# Patient Record
Sex: Female | Born: 1945 | Race: Black or African American | Hispanic: No | Marital: Married | Smoking: Never smoker
Health system: Southern US, Community
[De-identification: ages and names within clinical notes are randomized; demographics above are authoritative.]

## PROBLEM LIST (undated history)

## (undated) DIAGNOSIS — E785 Hyperlipidemia, unspecified: Secondary | ICD-10-CM

## (undated) DIAGNOSIS — I10 Essential (primary) hypertension: Secondary | ICD-10-CM

## (undated) DIAGNOSIS — M1990 Unspecified osteoarthritis, unspecified site: Secondary | ICD-10-CM

## (undated) DIAGNOSIS — K5790 Diverticulosis of intestine, part unspecified, without perforation or abscess without bleeding: Secondary | ICD-10-CM

## (undated) DIAGNOSIS — M8580 Other specified disorders of bone density and structure, unspecified site: Secondary | ICD-10-CM

## (undated) HISTORY — DX: Essential (primary) hypertension: I10

## (undated) HISTORY — DX: Other specified disorders of bone density and structure, unspecified site: M85.80

## (undated) HISTORY — DX: Hyperlipidemia, unspecified: E78.5

## (undated) HISTORY — DX: Diverticulosis of intestine, part unspecified, without perforation or abscess without bleeding: K57.90

---

## 1983-10-04 HISTORY — PX: ABDOMINAL HYSTERECTOMY: SHX81

## 2005-10-03 HISTORY — PX: CARPAL TUNNEL RELEASE: SHX101

## 2008-03-05 ENCOUNTER — Emergency Department (HOSPITAL_COMMUNITY): Admission: EM | Admit: 2008-03-05 | Discharge: 2008-03-05 | Payer: Self-pay | Admitting: Emergency Medicine

## 2008-10-03 HISTORY — PX: BREAST BIOPSY: SHX20

## 2008-10-31 ENCOUNTER — Ambulatory Visit (HOSPITAL_COMMUNITY): Admission: RE | Admit: 2008-10-31 | Discharge: 2008-10-31 | Payer: Self-pay | Admitting: Internal Medicine

## 2008-11-13 ENCOUNTER — Ambulatory Visit (HOSPITAL_COMMUNITY): Admission: RE | Admit: 2008-11-13 | Discharge: 2008-11-13 | Payer: Self-pay | Admitting: Internal Medicine

## 2008-11-17 ENCOUNTER — Encounter: Admission: RE | Admit: 2008-11-17 | Discharge: 2008-11-17 | Payer: Self-pay | Admitting: Family Medicine

## 2008-11-17 ENCOUNTER — Encounter (INDEPENDENT_AMBULATORY_CARE_PROVIDER_SITE_OTHER): Payer: Self-pay | Admitting: Diagnostic Radiology

## 2009-07-09 ENCOUNTER — Ambulatory Visit (HOSPITAL_COMMUNITY): Admission: RE | Admit: 2009-07-09 | Discharge: 2009-07-09 | Payer: Self-pay | Admitting: Family Medicine

## 2009-12-10 ENCOUNTER — Ambulatory Visit (HOSPITAL_COMMUNITY): Admission: RE | Admit: 2009-12-10 | Discharge: 2009-12-10 | Payer: Self-pay | Admitting: Family Medicine

## 2010-11-12 ENCOUNTER — Other Ambulatory Visit: Payer: Self-pay

## 2010-11-12 DIAGNOSIS — Z139 Encounter for screening, unspecified: Secondary | ICD-10-CM

## 2010-12-13 ENCOUNTER — Ambulatory Visit (HOSPITAL_COMMUNITY)
Admission: RE | Admit: 2010-12-13 | Discharge: 2010-12-13 | Disposition: A | Discharge status: Home / Self Care | Payer: Self-pay | Source: Ambulatory Visit | Attending: Family Medicine | Admitting: Family Medicine

## 2010-12-13 DIAGNOSIS — Z139 Encounter for screening, unspecified: Secondary | ICD-10-CM

## 2011-11-27 DIAGNOSIS — I10 Essential (primary) hypertension: Secondary | ICD-10-CM | POA: Diagnosis not present

## 2011-11-27 DIAGNOSIS — R51 Headache: Secondary | ICD-10-CM | POA: Diagnosis not present

## 2011-11-27 DIAGNOSIS — I9589 Other hypotension: Secondary | ICD-10-CM | POA: Diagnosis not present

## 2011-12-06 DIAGNOSIS — Z79899 Other long term (current) drug therapy: Secondary | ICD-10-CM | POA: Diagnosis not present

## 2011-12-06 DIAGNOSIS — E039 Hypothyroidism, unspecified: Secondary | ICD-10-CM | POA: Diagnosis not present

## 2011-12-06 DIAGNOSIS — I10 Essential (primary) hypertension: Secondary | ICD-10-CM | POA: Diagnosis not present

## 2011-12-06 DIAGNOSIS — M109 Gout, unspecified: Secondary | ICD-10-CM | POA: Diagnosis not present

## 2011-12-06 DIAGNOSIS — E559 Vitamin D deficiency, unspecified: Secondary | ICD-10-CM | POA: Diagnosis not present

## 2011-12-06 DIAGNOSIS — R7989 Other specified abnormal findings of blood chemistry: Secondary | ICD-10-CM | POA: Diagnosis not present

## 2011-12-06 DIAGNOSIS — E785 Hyperlipidemia, unspecified: Secondary | ICD-10-CM | POA: Diagnosis not present

## 2011-12-27 DIAGNOSIS — L0293 Carbuncle, unspecified: Secondary | ICD-10-CM | POA: Diagnosis not present

## 2011-12-27 DIAGNOSIS — L0292 Furuncle, unspecified: Secondary | ICD-10-CM | POA: Diagnosis not present

## 2011-12-27 DIAGNOSIS — M25819 Other specified joint disorders, unspecified shoulder: Secondary | ICD-10-CM | POA: Diagnosis not present

## 2011-12-30 DIAGNOSIS — L02419 Cutaneous abscess of limb, unspecified: Secondary | ICD-10-CM | POA: Diagnosis not present

## 2011-12-30 DIAGNOSIS — I10 Essential (primary) hypertension: Secondary | ICD-10-CM | POA: Diagnosis not present

## 2011-12-30 DIAGNOSIS — Z79899 Other long term (current) drug therapy: Secondary | ICD-10-CM | POA: Diagnosis not present

## 2012-01-02 DIAGNOSIS — L0292 Furuncle, unspecified: Secondary | ICD-10-CM | POA: Diagnosis not present

## 2012-01-02 DIAGNOSIS — L0293 Carbuncle, unspecified: Secondary | ICD-10-CM | POA: Diagnosis not present

## 2012-01-10 ENCOUNTER — Ambulatory Visit: Payer: Medicare Other | Attending: Family Medicine | Admitting: Physical Therapy

## 2012-01-10 DIAGNOSIS — M25519 Pain in unspecified shoulder: Secondary | ICD-10-CM | POA: Insufficient documentation

## 2012-01-10 DIAGNOSIS — IMO0001 Reserved for inherently not codable concepts without codable children: Secondary | ICD-10-CM | POA: Insufficient documentation

## 2012-01-10 DIAGNOSIS — R5381 Other malaise: Secondary | ICD-10-CM | POA: Diagnosis not present

## 2012-01-13 ENCOUNTER — Encounter: Payer: Medicare Other | Admitting: Physical Therapy

## 2012-01-16 ENCOUNTER — Encounter: Payer: Medicare Other | Admitting: Physical Therapy

## 2012-01-20 ENCOUNTER — Ambulatory Visit: Payer: Medicare Other | Admitting: Physical Therapy

## 2012-01-20 DIAGNOSIS — M25519 Pain in unspecified shoulder: Secondary | ICD-10-CM | POA: Diagnosis not present

## 2012-01-20 DIAGNOSIS — R5381 Other malaise: Secondary | ICD-10-CM | POA: Diagnosis not present

## 2012-01-20 DIAGNOSIS — IMO0001 Reserved for inherently not codable concepts without codable children: Secondary | ICD-10-CM | POA: Diagnosis not present

## 2012-01-23 ENCOUNTER — Ambulatory Visit: Payer: Medicare Other | Admitting: Physical Therapy

## 2012-01-23 DIAGNOSIS — IMO0001 Reserved for inherently not codable concepts without codable children: Secondary | ICD-10-CM | POA: Diagnosis not present

## 2012-01-23 DIAGNOSIS — R5381 Other malaise: Secondary | ICD-10-CM | POA: Diagnosis not present

## 2012-01-23 DIAGNOSIS — M25519 Pain in unspecified shoulder: Secondary | ICD-10-CM | POA: Diagnosis not present

## 2012-01-26 DIAGNOSIS — Z1231 Encounter for screening mammogram for malignant neoplasm of breast: Secondary | ICD-10-CM | POA: Diagnosis not present

## 2012-01-27 ENCOUNTER — Encounter: Payer: Medicare Other | Admitting: Physical Therapy

## 2012-02-03 ENCOUNTER — Ambulatory Visit: Payer: Medicare Other | Attending: Family Medicine | Admitting: Physical Therapy

## 2012-02-03 DIAGNOSIS — R5381 Other malaise: Secondary | ICD-10-CM | POA: Insufficient documentation

## 2012-02-03 DIAGNOSIS — IMO0001 Reserved for inherently not codable concepts without codable children: Secondary | ICD-10-CM | POA: Diagnosis not present

## 2012-02-03 DIAGNOSIS — M25519 Pain in unspecified shoulder: Secondary | ICD-10-CM | POA: Insufficient documentation

## 2012-02-06 ENCOUNTER — Ambulatory Visit: Payer: Medicare Other | Admitting: Physical Therapy

## 2012-02-10 ENCOUNTER — Ambulatory Visit: Payer: Medicare Other | Admitting: Physical Therapy

## 2012-02-13 ENCOUNTER — Encounter: Payer: Medicare Other | Admitting: Physical Therapy

## 2012-02-15 DIAGNOSIS — L0390 Cellulitis, unspecified: Secondary | ICD-10-CM | POA: Diagnosis not present

## 2012-02-15 DIAGNOSIS — L0291 Cutaneous abscess, unspecified: Secondary | ICD-10-CM | POA: Diagnosis not present

## 2012-02-17 ENCOUNTER — Encounter: Payer: Medicare Other | Admitting: Physical Therapy

## 2012-02-20 ENCOUNTER — Ambulatory Visit: Payer: Medicare Other | Admitting: Physical Therapy

## 2012-02-23 DIAGNOSIS — L0390 Cellulitis, unspecified: Secondary | ICD-10-CM | POA: Diagnosis not present

## 2012-02-23 DIAGNOSIS — L0291 Cutaneous abscess, unspecified: Secondary | ICD-10-CM | POA: Diagnosis not present

## 2012-02-24 ENCOUNTER — Ambulatory Visit: Payer: Medicare Other | Admitting: *Deleted

## 2012-03-02 ENCOUNTER — Encounter: Payer: Medicare Other | Admitting: *Deleted

## 2012-03-05 ENCOUNTER — Ambulatory Visit: Payer: Medicare Other | Attending: Family Medicine | Admitting: Physical Therapy

## 2012-03-05 DIAGNOSIS — R5381 Other malaise: Secondary | ICD-10-CM | POA: Diagnosis not present

## 2012-03-05 DIAGNOSIS — M25519 Pain in unspecified shoulder: Secondary | ICD-10-CM | POA: Diagnosis not present

## 2012-03-05 DIAGNOSIS — IMO0001 Reserved for inherently not codable concepts without codable children: Secondary | ICD-10-CM | POA: Diagnosis not present

## 2012-03-16 ENCOUNTER — Ambulatory Visit: Payer: Medicare Other | Admitting: Physical Therapy

## 2012-03-19 ENCOUNTER — Ambulatory Visit: Payer: Medicare Other | Admitting: Physical Therapy

## 2012-04-02 LAB — FECAL OCCULT BLOOD, GUAIAC: Fecal Occult Blood: NEGATIVE

## 2012-04-26 DIAGNOSIS — Z0000 Encounter for general adult medical examination without abnormal findings: Secondary | ICD-10-CM | POA: Diagnosis not present

## 2012-04-26 DIAGNOSIS — I10 Essential (primary) hypertension: Secondary | ICD-10-CM | POA: Diagnosis not present

## 2012-04-26 DIAGNOSIS — D649 Anemia, unspecified: Secondary | ICD-10-CM | POA: Diagnosis not present

## 2012-04-26 DIAGNOSIS — Z124 Encounter for screening for malignant neoplasm of cervix: Secondary | ICD-10-CM | POA: Diagnosis not present

## 2012-04-26 DIAGNOSIS — E559 Vitamin D deficiency, unspecified: Secondary | ICD-10-CM | POA: Diagnosis not present

## 2012-04-26 DIAGNOSIS — N760 Acute vaginitis: Secondary | ICD-10-CM | POA: Diagnosis not present

## 2012-04-26 DIAGNOSIS — N390 Urinary tract infection, site not specified: Secondary | ICD-10-CM | POA: Diagnosis not present

## 2012-04-26 DIAGNOSIS — E785 Hyperlipidemia, unspecified: Secondary | ICD-10-CM | POA: Diagnosis not present

## 2012-05-21 DIAGNOSIS — Z800 Family history of malignant neoplasm of digestive organs: Secondary | ICD-10-CM | POA: Diagnosis not present

## 2012-05-24 DIAGNOSIS — G5600 Carpal tunnel syndrome, unspecified upper limb: Secondary | ICD-10-CM | POA: Diagnosis not present

## 2012-05-24 DIAGNOSIS — I10 Essential (primary) hypertension: Secondary | ICD-10-CM | POA: Diagnosis not present

## 2012-05-29 DIAGNOSIS — Z1211 Encounter for screening for malignant neoplasm of colon: Secondary | ICD-10-CM | POA: Diagnosis not present

## 2012-05-29 DIAGNOSIS — E785 Hyperlipidemia, unspecified: Secondary | ICD-10-CM | POA: Diagnosis not present

## 2012-05-29 DIAGNOSIS — Z800 Family history of malignant neoplasm of digestive organs: Secondary | ICD-10-CM | POA: Diagnosis not present

## 2012-05-29 DIAGNOSIS — I10 Essential (primary) hypertension: Secondary | ICD-10-CM | POA: Diagnosis not present

## 2012-05-29 DIAGNOSIS — Z79899 Other long term (current) drug therapy: Secondary | ICD-10-CM | POA: Diagnosis not present

## 2012-05-29 DIAGNOSIS — K5730 Diverticulosis of large intestine without perforation or abscess without bleeding: Secondary | ICD-10-CM | POA: Diagnosis not present

## 2012-06-11 DIAGNOSIS — I10 Essential (primary) hypertension: Secondary | ICD-10-CM | POA: Diagnosis not present

## 2012-06-11 DIAGNOSIS — E785 Hyperlipidemia, unspecified: Secondary | ICD-10-CM | POA: Diagnosis not present

## 2012-06-12 DIAGNOSIS — E559 Vitamin D deficiency, unspecified: Secondary | ICD-10-CM | POA: Diagnosis not present

## 2012-06-12 DIAGNOSIS — E785 Hyperlipidemia, unspecified: Secondary | ICD-10-CM | POA: Diagnosis not present

## 2012-06-12 DIAGNOSIS — I10 Essential (primary) hypertension: Secondary | ICD-10-CM | POA: Diagnosis not present

## 2012-06-15 DIAGNOSIS — G609 Hereditary and idiopathic neuropathy, unspecified: Secondary | ICD-10-CM | POA: Diagnosis not present

## 2012-06-15 DIAGNOSIS — M79609 Pain in unspecified limb: Secondary | ICD-10-CM | POA: Diagnosis not present

## 2012-08-03 LAB — HM DEXA SCAN

## 2012-08-06 DIAGNOSIS — G5600 Carpal tunnel syndrome, unspecified upper limb: Secondary | ICD-10-CM | POA: Diagnosis not present

## 2012-08-06 DIAGNOSIS — I10 Essential (primary) hypertension: Secondary | ICD-10-CM | POA: Diagnosis not present

## 2012-08-07 DIAGNOSIS — Z23 Encounter for immunization: Secondary | ICD-10-CM | POA: Diagnosis not present

## 2012-08-07 DIAGNOSIS — R109 Unspecified abdominal pain: Secondary | ICD-10-CM | POA: Diagnosis not present

## 2012-08-08 DIAGNOSIS — M899 Disorder of bone, unspecified: Secondary | ICD-10-CM | POA: Diagnosis not present

## 2012-08-08 DIAGNOSIS — M949 Disorder of cartilage, unspecified: Secondary | ICD-10-CM | POA: Diagnosis not present

## 2012-10-16 DIAGNOSIS — M542 Cervicalgia: Secondary | ICD-10-CM | POA: Diagnosis not present

## 2012-10-16 DIAGNOSIS — M25519 Pain in unspecified shoulder: Secondary | ICD-10-CM | POA: Diagnosis not present

## 2013-01-03 ENCOUNTER — Telehealth: Payer: Self-pay | Admitting: Physician Assistant

## 2013-01-03 NOTE — Telephone Encounter (Signed)
appt made

## 2013-01-04 ENCOUNTER — Encounter: Payer: Self-pay | Admitting: *Deleted

## 2013-01-04 ENCOUNTER — Ambulatory Visit (INDEPENDENT_AMBULATORY_CARE_PROVIDER_SITE_OTHER): Payer: Medicare Other | Admitting: Physician Assistant

## 2013-01-04 ENCOUNTER — Other Ambulatory Visit: Payer: Self-pay | Admitting: *Deleted

## 2013-01-04 ENCOUNTER — Encounter: Payer: Self-pay | Admitting: Physician Assistant

## 2013-01-04 VITALS — BP 115/75 | HR 95 | Temp 97.8°F | Ht 64.0 in | Wt 200.0 lb

## 2013-01-04 DIAGNOSIS — M542 Cervicalgia: Secondary | ICD-10-CM

## 2013-01-04 DIAGNOSIS — Z780 Asymptomatic menopausal state: Secondary | ICD-10-CM

## 2013-01-04 MED ORDER — TRAMADOL HCL 50 MG PO TABS: 50.0000 mg | ORAL_TABLET | Freq: Three times a day (TID) | ORAL | Status: DC | PRN

## 2013-01-04 NOTE — Progress Notes (Signed)
  Subjective:    Patient ID: Ariel [name], female    DOB: 01/10/1946, 67 y.o.   MRN: [id number]  HPI Left neck and shoulder pain, making it difficult to sleep   Review of Systems  Musculoskeletal: Positive for back pain.  All other systems reviewed and are negative.       Objective:   Physical Exam  Vitals reviewed. Constitutional: She is oriented to person, place, and time. She appears well-developed and well-nourished.  HENT:  Head: Normocephalic and atraumatic.  Musculoskeletal:  Left shoulder abduction 75-80 degrees Trapezial muscle tightness  Neurological: She is alert and oriented to person, place, and time.          Assessment & Plan:  Cervicalgia - Plan: traMADol (ULTRAM) 50 MG tablet

## 2013-01-29 DIAGNOSIS — M25519 Pain in unspecified shoulder: Secondary | ICD-10-CM | POA: Diagnosis not present

## 2013-01-29 DIAGNOSIS — M6740 Ganglion, unspecified site: Secondary | ICD-10-CM | POA: Diagnosis not present

## 2013-01-29 DIAGNOSIS — G5600 Carpal tunnel syndrome, unspecified upper limb: Secondary | ICD-10-CM | POA: Diagnosis not present

## 2013-02-05 ENCOUNTER — Other Ambulatory Visit: Payer: Self-pay | Admitting: Family Medicine

## 2013-02-06 NOTE — Telephone Encounter (Signed)
LAST LABS 9/13 

## 2013-02-20 DIAGNOSIS — Z1231 Encounter for screening mammogram for malignant neoplasm of breast: Secondary | ICD-10-CM | POA: Diagnosis not present

## 2013-02-22 DIAGNOSIS — G609 Hereditary and idiopathic neuropathy, unspecified: Secondary | ICD-10-CM | POA: Diagnosis not present

## 2013-02-22 DIAGNOSIS — M79609 Pain in unspecified limb: Secondary | ICD-10-CM | POA: Diagnosis not present

## 2013-03-20 ENCOUNTER — Ambulatory Visit (INDEPENDENT_AMBULATORY_CARE_PROVIDER_SITE_OTHER): Payer: Medicare Other | Admitting: Family Medicine

## 2013-03-20 ENCOUNTER — Encounter: Payer: Self-pay | Admitting: Family Medicine

## 2013-03-20 VITALS — BP 118/76 | HR 77 | Temp 97.0°F | Wt 198.0 lb

## 2013-03-20 DIAGNOSIS — I10 Essential (primary) hypertension: Secondary | ICD-10-CM | POA: Diagnosis not present

## 2013-03-20 DIAGNOSIS — E669 Obesity, unspecified: Secondary | ICD-10-CM | POA: Insufficient documentation

## 2013-03-20 DIAGNOSIS — E8881 Metabolic syndrome: Secondary | ICD-10-CM

## 2013-03-20 DIAGNOSIS — R635 Abnormal weight gain: Secondary | ICD-10-CM | POA: Diagnosis not present

## 2013-03-20 DIAGNOSIS — E785 Hyperlipidemia, unspecified: Secondary | ICD-10-CM | POA: Insufficient documentation

## 2013-03-20 LAB — COMPLETE METABOLIC PANEL WITH GFR
ALT: 20 U/L (ref 0–35)
AST: 22 U/L (ref 0–37)
Albumin: 4.2 g/dL (ref 3.5–5.2)
Alkaline Phosphatase: 71 U/L (ref 39–117)
BUN: 16 mg/dL (ref 6–23)
CO2: 28 mEq/L (ref 19–32)
Calcium: 9.8 mg/dL (ref 8.4–10.5)
Chloride: 104 mEq/L (ref 96–112)
Creat: 0.63 mg/dL (ref 0.50–1.10)
GFR, Est African American: >89 mL/min
GFR, Est Non African American: >89 mL/min
Glucose, Bld: 94 mg/dL (ref 70–99)
Potassium: 4.7 mEq/L (ref 3.5–5.3)
Sodium: 140 mEq/L (ref 135–145)
Total Bilirubin: 0.3 mg/dL (ref 0.3–1.2)
Total Protein: 7.2 g/dL (ref 6.0–8.3)

## 2013-03-20 NOTE — Progress Notes (Signed)
Patient ID: Ariel [name], female   DOB: [birth year], 67 y.o.   MRN: [id number] SUBJECTIVE: CC: Chief Complaint  Patient presents with  . Follow-up    3 month follow up    HPI: Breakfast: toast, coffee Lunch:salad Supper: fish or chicken, green beans or greens.  1. Patient is here for follow up of hypertension:  denies Headache;deniesChest Pain;denies weakness;denies Shortness of Breath or Orthopnea;denies Visual changes;denies palpitations;denies cough;denies pedal edema;denies symptoms of TIA or stroke; admits to Compliance with medications. denies Problems with medications.  2.Patient is here for follow up of hyperlipidemia:  denies Headache;denies Chest Pain;denies weakness;denies Shortness of Breath and orthopnea;denies Visual changes;denies palpitations;denies cough;denies pedal edema;denies symptoms of TIA or stroke;deniesClaudication symptoms. admits to Compliance with medications; denies Problems with medications.    PMH/PSH: reviewed/updated in Epic  SH/FH: reviewed/updated in Epic  Allergies: reviewed/updated in Epic  Medications: reviewed/updated in Epic  Immunizations: reviewed/updated in Epic  ROS: As above in the HPI. All other systems are stable or negative.  OBJECTIVE: APPEARANCE:  Patient in no acute distress.The patient appeared well nourished and normally developed. Acyanotic. Waist: 41 3/4 inches VITAL SIGNS:BP 118/76  Pulse 77  Temp(Src) 97 F (36.1 C) (Oral)  Wt 198 lb (89.812 kg)  BMI 33.97 kg/m2 AAF  SKIN: warm and  Dry without overt rashes, tattoos and scars  HEAD and Neck: without JVD, Head and scalp: normal Eyes:No scleral icterus. Fundi normal, eye movements normal. Ears: Auricle normal, canal normal, Tympanic membranes normal, insufflation normal. Nose: normal Throat: normal Neck & thyroid: normal  CHEST & LUNGS: Chest wall: normal Lungs: Clear  [facility]: Reveals the PMI to be normally located. Regular rhythm, First and Second  Heart sounds are normal,  absence of murmurs, rubs or gallops. Peripheral vasculature: Radial pulses: normal Dorsal pedis pulses: normal Posterior pulses: normal  ABDOMEN:  Appearance:obese Benign, no organomegaly, no masses, no Abdominal Aortic enlargement. No Guarding , no rebound. No Bruits. Bowel sounds: normal  RECTAL: N/A GU: N/A  EXTREMETIES: nonedematous. Both Femoral and Pedal pulses are normal.  MUSCULOSKELETAL:  Spine: normal Joints: intact  NEUROLOGIC: oriented to time,place and person; nonfocal. Strength is normal Sensory is normal Reflexes are normal Cranial Nerves are normal.  ASSESSMENT: HTN (hypertension) - Plan: COMPLETE METABOLIC PANEL WITH GFR  HLD (hyperlipidemia) - Plan: COMPLETE METABOLIC PANEL WITH GFR, NMR Lipoprofile with Lipids  Metabolic syndrome  Obesity, unspecified   PLAN: Orders Placed This Encounter  Procedures  . COMPLETE METABOLIC PANEL WITH GFR  . NMR Lipoprofile with Lipids   Results for orders placed in visit on 01/04/13  HM DEXA SCAN      Result Value Range   HM Dexa Scan done    FECAL OCCULT BLOOD, GUAIAC      Result Value Range   Fecal Occult Blood Negative          Dr Woodroe Mode Recommendations  Diet and Exercise discussed with patient.  For nutrition information, I recommend books:  1).Eat to Live by Dr Monico Hoar. 2).Prevent and Reverse Heart Disease by Dr Suzzette Righter. 3) Dr Katherina Right Book:  Program to Reverse Diabetes  Exercise recommendations are:  If unable to walk, then the patient can exercise in a chair 3 times a day. By flapping arms like a bird gently and raising legs outwards to the front.  If ambulatory, the patient can go for walks for 30 minutes 3 times a week. Then increase the intensity and duration as tolerated.  Goal is to try to attain exercise  frequency to 5 times a week.  If applicable: Best to perform resistance exercises (machines or weights) 2 days a week and  cardio type exercises 3 days per week.  Return in about 4 months (around 07/20/2013) for Recheck medical problems.  Samar Venneman P. Modesto Charon, M.D.

## 2013-03-20 NOTE — Patient Instructions (Addendum)
      Dr Yazlynn Birkeland's Recommendations  Diet and Exercise discussed with patient.  For nutrition information, I recommend books:  1).Eat to Live by Dr Joel Fuhrman. 2).Prevent and Reverse Heart Disease by Dr Caldwell Esselstyn. 3) Dr Neal Barnard's Book:  Program to Reverse Diabetes  Exercise recommendations are:  If unable to walk, then the patient can exercise in a chair 3 times a day. By flapping arms like a bird gently and raising legs outwards to the front.  If ambulatory, the patient can go for walks for 30 minutes 3 times a week. Then increase the intensity and duration as tolerated.  Goal is to try to attain exercise frequency to 5 times a week.  If applicable: Best to perform resistance exercises (machines or weights) 2 days a week and cardio type exercises 3 days per week.  

## 2013-03-22 LAB — NMR LIPOPROFILE WITH LIPIDS
Cholesterol, Total: 156 mg/dL (ref <200)
HDL Particle Number: 37.7 umol/L (ref >=30.5)
HDL Size: 8.5 nm — ABNORMAL LOW (ref >=9.2)
HDL-C: 50 mg/dL (ref >=40)
LDL (calc): 84 mg/dL (ref <100)
LDL Particle Number: 1183 nmol/L — ABNORMAL HIGH (ref <1000)
LDL Size: 20.4 nm — ABNORMAL LOW (ref >20.5)
LP-IR Score: 69 — ABNORMAL HIGH (ref <=45)
Large HDL-P: 3.2 umol/L — ABNORMAL LOW (ref >=4.8)
Large VLDL-P: 3.2 nmol/L — ABNORMAL HIGH (ref <=2.7)
Small LDL Particle Number: 644 nmol/L — ABNORMAL HIGH (ref <=527)
Triglycerides: 110 mg/dL (ref <150)
VLDL Size: 48.1 nm — ABNORMAL HIGH (ref <=46.6)

## 2013-03-22 NOTE — Progress Notes (Signed)
Quick Note:  Lab result at goal.or close to goal. Would like to see the LDLc a little lower with dietary changes. No change in Medications for now. No Change in plans and follow up. ______

## 2013-04-03 ENCOUNTER — Other Ambulatory Visit: Payer: Self-pay | Admitting: Family Medicine

## 2013-05-06 ENCOUNTER — Encounter: Payer: Self-pay | Admitting: *Deleted

## 2013-06-17 ENCOUNTER — Encounter: Payer: Self-pay | Admitting: *Deleted

## 2013-06-19 ENCOUNTER — Telehealth: Payer: Self-pay | Admitting: Family Medicine

## 2013-06-19 NOTE — Telephone Encounter (Signed)
Pt noticed blood in her stool 2-3 days ago [facility] Appt scheduled

## 2013-06-20 ENCOUNTER — Ambulatory Visit (INDEPENDENT_AMBULATORY_CARE_PROVIDER_SITE_OTHER): Payer: Medicare Other

## 2013-06-20 ENCOUNTER — Encounter: Payer: Self-pay | Admitting: Nurse Practitioner

## 2013-06-20 ENCOUNTER — Ambulatory Visit (INDEPENDENT_AMBULATORY_CARE_PROVIDER_SITE_OTHER): Payer: Medicare Other | Admitting: Nurse Practitioner

## 2013-06-20 VITALS — BP 120/75 | HR 88 | Temp 97.8°F | Ht 64.5 in | Wt 194.0 lb

## 2013-06-20 DIAGNOSIS — D500 Iron deficiency anemia secondary to blood loss (chronic): Secondary | ICD-10-CM

## 2013-06-20 DIAGNOSIS — M25519 Pain in unspecified shoulder: Secondary | ICD-10-CM

## 2013-06-20 DIAGNOSIS — K625 Hemorrhage of anus and rectum: Secondary | ICD-10-CM | POA: Diagnosis not present

## 2013-06-20 DIAGNOSIS — M25512 Pain in left shoulder: Secondary | ICD-10-CM

## 2013-06-20 LAB — POCT HEMOGLOBIN: Hemoglobin: 11.5 g/dL — AB (ref 12.2–16.2)

## 2013-06-20 MED ORDER — ROSUVASTATIN CALCIUM 10 MG PO TABS: 10.0000 mg | ORAL_TABLET | Freq: Every day | ORAL | Status: DC

## 2013-06-20 MED ORDER — HEMOCYTE PLUS 106-1 MG PO CAPS: 1.0000 | ORAL_CAPSULE | Freq: Two times a day (BID) | ORAL | Status: DC

## 2013-06-20 NOTE — Progress Notes (Signed)
  Subjective:    Patient ID: Ariel [name], female    DOB: Mar 31, 1946, 67 y.o.   MRN: [id number]  HPI1. Patient in c/o blood in stool- noticed it 2 days ago- bright red- Had colonoscopy in August of last year which was normal- (+) diverticulosis- Dr. Teena Dunk. 2. 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    Review of Systems  All other systems reviewed and are negative.       Objective:   Physical Exam  Constitutional: She appears well-developed and well-nourished.  Cardiovascular: Normal rate, regular rhythm and normal heart sounds.   Pulmonary/Chest: Effort normal and breath sounds normal.  Abdominal: Soft. Bowel sounds are normal. She exhibits no mass. There is no tenderness. There is no rebound and no guarding.  Musculoskeletal:  FROM of left shoulder limited due to pain on ext and flexion. DTR,s = bil Motor strength and sensation intact distally  Neurological: She has normal reflexes.    BP 120/75  Pulse 88  Temp(Src) 97.8 F (36.6 C) (Oral)  Ht 5' 4.5" (1.638 m)  Wt 194 lb (87.998 kg)  BMI 32.8 kg/m2 Results for orders placed in visit on 06/20/13  POCT HEMOGLOBIN      Result Value Range   Hemoglobin 11.5 (*) 12.2 - 16.2 g/dL    BP 409/81  Pulse 88  Temp(Src) 97.8 F (36.6 C) (Oral)  Ht 5' 4.5" (1.638 m)  Wt 194 lb (87.998 kg)  BMI 32.8 kg/m2      Assessment & Plan:  1. Left shoulder pain If hurts don't do it until sees ortho - DG Shoulder Left; Future - Ambulatory referral to Orthopedic Surgery  2. Rectal bleeding Stool softners OTC - POCT hemoglobin - Ambulatory referral to Gastroenterology  3. Anemia due to chronic blood loss Recheck Hgb in 2 weeks - Fe Fum-FA-B Cmp-C-Zn-Mg-Mn-Cu (HEMOCYTE PLUS) 106-1 MG CAPS; Take 1 tablet by mouth 2 (two) times daily.  Dispense: 30 each; Refill: 2  Refilled crestor- patient to make appointment for routine  follow up  Mary-Margaret Daphine Deutscher, FNP

## 2013-06-20 NOTE — Patient Instructions (Addendum)
Left shoulder pain If hurts don't do it until sees ortho - DG Shoulder Left; Future - Ambulatory referral to Orthopedic Surgery  2. Rectal bleeding Stool softners OTC - POCT hemoglobin - Ambulatory referral to Gastroenterology  3. Anemia due to chronic blood loss Recheck Hgb in 2 weeks - Fe Fum-FA-B Cmp-C-Zn-Mg-Mn-Cu (HEMOCYTE PLUS) 106-1 MG CAPS; Take 1 tablet by mouth 2 (two) times daily.  Dispense: 30 each; Refill: 2  Refilled crestor- patient to make appointment for routine follow up  Mary-Margaret Daphine Deutscher, FNP

## 2013-06-21 ENCOUNTER — Telehealth: Payer: Self-pay | Admitting: *Deleted

## 2013-06-21 ENCOUNTER — Telehealth: Payer: Self-pay | Admitting: Nurse Practitioner

## 2013-06-21 NOTE — Telephone Encounter (Signed)
Nothing else cheaper that doesn't cause constipation- so need to go bak on oTC daily an dtake miralax if gets constipated

## 2013-06-21 NOTE — Telephone Encounter (Signed)
Pt notified that [facility] is checking on Iron supplement that will be cheaper Pt verbalizes understanding

## 2013-06-21 NOTE — Telephone Encounter (Signed)
Pt notified of results Verbalizes understanding 

## 2013-06-21 NOTE — Telephone Encounter (Signed)
Message copied by Bearl Mulberry on Fri Jun 21, 2013  5:27 PM ------      Message from: Bennie Pierini      Created: Thu Jun 20, 2013 10:27 PM       Degenerative shanges left shoulder- if doesn't get better will need ortho referral ------

## 2013-07-02 ENCOUNTER — Telehealth: Payer: Self-pay | Admitting: Nurse Practitioner

## 2013-07-04 ENCOUNTER — Ambulatory Visit: Payer: Medicare Other | Admitting: Nurse Practitioner

## 2013-07-10 ENCOUNTER — Telehealth: Payer: Self-pay | Admitting: Nurse Practitioner

## 2013-07-17 ENCOUNTER — Ambulatory Visit: Payer: Medicare Other | Admitting: Family Medicine

## 2013-07-18 ENCOUNTER — Ambulatory Visit (INDEPENDENT_AMBULATORY_CARE_PROVIDER_SITE_OTHER): Payer: Medicare Other | Admitting: Family Medicine

## 2013-07-18 ENCOUNTER — Encounter: Payer: Self-pay | Admitting: Family Medicine

## 2013-07-18 ENCOUNTER — Ambulatory Visit: Payer: Medicare Other | Admitting: Family Medicine

## 2013-07-18 VITALS — BP 146/89 | HR 90 | Temp 97.6°F | Ht 64.5 in | Wt 195.0 lb

## 2013-07-18 DIAGNOSIS — K625 Hemorrhage of anus and rectum: Secondary | ICD-10-CM | POA: Diagnosis not present

## 2013-07-18 NOTE — Progress Notes (Signed)
Patient ID: Gean Birchwood, female   DOB: 1946/06/16, 67 y.o.   MRN: [id number] Not seen Patient couldn't wait She left Oni Dietzman P. [place] Charon, M.D.

## 2013-07-23 ENCOUNTER — Ambulatory Visit: Payer: Medicare Other | Admitting: Family Medicine

## 2013-07-25 ENCOUNTER — Encounter: Payer: Self-pay | Admitting: General Practice

## 2013-07-25 ENCOUNTER — Ambulatory Visit: Payer: Medicare Other | Admitting: General Practice

## 2013-07-25 ENCOUNTER — Ambulatory Visit (INDEPENDENT_AMBULATORY_CARE_PROVIDER_SITE_OTHER): Payer: Medicare Other | Admitting: General Practice

## 2013-07-25 ENCOUNTER — Encounter (INDEPENDENT_AMBULATORY_CARE_PROVIDER_SITE_OTHER): Payer: Self-pay

## 2013-07-25 VITALS — BP 123/71 | HR 79 | Temp 97.7°F | Wt 198.0 lb

## 2013-07-25 DIAGNOSIS — E785 Hyperlipidemia, unspecified: Secondary | ICD-10-CM | POA: Diagnosis not present

## 2013-07-25 DIAGNOSIS — I10 Essential (primary) hypertension: Secondary | ICD-10-CM

## 2013-07-25 DIAGNOSIS — D649 Anemia, unspecified: Secondary | ICD-10-CM | POA: Diagnosis not present

## 2013-07-25 LAB — POCT CBC
HCT, POC: 39 % (ref 37.7–47.9)
Hemoglobin: 12.8 g/dL (ref 12.2–16.2)
MCH, POC: 27.1 pg (ref 27–31.2)
MCV: 82.5 fL (ref 80–97)
WBC: 6.2 10*3/uL (ref 4.6–10.2)

## 2013-07-25 MED ORDER — VALSARTAN-HYDROCHLOROTHIAZIDE 160-25 MG PO TABS: 1.0000 | ORAL_TABLET | Freq: Every day | ORAL | Status: DC

## 2013-07-25 MED ORDER — ROSUVASTATIN CALCIUM 10 MG PO TABS: 10.0000 mg | ORAL_TABLET | Freq: Every day | ORAL | Status: DC

## 2013-07-25 MED ORDER — VALSARTAN-HYDROCHLOROTHIAZIDE 160-25 MG PO TABS: ORAL_TABLET | ORAL | Status: DC

## 2013-07-25 NOTE — Progress Notes (Signed)
  Subjective:    Patient ID: Ariel [name], female    DOB: 29-Apr-1946, 67 y.o.   MRN: [id number]  HPI Patient presents today for follow up of low hemoglobin and chronic health condition hypertension, hyperlipidemia. She reports taking medications as directed. Reports eating healthy, but denies regular exercise. Denies checking blood pressures at home.    Review of Systems  Constitutional: Negative for fever and chills.  Respiratory: Negative for cough, chest tightness, shortness of breath and wheezing.   Cardiovascular: Negative for chest pain and palpitations.  Gastrointestinal: Negative for abdominal pain, diarrhea, constipation and blood in stool.  Genitourinary: Negative for dysuria, hematuria and difficulty urinating.  Musculoskeletal: Negative for back pain, neck pain and neck stiffness.  Neurological: Negative for dizziness, weakness and headaches.  All other systems reviewed and are negative.       Objective:   Physical Exam  Constitutional: She is oriented to person, place, and time. She appears well-developed and well-nourished.  HENT:  Head: Normocephalic and atraumatic.  Right Ear: External ear normal.  Left Ear: External ear normal.  Nose: Nose normal.  Mouth/Throat: Oropharynx is clear and moist.  Eyes: EOM are normal. Pupils are equal, round, and reactive to light.  Neck: Normal range of motion. Neck supple. No thyromegaly present.  Cardiovascular: Normal rate, regular rhythm and normal heart sounds.   Pulmonary/Chest: Effort normal and breath sounds normal. No respiratory distress. She exhibits no tenderness.  Abdominal: Soft. Bowel sounds are normal. She exhibits no distension. There is no tenderness.  Musculoskeletal: She exhibits no edema and no tenderness.  Lymphadenopathy:    She has no cervical adenopathy.  Neurological: She is alert and oriented to person, place, and time.  Skin: Skin is warm and dry.  Psychiatric: She has a normal mood and affect.           Assessment & Plan:  1. Low hemoglobin  - POCT CBC  2. Hyperlipidemia  - rosuvastatin (CRESTOR) 10 MG tablet; Take 1 tablet (10 mg total) by mouth daily.  Dispense: 30 tablet; Refill: 3 - Lipid panel  3. Hypertension  - CMP14+EGFR - valsartan-hydrochlorothiazide (DIOVAN-HCT) 160-25 MG per tablet; Take 1 tablet by mouth daily. Take 1/2 daily  Dispense: 30 tablet; Refill: 3 -Continue all current medications Labs pending F/u in 3 months Discussed exercise and diet  Patient verbalized understanding Coralie Keens, FNP-C

## 2013-07-25 NOTE — Telephone Encounter (Signed)
Seen in office today for this problem.

## 2013-07-25 NOTE — Patient Instructions (Signed)

## 2013-07-26 LAB — CMP14+EGFR
ALT: 28 IU/L (ref 0–32)
AST: 24 IU/L (ref 0–40)
Albumin: 4.5 g/dL (ref 3.6–4.8)
Alkaline Phosphatase: 81 IU/L (ref 39–117)
CO2: 28 mmol/L (ref 18–29)
Calcium: 10.2 mg/dL (ref 8.6–10.2)
Potassium: 5 mmol/L (ref 3.5–5.2)
Sodium: 139 mmol/L (ref 134–144)
Total Protein: 7.1 g/dL (ref 6.0–8.5)

## 2013-07-26 LAB — LIPID PANEL
Chol/HDL Ratio: 3.4 ratio units (ref 0.0–4.4)
Cholesterol, Total: 186 mg/dL (ref 100–199)
HDL: 55 mg/dL (ref >39)
Triglycerides: 140 mg/dL (ref 0–149)
VLDL Cholesterol Cal: 28 mg/dL (ref 5–40)

## 2013-07-31 DIAGNOSIS — M25519 Pain in unspecified shoulder: Secondary | ICD-10-CM | POA: Diagnosis not present

## 2013-08-21 DIAGNOSIS — R209 Unspecified disturbances of skin sensation: Secondary | ICD-10-CM | POA: Diagnosis not present

## 2013-08-21 DIAGNOSIS — G5600 Carpal tunnel syndrome, unspecified upper limb: Secondary | ICD-10-CM | POA: Diagnosis not present

## 2013-08-23 DIAGNOSIS — G609 Hereditary and idiopathic neuropathy, unspecified: Secondary | ICD-10-CM | POA: Diagnosis not present

## 2013-08-23 DIAGNOSIS — M79609 Pain in unspecified limb: Secondary | ICD-10-CM | POA: Diagnosis not present

## 2013-09-17 ENCOUNTER — Ambulatory Visit (INDEPENDENT_AMBULATORY_CARE_PROVIDER_SITE_OTHER): Payer: Medicare Other | Admitting: General Practice

## 2013-09-17 ENCOUNTER — Encounter: Payer: Self-pay | Admitting: General Practice

## 2013-09-17 ENCOUNTER — Telehealth: Payer: Self-pay | Admitting: General Practice

## 2013-09-17 VITALS — BP 106/64 | HR 87 | Temp 98.0°F | Ht 64.5 in | Wt 192.0 lb

## 2013-09-17 DIAGNOSIS — R05 Cough: Secondary | ICD-10-CM | POA: Diagnosis not present

## 2013-09-17 DIAGNOSIS — J101 Influenza due to other identified influenza virus with other respiratory manifestations: Secondary | ICD-10-CM

## 2013-09-17 DIAGNOSIS — L089 Local infection of the skin and subcutaneous tissue, unspecified: Secondary | ICD-10-CM

## 2013-09-17 DIAGNOSIS — J029 Acute pharyngitis, unspecified: Secondary | ICD-10-CM | POA: Diagnosis not present

## 2013-09-17 DIAGNOSIS — Z20828 Contact with and (suspected) exposure to other viral communicable diseases: Secondary | ICD-10-CM | POA: Diagnosis not present

## 2013-09-17 DIAGNOSIS — J111 Influenza due to unidentified influenza virus with other respiratory manifestations: Secondary | ICD-10-CM

## 2013-09-17 DIAGNOSIS — R059 Cough, unspecified: Secondary | ICD-10-CM

## 2013-09-17 LAB — POCT INFLUENZA A/B: Influenza A, POC: POSITIVE

## 2013-09-17 MED ORDER — CEPHALEXIN 500 MG PO CAPS: 500.0000 mg | ORAL_CAPSULE | Freq: Two times a day (BID) | ORAL | Status: DC

## 2013-09-17 MED ORDER — OSELTAMIVIR PHOSPHATE 75 MG PO CAPS: 75.0000 mg | ORAL_CAPSULE | Freq: Two times a day (BID) | ORAL | Status: DC

## 2013-09-17 NOTE — Progress Notes (Signed)
   Subjective:    Patient ID: Ariel [name], female    DOB: 06/22/1946, 67 y.o.   MRN: [id number]  Cough This is a new problem. The current episode started in the past 7 days. The problem has been unchanged. The cough is productive of sputum. Associated symptoms include nasal congestion and a sore throat. Pertinent negatives include no chest pain, chills, ear congestion, fever, headaches, myalgias, postnasal drip, shortness of breath or wheezing. Nothing aggravates the symptoms. Treatments tried: cold liquid meds. There is no history of asthma, bronchitis or pneumonia.  Reports spending time with her daughter and while visiting, her daughter was diagnosed with the flu. Also reports a boil to labia majora that has drained.     Review of Systems  Constitutional: Negative for fever and chills.  HENT: Positive for congestion and sore throat. Negative for postnasal drip, sinus pressure and sneezing.   Respiratory: Positive for cough. Negative for shortness of breath and wheezing.   Cardiovascular: Negative for chest pain and palpitations.  Gastrointestinal: Negative for abdominal pain.  Genitourinary: Negative for difficulty urinating.  Musculoskeletal: Negative for myalgias.  Neurological: Negative for dizziness, weakness and headaches.       Objective:   Physical Exam  Constitutional: She is oriented to person, place, and time. She appears well-developed and well-nourished.  HENT:  Head: Normocephalic and atraumatic.  Right Ear: External ear normal.  Left Ear: External ear normal.  Eyes: Pupils are equal, round, and reactive to light.  Cardiovascular: Normal rate, regular rhythm and normal heart sounds.   Pulmonary/Chest: Effort normal and breath sounds normal. No respiratory distress. She exhibits no tenderness.  Neurological: She is alert and oriented to person, place, and time.  Skin: Skin is warm and dry.  Psychiatric: She has a normal mood and affect.          Assessment &  Plan:  1. Sore throat  - POCT rapid strep A  2. Cough  - POCT Influenza A/B  3. Exposure to the flu  - POCT Influenza A/B  4. Influenza A  - oseltamivir (TAMIFLU) 75 MG capsule; Take 1 capsule (75 mg total) by mouth 2 (two) times daily.  Dispense: 10 capsule; Refill: 0 -increase fluids  5. Skin infection  - cephALEXin (KEFLEX) 500 MG capsule; Take 1 capsule (500 mg total) by mouth 2 (two) times daily.  Dispense: 20 capsule; Refill: 0 -keep area clean and dry -RTO if symptoms worsen or unresolved Patient verbalized understanding Coralie Keens, FNP-C

## 2013-09-17 NOTE — Patient Instructions (Signed)

## 2013-09-18 NOTE — Telephone Encounter (Signed)
Patient diagnosed with the flu and wants to know if she may still resume with sexual activities or should she wait until symptoms are improved

## 2013-09-19 ENCOUNTER — Other Ambulatory Visit: Payer: Self-pay | Admitting: General Practice

## 2013-09-30 ENCOUNTER — Telehealth: Payer: Self-pay | Admitting: General Practice

## 2013-09-30 NOTE — Telephone Encounter (Signed)
appt tomorrow with [facility]

## 2013-10-01 ENCOUNTER — Ambulatory Visit (INDEPENDENT_AMBULATORY_CARE_PROVIDER_SITE_OTHER): Payer: Medicare Other | Admitting: General Practice

## 2013-10-01 VITALS — BP 120/70 | HR 88 | Temp 97.3°F | Ht 64.0 in | Wt 190.0 lb

## 2013-10-01 DIAGNOSIS — J329 Chronic sinusitis, unspecified: Secondary | ICD-10-CM

## 2013-10-01 DIAGNOSIS — J029 Acute pharyngitis, unspecified: Secondary | ICD-10-CM | POA: Diagnosis not present

## 2013-10-01 MED ORDER — AMOXICILLIN-POT CLAVULANATE 875-125 MG PO TABS: 1.0000 | ORAL_TABLET | Freq: Two times a day (BID) | ORAL | Status: DC

## 2013-10-01 NOTE — Progress Notes (Signed)
   Subjective:    Patient ID: Ariel [name], female    DOB: [birth year], 67 y.o.   MRN: [id number]  Sore Throat  This is a new problem. The current episode started in the past 7 days. The problem has been gradually worsening. Neither side of throat is experiencing more pain than the other. There has been no fever. Associated symptoms include congestion. Pertinent negatives include no coughing, diarrhea, headaches, shortness of breath or vomiting. She has tried cool liquids for the symptoms. The treatment provided no relief.      Review of Systems  HENT: Positive for congestion, sinus pressure and sore throat.   Respiratory: Negative for cough, chest tightness and shortness of breath.   Cardiovascular: Negative for chest pain and palpitations.  Gastrointestinal: Negative for nausea, vomiting and diarrhea.  Genitourinary: Negative for difficulty urinating.  Neurological: Negative for dizziness, weakness and headaches.  All other systems reviewed and are negative.       Objective:   Physical Exam  Constitutional: She is oriented to person, place, and time. She appears well-developed and well-nourished.  HENT:  Head: Normocephalic and atraumatic.  Right Ear: External ear normal.  Left Ear: External ear normal.  Nose: Right sinus exhibits maxillary sinus tenderness and frontal sinus tenderness. Left sinus exhibits maxillary sinus tenderness and frontal sinus tenderness.  Mouth/Throat: Posterior oropharyngeal erythema present.  Cardiovascular: Normal rate, regular rhythm and normal heart sounds.   Pulmonary/Chest: Effort normal and breath sounds normal. No respiratory distress. She exhibits no tenderness.  Neurological: She is alert and oriented to person, place, and time.  Skin: Skin is warm and dry.          Assessment & Plan:  1. Acute pharyngitis  - POCT rapid strep A  2. Sinusitis  - amoxicillin-clavulanate (AUGMENTIN) 875-125 MG per tablet; Take 1 tablet by mouth 2 (two)  times daily.  Dispense: 20 tablet; Refill: 0 -increase fluids -RTO if symptoms worsen or unresolved Patient verbalized understanding Coralie Keens, FNP-C

## 2013-10-01 NOTE — Patient Instructions (Signed)

## 2013-10-02 ENCOUNTER — Ambulatory Visit (INDEPENDENT_AMBULATORY_CARE_PROVIDER_SITE_OTHER): Payer: Medicare Other | Admitting: Family Medicine

## 2013-10-02 ENCOUNTER — Encounter: Payer: Self-pay | Admitting: Family Medicine

## 2013-10-02 VITALS — BP 102/64 | HR 87 | Temp 97.4°F | Ht 64.0 in | Wt 191.4 lb

## 2013-10-02 DIAGNOSIS — H109 Unspecified conjunctivitis: Secondary | ICD-10-CM

## 2013-10-02 MED ORDER — POLYMYXIN B-TRIMETHOPRIM 10000-0.1 UNIT/ML-% OP SOLN: 2.0000 [drp] | OPHTHALMIC | Status: DC

## 2013-10-02 NOTE — Patient Instructions (Signed)

## 2013-10-02 NOTE — Progress Notes (Signed)
   Subjective:    Patient ID: Ariel [name], female    DOB: [birth year], 67 y.o.   MRN: [id number]  HPI This 67 y.o. female presents for evaluation of eye irritation, red eye, and discomfort in her eyes. She was tx for uri recently.   Review of Systems No chest pain, SOB, HA, dizziness, vision change, N/V, diarrhea, constipation, dysuria, urinary urgency or frequency, myalgias, arthralgias or rash.     Objective:   Physical Exam Vital signs noted  Well developed well nourished female.  HEENT - Head atraumatic Normocephalic                Eyes - PERRLA, Conjuctiva - Injected OU Sclera- Clear EOMI                Ears - EAC's Wnl TM's Wnl Gross Hearing WNL                Nose - Nares patent                 Throat - oropharanx wnl Respiratory - Lungs CTA bilateral Cardiac - RRR S1 and S2 without murmur        Assessment & Plan:  Conjunctivitis - Plan: trimethoprim-polymyxin b (POLYTRIM) ophthalmic solution 2 gtt's OU q 2 hours today and then tomorrow every 4 hours.  Follow up prn  Push po fluids, rest, tylenol and motrin otc prn as directed for fever, arthralgias, and myalgias.  Follow up prn if sx's continue or persist.  Deatra Canter FNP

## 2013-10-25 ENCOUNTER — Ambulatory Visit: Payer: Medicare Other | Admitting: General Practice

## 2013-12-16 ENCOUNTER — Encounter: Payer: Self-pay | Admitting: Nurse Practitioner

## 2013-12-16 ENCOUNTER — Telehealth: Payer: Self-pay | Admitting: *Deleted

## 2013-12-16 ENCOUNTER — Ambulatory Visit (INDEPENDENT_AMBULATORY_CARE_PROVIDER_SITE_OTHER): Payer: Medicare Other | Admitting: Nurse Practitioner

## 2013-12-16 VITALS — BP 123/77 | HR 84 | Temp 98.2°F | Ht 64.0 in | Wt 193.2 lb

## 2013-12-16 DIAGNOSIS — J209 Acute bronchitis, unspecified: Secondary | ICD-10-CM | POA: Diagnosis not present

## 2013-12-16 MED ORDER — BENZONATATE 100 MG PO CAPS: 100.0000 mg | ORAL_CAPSULE | Freq: Two times a day (BID) | ORAL | Status: DC | PRN

## 2013-12-16 MED ORDER — AZITHROMYCIN 250 MG PO TABS: ORAL_TABLET | ORAL | Status: DC

## 2013-12-16 NOTE — Progress Notes (Signed)
Subjective:    Patient ID: Ariel [name], female    DOB: 04/19/1946, 68 y.o.   MRN: [id number]  HPI  Patient in c/o cough and congestion- left ear started hurting this morning- All of this started late Saturday evening- Only thing she has been taking is aleve for fever.    Review of Systems  Constitutional: Positive for fever and chills.  HENT: Positive for congestion, ear pain, rhinorrhea and sinus pressure. Negative for sore throat and trouble swallowing.   Respiratory: Positive for cough (productive).   Cardiovascular: Negative.   Genitourinary: Negative.   Neurological: Negative.   Psychiatric/Behavioral: Negative.   All other systems reviewed and are negative.       Objective:   Physical Exam  Constitutional: She is oriented to person, place, and time. She appears well-developed and well-nourished.  HENT:  Right Ear: Hearing, tympanic membrane, external ear and ear canal normal.  Left Ear: Hearing, tympanic membrane, external ear and ear canal normal.  Nose: Mucosal edema and rhinorrhea present. Right sinus exhibits no maxillary sinus tenderness and no frontal sinus tenderness. Left sinus exhibits no maxillary sinus tenderness and no frontal sinus tenderness.  Mouth/Throat: Uvula is midline, oropharynx is clear and moist and mucous membranes are normal.  Eyes: Pupils are equal, round, and reactive to light.  Neck: Normal range of motion. Neck supple.  Cardiovascular: Normal rate, regular rhythm and normal heart sounds.   Pulmonary/Chest: Effort normal and breath sounds normal. No respiratory distress. She has no wheezes. She has no rales.  Deep dry cough with scattered rhonchi  Abdominal: Soft. Bowel sounds are normal.  Lymphadenopathy:    She has no cervical adenopathy.  Neurological: She is alert and oriented to person, place, and time.  Skin: Skin is warm and dry.  Psychiatric: She has a normal mood and affect. Her behavior is normal. Judgment and thought content  normal.    BP 123/77  Pulse 84  Temp(Src) 98.2 F (36.8 C) (Oral)  Ht 5\' 4"  (1.626 m)  Wt 193 lb 3.2 oz (87.635 kg)  BMI 33.15 kg/m2  SpO2 96%       Assessment & Plan:   1. Acute bronchitis    Meds ordered this encounter  Medications  . azithromycin (ZITHROMAX Z-PAK) 250 MG tablet    Sig: As directed    Dispense:  6 each    Refill:  0    Order Specific Question:  Supervising Provider    Answer:  Ernestina PennaMOORE, DONALD W [[id number]]  . benzonatate (TESSALON) 100 MG capsule    Sig: Take 1 capsule (100 mg total) by mouth 2 (two) times daily as needed for cough.    Dispense:  20 capsule    Refill:  0    Order Specific Question:  Supervising Provider    Answer:  Ernestina PennaMOORE, DONALD W [[id number]]   1. Take meds as prescribed 2. Use a cool mist humidifier especially during the winter months and when heat has been humid. 3. Use saline nose sprays frequently 4. Saline irrigations of the nose can be very helpful if done frequently.  * 4X daily for 1 week*  * Use of a nettie pot can be helpful with this. Follow directions with this* 5. Drink plenty of fluids 6. Keep thermostat turn down low 7.For any cough or congestion  Use plain Mucinex- regular strength or max strength is fine   * Children- consult with Pharmacist for dosing 8. For fever or aces or pains- take tylenol or ibuprofen appropriate  for age and weight.  * for fevers greater than 101 orally you may alternate ibuprofen and tylenol every  3 hours.   Mary-Margaret Hassell Done, FNP

## 2013-12-16 NOTE — Patient Instructions (Signed)

## 2013-12-16 NOTE — Telephone Encounter (Signed)
Ariel [name], I talked with Ariel [name] and she said she had never taken anything except crestor for her hyperlipidemia, she will have to have tried and failed at least one or maybe two preferred cholesterol meds, they are atorvastatin, lovastatin, pravastatin or simvastatin before they will cover crestor.  I explained this to her and told her that you would probably be calling her in something else and if she had any problems to please let us know and she seemed to understand and be ok with this.  Thanks Ariel [name].

## 2013-12-18 ENCOUNTER — Telehealth: Payer: Self-pay | Admitting: General Practice

## 2013-12-19 ENCOUNTER — Telehealth: Payer: Self-pay | Admitting: *Deleted

## 2013-12-19 ENCOUNTER — Other Ambulatory Visit: Payer: Self-pay | Admitting: *Deleted

## 2013-12-19 ENCOUNTER — Other Ambulatory Visit: Payer: Self-pay | Admitting: General Practice

## 2013-12-19 ENCOUNTER — Telehealth: Payer: Self-pay | Admitting: Family Medicine

## 2013-12-19 DIAGNOSIS — E785 Hyperlipidemia, unspecified: Secondary | ICD-10-CM

## 2013-12-19 MED ORDER — SIMVASTATIN 20 MG PO TABS: 20.0000 mg | ORAL_TABLET | Freq: Every day | ORAL | Status: DC

## 2013-12-19 MED ORDER — ROSUVASTATIN CALCIUM 10 MG PO TABS: 10.0000 mg | ORAL_TABLET | Freq: Every day | ORAL | Status: DC

## 2013-12-21 ENCOUNTER — Other Ambulatory Visit: Payer: Self-pay | Admitting: General Practice

## 2013-12-21 NOTE — Telephone Encounter (Signed)
Already addressed

## 2013-12-26 ENCOUNTER — Encounter: Payer: Self-pay | Admitting: Family Medicine

## 2013-12-26 ENCOUNTER — Ambulatory Visit (INDEPENDENT_AMBULATORY_CARE_PROVIDER_SITE_OTHER): Payer: Medicare Other | Admitting: Family Medicine

## 2013-12-26 ENCOUNTER — Ambulatory Visit (INDEPENDENT_AMBULATORY_CARE_PROVIDER_SITE_OTHER): Payer: Medicare Other

## 2013-12-26 VITALS — BP 126/80 | HR 88 | Temp 98.4°F | Ht 64.0 in | Wt 194.8 lb

## 2013-12-26 DIAGNOSIS — J209 Acute bronchitis, unspecified: Secondary | ICD-10-CM

## 2013-12-26 DIAGNOSIS — J329 Chronic sinusitis, unspecified: Secondary | ICD-10-CM | POA: Diagnosis not present

## 2013-12-26 DIAGNOSIS — H60399 Other infective otitis externa, unspecified ear: Secondary | ICD-10-CM | POA: Diagnosis not present

## 2013-12-26 DIAGNOSIS — R059 Cough, unspecified: Secondary | ICD-10-CM

## 2013-12-26 DIAGNOSIS — R05 Cough: Secondary | ICD-10-CM

## 2013-12-26 MED ORDER — LEVALBUTEROL HCL 1.25 MG/0.5ML IN NEBU: 1.2500 mg | INHALATION_SOLUTION | Freq: Once | RESPIRATORY_TRACT | Status: DC

## 2013-12-26 MED ORDER — LEVALBUTEROL HCL 1.25 MG/3ML IN NEBU
1.2500 mg | INHALATION_SOLUTION | Freq: Once | RESPIRATORY_TRACT | Status: AC
  Administered 2013-12-26: 1.25 mg via RESPIRATORY_TRACT

## 2013-12-26 MED ORDER — METHYLPREDNISOLONE (PAK) 4 MG PO TABS: ORAL_TABLET | ORAL | Status: DC

## 2013-12-26 MED ORDER — LEVOFLOXACIN 500 MG PO TABS: 500.0000 mg | ORAL_TABLET | Freq: Every day | ORAL | Status: DC

## 2013-12-26 MED ORDER — HYDROCODONE-HOMATROPINE 5-1.5 MG/5ML PO SYRP: 5.0000 mL | ORAL_SOLUTION | Freq: Three times a day (TID) | ORAL | Status: DC | PRN

## 2013-12-26 MED ORDER — NEOMYCIN-POLYMYXIN-HC 1 % OT SOLN: 3.0000 [drp] | Freq: Four times a day (QID) | OTIC | Status: DC

## 2013-12-26 NOTE — Progress Notes (Signed)
   Subjective:    Patient ID: Ariel [name], female    DOB: 02/18/1946, 68 y.o.   MRN: [id number]  HPI This 68 y.o. female presents for evaluation of cough and uri sx's. Patient is c/o left otalgia. She has been having some wheezing and coughing a lot at night. She is having productive Cough.   Review of Systems C/o cough No chest pain, SOB, HA, dizziness, vision change, N/V, diarrhea, constipation, dysuria, urinary urgency or frequency, myalgias, arthralgias or rash.     Objective:   Physical Exam  Vital signs noted  Well developed well nourished female.  HEENT - Head atraumatic Normocephalic                Eyes - PERRLA, Conjuctiva - clear Sclera- Clear EOMI                Ears - EAC's Wnl TM's Wnl Gross Hearing WNL                Throat - oropharanx wnl Respiratory - Lungs with expiratory wheezes left chest Cardiac - RRR S1 and S2 without murmur GI - Abdomen soft Nontender and bowel sounds active x 4.  Lungs CTA bilateral after neb tx.  CXR - No infiltrates Prelimnary reading by Angeline SlimWilliam Oxford,FNP    Assessment & Plan:  Sinusitis - Plan: methylPREDNIsolone (MEDROL DOSPACK) 4 MG tablet, HYDROcodone-homatropine (HYCODAN) 5-1.5 MG/5ML syrup, levalbuterol (XOPENEX) nebulizer solution 1.25 mg, DG Chest 2 View, levofloxacin (LEVAQUIN) 500 MG tablet  Cough - Plan: methylPREDNIsolone (MEDROL DOSPACK) 4 MG tablet, HYDROcodone-homatropine (HYCODAN) 5-1.5 MG/5ML syrup, levalbuterol (XOPENEX) nebulizer solution 1.25 mg, DG Chest 2 View, levofloxacin (LEVAQUIN) 500 MG tablet  Acute bronchitis - Plan: methylPREDNIsolone (MEDROL DOSPACK) 4 MG tablet, HYDROcodone-homatropine (HYCODAN) 5-1.5 MG/5ML syrup, levalbuterol (XOPENEX) nebulizer solution 1.25 mg, DG Chest 2 View, levofloxacin (LEVAQUIN) 500 MG tablet  Otitis, externa, infective - Plan: NEOMYCIN-POLYMYXIN-HYDROCORTISONE (CORTISPORIN) 1 % SOLN otic solution  Push po fluids, rest, tylenol and motrin otc prn as directed for fever,  arthralgias, and myalgias.  Follow up prn if sx's continue or persist.  Deatra CanterWilliam J Oxford FNP

## 2014-01-22 NOTE — Telephone Encounter (Signed)
Talked with pt regarding new med

## 2014-03-18 DIAGNOSIS — Z1231 Encounter for screening mammogram for malignant neoplasm of breast: Secondary | ICD-10-CM | POA: Diagnosis not present

## 2014-04-29 ENCOUNTER — Ambulatory Visit (INDEPENDENT_AMBULATORY_CARE_PROVIDER_SITE_OTHER): Payer: Medicare Other | Admitting: Nurse Practitioner

## 2014-04-29 ENCOUNTER — Encounter: Payer: Self-pay | Admitting: Nurse Practitioner

## 2014-04-29 VITALS — BP 128/81 | HR 85 | Temp 97.0°F | Ht 64.0 in | Wt 199.4 lb

## 2014-04-29 DIAGNOSIS — Z713 Dietary counseling and surveillance: Secondary | ICD-10-CM | POA: Diagnosis not present

## 2014-04-29 DIAGNOSIS — Z01419 Encounter for gynecological examination (general) (routine) without abnormal findings: Secondary | ICD-10-CM | POA: Diagnosis not present

## 2014-04-29 DIAGNOSIS — E669 Obesity, unspecified: Secondary | ICD-10-CM

## 2014-04-29 DIAGNOSIS — Z0000 Encounter for general adult medical examination without abnormal findings: Secondary | ICD-10-CM

## 2014-04-29 DIAGNOSIS — Z122 Encounter for screening for malignant neoplasm of respiratory organs: Secondary | ICD-10-CM | POA: Diagnosis not present

## 2014-04-29 DIAGNOSIS — I10 Essential (primary) hypertension: Secondary | ICD-10-CM

## 2014-04-29 DIAGNOSIS — E785 Hyperlipidemia, unspecified: Secondary | ICD-10-CM

## 2014-04-29 DIAGNOSIS — R635 Abnormal weight gain: Secondary | ICD-10-CM

## 2014-04-29 DIAGNOSIS — Z23 Encounter for immunization: Secondary | ICD-10-CM

## 2014-04-29 DIAGNOSIS — R87615 Unsatisfactory cytologic smear of cervix: Secondary | ICD-10-CM | POA: Diagnosis not present

## 2014-04-29 LAB — POCT URINALYSIS DIPSTICK
Bilirubin, UA: NEGATIVE
Glucose, UA: NEGATIVE
KETONES UA: NEGATIVE
Leukocytes, UA: NEGATIVE
Nitrite, UA: NEGATIVE
Protein, UA: NEGATIVE
Spec Grav, UA: 1.015
Urobilinogen, UA: NEGATIVE
pH, UA: 5

## 2014-04-29 LAB — POCT UA - MICROSCOPIC ONLY
Bacteria, U Microscopic: NEGATIVE
Casts, Ur, LPF, POC: NEGATIVE
Crystals, Ur, HPF, POC: NEGATIVE
Mucus, UA: NEGATIVE
WBC, Ur, HPF, POC: NEGATIVE
Yeast, UA: NEGATIVE

## 2014-04-29 LAB — POCT CBC
GRANULOCYTE PERCENT: 50.1 % (ref 37–80)
HCT, POC: 38.5 % (ref 37.7–47.9)
HEMOGLOBIN: 12.7 g/dL (ref 12.2–16.2)
LYMPH, POC: 2.3 (ref 0.6–3.4)
MCH, POC: 27.6 pg (ref 27–31.2)
MCHC: 32.9 g/dL (ref 31.8–35.4)
MCV: 83.8 fL (ref 80–97)
MPV: 7.2 fL (ref 0–99.8)
PLATELET COUNT, POC: 277 10*3/uL (ref 142–424)
POC GRANULOCYTE: 2.7 (ref 2–6.9)
POC LYMPH PERCENT: 42.1 %L (ref 10–50)
RBC: 4.6 M/uL (ref 4.04–5.48)
RDW, POC: 13.6 %
WBC: 5.4 10*3/uL (ref 4.6–10.2)

## 2014-04-29 MED ORDER — SIMVASTATIN 20 MG PO TABS: 20.0000 mg | ORAL_TABLET | Freq: Every day | ORAL | Status: DC

## 2014-04-29 NOTE — Progress Notes (Signed)
Subjective:    Patient ID: Ariel [name], female    DOB: [birth year], 68 y.o.   MRN: [id number]  Patient is here today for annual physical with pap.   Hypertension This is a chronic problem. The current episode started more than 1 year ago. The problem is controlled. Risk factors for coronary artery disease include obesity, post-menopausal state and dyslipidemia. Past treatments include diuretics and calcium channel blockers. The current treatment provides moderate improvement. There are no compliance problems.   Hyperlipidemia This is a chronic problem. The current episode started more than 1 year ago. Current antihyperlipidemic treatment includes statins. There are no compliance problems.  Risk factors for coronary artery disease include hypertension, post-menopausal and dyslipidemia.   *complains of left shoulder pain. She is seeing Dr. Charm Rings  in [place], her next appointment is in September. She is currently taking ibuprofen 862m po PRN.    Review of Systems  Constitutional: Negative.   HENT: Negative.   Eyes: Negative.   Respiratory: Negative.   Cardiovascular: Negative.   Gastrointestinal: Negative.   Endocrine: Negative.   Genitourinary: Negative.   Musculoskeletal:       Left shoulder pain.  Hx of carpal tunnel syndrome bil hands.   Skin: Negative.   Allergic/Immunologic: Negative.   Neurological: Negative.   Hematological: Negative.   Psychiatric/Behavioral: Negative.        Objective:   Physical Exam  Constitutional: She is oriented to person, place, and time. She appears well-developed and well-nourished.  HENT:  Head: Normocephalic.  Right Ear: Hearing, tympanic membrane, external ear and ear canal normal.  Left Ear: Hearing, tympanic membrane, external ear and ear canal normal.  Nose: Nose normal.  Mouth/Throat: Uvula is midline and oropharynx is clear and moist.  Eyes: Conjunctivae and EOM are normal. Pupils are equal, round, and reactive to light.  Neck:  Normal range of motion and full passive range of motion without pain. Neck supple. No JVD present. Carotid bruit is not present. No mass and no thyromegaly present.  Cardiovascular: Normal rate, normal heart sounds and intact distal pulses.   No murmur heard. Pulmonary/Chest: Effort normal and breath sounds normal. Right breast exhibits no inverted nipple, no mass, no nipple discharge, no skin change and no tenderness. Left breast exhibits no inverted nipple, no mass, no nipple discharge, no skin change and no tenderness.  Abdominal: Soft. Bowel sounds are normal. She exhibits no mass. There is no tenderness.  Genitourinary: Vagina normal and uterus normal. No breast swelling, tenderness, discharge or bleeding.  bimanual exam-No adnexal masses or tenderness. Vaginal cuff intact   Musculoskeletal: Normal range of motion.  Lymphadenopathy:    She has no cervical adenopathy.  Neurological: She is alert and oriented to person, place, and time.  Skin: Skin is warm and dry.  Psychiatric: She has a normal mood and affect. Her behavior is normal. Judgment and thought content normal.   BP 128/81  Pulse 85  Temp(Src) 97 F (36.1 C) (Oral)  Ht 5' 4"  (1.626 m)  Wt 199 lb 6.4 oz (90.447 kg)  BMI 34.21 kg/m2  Results for orders placed in visit on 04/29/14  POCT UA - MICROSCOPIC ONLY      Result Value Ref Range   WBC, Ur, HPF, POC neg     RBC, urine, microscopic 5-10     Bacteria, U Microscopic neg     Mucus, UA neg     Epithelial cells, urine per micros occ     Crystals, Ur, HPF, POC neg  Casts, Ur, LPF, POC neg     Yeast, UA neg    POCT URINALYSIS DIPSTICK      Result Value Ref Range   Color, UA yellow     Clarity, UA clear     Glucose, UA neg     Bilirubin, UA neg     Ketones, UA neg     Spec Grav, UA 1.015     Blood, UA mod     pH, UA 5.0     Protein, UA neg     Urobilinogen, UA negative     Nitrite, UA neg     Leukocytes, UA Negative           Assessment & Plan:   1.  Routine general medical examination at a health care facility   2. Annual physical exam   3. HLD (hyperlipidemia)   4. Essential hypertension   5. Obesity, unspecified   6. Hyperlipidemia    Orders Placed This Encounter  Procedures  . CMP14+EGFR  . NMR, lipoprofile  . POCT UA - Microscopic Only  . POCT urinalysis dipstick   Meds ordered this encounter  Medications  . ibuprofen (ADVIL,MOTRIN) 800 MG tablet    Sig: Take 800 mg by mouth daily as needed.  . simvastatin (ZOCOR) 20 MG tablet    Sig: Take 1 tablet (20 mg total) by mouth daily.    Dispense:  30 tablet    Refill:  5    Order Specific Question:  Supervising Provider    Answer:  Chipper Herb [[id number]]   zostavax today- will get pneumonia vaccine at next visit Labs pending Health maintenance reviewed Diet and exercise encouraged Continue all meds Follow up  In 3 month  [place], FNP

## 2014-04-29 NOTE — Patient Instructions (Addendum)
Exercise to Lose Weight Exercise and a healthy diet may help you lose weight. Your doctor may suggest specific exercises. EXERCISE IDEAS AND TIPS  Choose low-cost things you enjoy doing, such as walking, bicycling, or exercising to workout videos.  Take stairs instead of the elevator.  Walk during your lunch break.  Park your car further away from work or school.  Go to a gym or an exercise class.  Start with 5 to 10 minutes of exercise each day. Build up to 30 minutes of exercise 4 to 6 days a week.  Wear shoes with good support and comfortable clothes.  Stretch before and after working out.  Work out until you breathe harder and your heart beats faster.  Drink extra water when you exercise.  Do not do so much that you hurt yourself, feel dizzy, or get very short of breath. Exercises that burn about 150 calories:  Running 1  miles in 15 minutes.  Playing volleyball for 45 to 60 minutes.  Washing and waxing a car for 45 to 60 minutes.  Playing touch football for 45 minutes.  Walking 1  miles in 35 minutes.  Pushing a stroller 1  miles in 30 minutes.  Playing basketball for 30 minutes.  Raking leaves for 30 minutes.  Bicycling 5 miles in 30 minutes.  Walking 2 miles in 30 minutes.  Dancing for 30 minutes.  Shoveling snow for 15 minutes.  Swimming laps for 20 minutes.  Walking up stairs for 15 minutes.  Bicycling 4 miles in 15 minutes.  Gardening for 30 to 45 minutes.  Jumping rope for 15 minutes.  Washing windows or floors for 45 to 60 minutes. Document Released: 10/22/2010 Document Revised: 12/12/2011 Document Reviewed: 10/22/2010 Exit[employer] Patient Information 2015 [employer]. This information is not intended to replace advice given to you by your health care provider. Make sure you discuss any questions you have with your health care provider.  

## 2014-04-30 LAB — CMP14+EGFR
A/G RATIO: 1.4 (ref 1.1–2.5)
ALT: 16 IU/L (ref 0–32)
AST: 17 IU/L (ref 0–40)
Albumin: 4.3 g/dL (ref 3.6–4.8)
Alkaline Phosphatase: 88 IU/L (ref 39–117)
BUN/Creatinine Ratio: 20 (ref 11–26)
BUN: 14 mg/dL (ref 8–27)
CALCIUM: 10.2 mg/dL (ref 8.7–10.3)
CO2: 25 mmol/L (ref 18–29)
CREATININE: 0.69 mg/dL (ref 0.57–1.00)
Chloride: 99 mmol/L (ref 97–108)
GFR calc Af Amer: 104 mL/min/{1.73_m2} (ref >59)
GFR calc non Af Amer: 90 mL/min/{1.73_m2} (ref >59)
GLUCOSE: 102 mg/dL — AB (ref 65–99)
Globulin, Total: 3 g/dL (ref 1.5–4.5)
POTASSIUM: 4.4 mmol/L (ref 3.5–5.2)
Sodium: 139 mmol/L (ref 134–144)
TOTAL PROTEIN: 7.3 g/dL (ref 6.0–8.5)
Total Bilirubin: 0.2 mg/dL (ref 0.0–1.2)

## 2014-04-30 LAB — THYROID PANEL WITH TSH
FREE THYROXINE INDEX: 1.7 (ref 1.2–4.9)
T3 UPTAKE RATIO: 26 % (ref 24–39)
T4 TOTAL: 6.7 ug/dL (ref 4.5–12.0)
TSH: 1.37 u[IU]/mL (ref 0.450–4.500)

## 2014-04-30 LAB — NMR, LIPOPROFILE
Cholesterol: 229 mg/dL — ABNORMAL HIGH (ref 100–199)
HDL CHOLESTEROL BY NMR: 48 mg/dL (ref >39)
HDL Particle Number: 34.8 umol/L (ref >=30.5)
LDL Particle Number: 1776 nmol/L — ABNORMAL HIGH (ref <1000)
LDL Size: 20.4 nm (ref >20.5)
LDLC SERPL CALC-MCNC: 141 mg/dL — AB (ref 0–99)
LP-IR Score: 90 — ABNORMAL HIGH (ref <=45)
Small LDL Particle Number: 945 nmol/L — ABNORMAL HIGH (ref <=527)
Triglycerides by NMR: 199 mg/dL — ABNORMAL HIGH (ref 0–149)

## 2014-05-01 ENCOUNTER — Telehealth: Payer: Self-pay | Admitting: Nurse Practitioner

## 2014-05-01 NOTE — Telephone Encounter (Signed)
Area arround injection site is red and swollen. Explained that this sounds like a local reaction and is the most common reaction to this vaccine. Advised her to apply ice for 20 minutes at a time as often as necessary to reduce pain, swelling, and itching. She can take Benadryl and ibuprofen (if tolerated) as well. She should report any worsening of symptoms or if it does not resolve within a couple of days. Patient stated understanding and agreement to plan.

## 2014-05-02 ENCOUNTER — Other Ambulatory Visit: Payer: Self-pay | Admitting: Nurse Practitioner

## 2014-05-02 LAB — PAP IG W/ RFLX HPV ASCU: PAP SMEAR COMMENT: 0

## 2014-05-02 MED ORDER — SIMVASTATIN 40 MG PO TABS: 40.0000 mg | ORAL_TABLET | Freq: Every day | ORAL | Status: DC

## 2014-05-05 ENCOUNTER — Telehealth: Payer: Self-pay | Admitting: Nurse Practitioner

## 2014-05-05 NOTE — Telephone Encounter (Signed)
Labs were discussed with patient.

## 2014-05-19 ENCOUNTER — Telehealth: Payer: Self-pay | Admitting: Nurse Practitioner

## 2014-05-19 MED ORDER — SIMVASTATIN 40 MG PO TABS: 40.0000 mg | ORAL_TABLET | Freq: Every day | ORAL | Status: DC

## 2014-05-19 NOTE — Telephone Encounter (Signed)
Patient aware rx sent to pharmacy.  

## 2014-06-21 ENCOUNTER — Emergency Department (HOSPITAL_COMMUNITY): Payer: Medicare Other

## 2014-06-21 ENCOUNTER — Encounter (HOSPITAL_COMMUNITY): Payer: Self-pay | Admitting: Emergency Medicine

## 2014-06-21 ENCOUNTER — Emergency Department (HOSPITAL_COMMUNITY)
Admission: EM | Admit: 2014-06-21 | Discharge: 2014-06-22 | Disposition: A | Discharge status: Home / Self Care | Payer: Medicare Other | Attending: Emergency Medicine | Admitting: Emergency Medicine

## 2014-06-21 DIAGNOSIS — R110 Nausea: Secondary | ICD-10-CM | POA: Diagnosis not present

## 2014-06-21 DIAGNOSIS — I10 Essential (primary) hypertension: Secondary | ICD-10-CM | POA: Insufficient documentation

## 2014-06-21 DIAGNOSIS — Z8719 Personal history of other diseases of the digestive system: Secondary | ICD-10-CM | POA: Diagnosis not present

## 2014-06-21 DIAGNOSIS — Z8739 Personal history of other diseases of the musculoskeletal system and connective tissue: Secondary | ICD-10-CM | POA: Insufficient documentation

## 2014-06-21 DIAGNOSIS — R0602 Shortness of breath: Secondary | ICD-10-CM | POA: Diagnosis not present

## 2014-06-21 DIAGNOSIS — E785 Hyperlipidemia, unspecified: Secondary | ICD-10-CM | POA: Diagnosis not present

## 2014-06-21 DIAGNOSIS — Z79899 Other long term (current) drug therapy: Secondary | ICD-10-CM | POA: Insufficient documentation

## 2014-06-21 DIAGNOSIS — J40 Bronchitis, not specified as acute or chronic: Secondary | ICD-10-CM

## 2014-06-21 DIAGNOSIS — R079 Chest pain, unspecified: Secondary | ICD-10-CM | POA: Insufficient documentation

## 2014-06-21 DIAGNOSIS — J209 Acute bronchitis, unspecified: Secondary | ICD-10-CM | POA: Diagnosis not present

## 2014-06-21 DIAGNOSIS — R918 Other nonspecific abnormal finding of lung field: Secondary | ICD-10-CM | POA: Diagnosis not present

## 2014-06-21 LAB — I-STAT CHEM 8, ED
BUN: 15 mg/dL (ref 6–23)
Calcium, Ion: 1.2 mmol/L (ref 1.13–1.30)
Chloride: 105 mEq/L (ref 96–112)
Creatinine, Ser: 0.6 mg/dL (ref 0.50–1.10)
Glucose, Bld: 89 mg/dL (ref 70–99)
HCT: 38 % (ref 36.0–46.0)
Hemoglobin: 12.9 g/dL (ref 12.0–15.0)
POTASSIUM: 3.7 meq/L (ref 3.7–5.3)
SODIUM: 142 meq/L (ref 137–147)
TCO2: 24 mmol/L (ref 0–100)

## 2014-06-21 LAB — CBC WITH DIFFERENTIAL/PLATELET
BASOS ABS: 0 10*3/uL (ref 0.0–0.1)
Basophils Relative: 1 % (ref 0–1)
Eosinophils Absolute: 0.2 10*3/uL (ref 0.0–0.7)
Eosinophils Relative: 4 % (ref 0–5)
HCT: 36.1 % (ref 36.0–46.0)
Hemoglobin: 12.2 g/dL (ref 12.0–15.0)
Lymphocytes Relative: 37 % (ref 12–46)
Lymphs Abs: 2.1 10*3/uL (ref 0.7–4.0)
MCH: 28 pg (ref 26.0–34.0)
MCHC: 33.8 g/dL (ref 30.0–36.0)
MCV: 83 fL (ref 78.0–100.0)
Monocytes Absolute: 0.8 10*3/uL (ref 0.1–1.0)
Monocytes Relative: 14 % — ABNORMAL HIGH (ref 3–12)
Neutro Abs: 2.4 10*3/uL (ref 1.7–7.7)
Neutrophils Relative %: 44 % (ref 43–77)
Platelets: 272 10*3/uL (ref 150–400)
RBC: 4.35 MIL/uL (ref 3.87–5.11)
RDW: 13.8 % (ref 11.5–15.5)
WBC: 5.5 10*3/uL (ref 4.0–10.5)

## 2014-06-21 LAB — I-STAT TROPONIN, ED: Troponin i, poc: 0 ng/mL (ref 0.00–0.08)

## 2014-06-21 LAB — D-DIMER, QUANTITATIVE (NOT AT ARMC): D DIMER QUANT: 0.69 ug{FEU}/mL — AB (ref 0.00–0.48)

## 2014-06-21 MED ORDER — IOHEXOL 350 MG/ML SOLN
100.0000 mL | Freq: Once | INTRAVENOUS | Status: AC | PRN
  Administered 2014-06-21: 100 mL via INTRAVENOUS

## 2014-06-21 NOTE — ED Notes (Signed)
Patient transported to CT 

## 2014-06-21 NOTE — ED Notes (Signed)
Patient transported by [facility] for complaint of chest pain starting at 1130 this morning.  Patient states she has also had a cold with sinus congestion.  Patient was given 324 mg Aspirin by EMS.

## 2014-06-21 NOTE — ED Notes (Signed)
Patient transported to X-ray 

## 2014-06-21 NOTE — ED Provider Notes (Signed)
CSN: [id number]     Arrival date & time 06/21/14  2116 History   First MD Initiated Contact with Patient 06/21/14 2124     Chief Complaint  Patient presents with  . Chest Pain     (Consider location/radiation/quality/duration/timing/severity/associated sxs/prior Treatment) HPI Comments: Patient presents with chest pain. She states that last night she started having some nasal congestion and coughing. Today she started having some sharp pains in the left side of her chest. It's worse with coughing and worse with breathing. She's felt feverish but hasn't checked her temperature. She denies any productive cough. She denies any leg swelling or calf tenderness. She states that the pain is sharp and nonradiating. It's been intermittent throughout the day. It's nonexertional. She denies any history of cardiac disease.   Past Medical History  Diagnosis Date  . Hypertension   . Hyperlipidemia   . Diverticulosis   . Osteopenia    Past Surgical History  Procedure Laterality Date  . Abdominal hysterectomy  1985  . Carpal tunnel release Right 2007   Family History  Problem Relation Age of Onset  . Diabetes Mother   . Cancer Father 62    colon   History  Substance Use Topics  . Smoking status: Never Smoker   . Smokeless tobacco: Not on file  . Alcohol Use: Yes     Comment: social   OB History   Grav Para Term Preterm Abortions TAB SAB Ect Mult Living                 Review of Systems  Constitutional: Negative for fever, chills, diaphoresis and fatigue.  HENT: Positive for congestion and rhinorrhea. Negative for sneezing.   Eyes: Negative.   Respiratory: Positive for cough. Negative for chest tightness and shortness of breath.   Cardiovascular: Positive for chest pain. Negative for leg swelling.  Gastrointestinal: Positive for nausea. Negative for vomiting, abdominal pain, diarrhea and blood in stool.  Genitourinary: Negative for frequency, hematuria, flank pain and difficulty  urinating.  Musculoskeletal: Negative for arthralgias and back pain.  Skin: Negative for rash.  Neurological: Negative for dizziness, speech difficulty, weakness, numbness and headaches.      Allergies  Review of patient's allergies indicates no known allergies.  Home Medications   Prior to Admission medications   Medication Sig Start Date End Date Taking? Authorizing Provider  diphenhydrAMINE (BENADRYL) 25 MG tablet Take 25 mg by mouth every 6 (six) hours as needed for itching or allergies.   Yes Historical Provider, MD  simvastatin (ZOCOR) 40 MG tablet Take 1 tablet (40 mg total) by mouth at bedtime. 05/19/14  Yes Mary-Margaret Daphine Deutscher, FNP  valsartan-hydrochlorothiazide (DIOVAN-HCT) 160-25 MG per tablet Take 1/2 daily 07/25/13  Yes Mae Shelda Altes, FNP  levofloxacin (LEVAQUIN) 750 MG tablet Take 1 tablet (750 mg total) by mouth daily. X 6 more days 06/22/14   Rolan Bucco, MD   BP 128/76  Pulse 82  Temp(Src) 98.3 F (36.8 C) (Oral)  Resp 22  Ht  (1.626 m)  Wt 195 lb (88.451 kg)  BMI 33.46 kg/m2  SpO2 100% Physical Exam  Constitutional: She is oriented to person, place, and time. She appears well-developed and well-nourished.  HENT:  Head: Normocephalic and atraumatic.  Eyes: Pupils are equal, round, and reactive to light.  Neck: Normal range of motion. Neck supple.  Cardiovascular: Normal rate, regular rhythm and normal heart sounds.   Pulmonary/Chest: Effort normal and breath sounds normal. No respiratory distress. She has no wheezes. She  has no rales. She exhibits tenderness (mild tenderness on palpation of left chest wall).  Abdominal: Soft. Bowel sounds are normal. There is no tenderness. There is no rebound and no guarding.  Musculoskeletal: Normal range of motion. She exhibits no edema.  No calf tenderness  Lymphadenopathy:    She has no cervical adenopathy.  Neurological: She is alert and oriented to person, place, and time.  Skin: Skin is warm and dry. No  rash noted.  Psychiatric: She has a normal mood and affect.    ED Course  Procedures (including critical care time) Labs Review Results for orders placed during the hospital encounter of 06/21/14  CBC WITH DIFFERENTIAL      Result Value Ref Range   WBC 5.5  4.0 - 10.5 K/uL   RBC 4.35  3.87 - 5.11 MIL/uL   Hemoglobin 12.2  12.0 - 15.0 g/dL   HCT 16.1  09.6 - 04.5 %   MCV 83.0  78.0 - 100.0 fL   MCH 28.0  26.0 - 34.0 pg   MCHC 33.8  30.0 - 36.0 g/dL   RDW 40.9  81.1 - 91.4 %   Platelets 272  150 - 400 K/uL   Neutrophils Relative % 44  43 - 77 %   Neutro Abs 2.4  1.7 - 7.7 K/uL   Lymphocytes Relative 37  12 - 46 %   Lymphs Abs 2.1  0.7 - 4.0 K/uL   Monocytes Relative 14 (*) 3 - 12 %   Monocytes Absolute 0.8  0.1 - 1.0 K/uL   Eosinophils Relative 4  0 - 5 %   Eosinophils Absolute 0.2  0.0 - 0.7 K/uL   Basophils Relative 1  0 - 1 %   Basophils Absolute 0.0  0.0 - 0.1 K/uL  D-DIMER, QUANTITATIVE      Result Value Ref Range   D-Dimer, Quant 0.69 (*) 0.00 - 0.48 ug/mL-FEU  I-STAT CHEM 8, ED      Result Value Ref Range   Sodium 142  137 - 147 mEq/L   Potassium 3.7  3.7 - 5.3 mEq/L   Chloride 105  96 - 112 mEq/L   BUN 15  6 - 23 mg/dL   Creatinine, Ser 7.82  0.50 - 1.10 mg/dL   Glucose, Bld 89  70 - 99 mg/dL   Calcium, Ion 9.56  2.13 - 1.30 mmol/L   TCO2 24  0 - 100 mmol/L   Hemoglobin 12.9  12.0 - 15.0 g/dL   HCT 08.6  57.8 - 46.9 %  I-STAT TROPOININ, ED      Result Value Ref Range   Troponin i, poc 0.00  0.00 - 0.08 ng/mL   Comment 3            Dg Chest 2 View  06/21/2014   CLINICAL DATA:  Chest pain, shortness of breath.  EXAM: CHEST  2 VIEW  COMPARISON:  Chest radiograph October 16, 2012  FINDINGS: Pain Cardiomediastinal silhouette is unremarkable. The lungs are clear without pleural effusions or focal consolidations. Trachea projects midline and there is no pneumothorax. Soft tissue planes and included osseous structures are non-suspicious.  IMPRESSION: No acute  cardiopulmonary process.   Electronically Signed   By: Awilda Metro   On: 06/21/2014 22:20      Imaging Review Dg Chest 2 View  06/21/2014   CLINICAL DATA:  Chest pain, shortness of breath.  EXAM: CHEST  2 VIEW  COMPARISON:  Chest radiograph October 16, 2012  FINDINGS: Pain  Cardiomediastinal silhouette is unremarkable. The lungs are clear without pleural effusions or focal consolidations. Trachea projects midline and there is no pneumothorax. Soft tissue planes and included osseous structures are non-suspicious.  IMPRESSION: No acute cardiopulmonary process.   Electronically Signed   By: Awilda Metro   On: 06/21/2014 22:20   Ct Angio Chest Pe W/cm &/or Wo Cm  06/22/2014   CLINICAL DATA:  Left-sided chest pain  EXAM: CT ANGIOGRAPHY CHEST WITH CONTRAST  TECHNIQUE: Multidetector CT imaging of the chest was performed using the standard protocol during bolus administration of intravenous contrast. Multiplanar CT image reconstructions and MIPs were obtained to evaluate the vascular anatomy.  CONTRAST:  OMNIPAQUE IOHEXOL 350 MG/ML SOLN  COMPARISON:  None.  FINDINGS: There are no filling defects within the pulmonary arteries to suggest acute pulmonary embolism. No acute findings of the aorta or great vessels. No pericardial fluid. Esophagus.  Review of the lung parenchyma demonstrates mild diffuse ground-glass opacities and interlobular septal thickening. No pleural fluid.  No axillary supraclavicular adenopathy.  No mediastinal.  No free fluid the pelvis.  No aggressive osseous lesion  Review of the MIP images confirms the above findings.  IMPRESSION: 1. No evidence acute pulmonary embolism. 2. Mild ground-glass opacities suggest mild pulmonary edema or atelectasis.   Electronically Signed   By: Genevive Bi M.D.   On: 06/22/2014 00:01     EKG Interpretation   Date/Time:  Saturday June 21 2014 21:24:09 EDT Ventricular Rate:  80 PR Interval:  157 QRS Duration: 141 QT Interval:   410 QTC Calculation: 473 R Axis:   -46 Text Interpretation:  Sinus rhythm Left bundle branch block No old tracing  to compare Confirmed by Ireanna Finlayson  MD, Volney Reierson ([id number]) on 06/21/2014 9:26:28 PM      MDM   Final diagnoses:  Bronchitis    Pt presents with nasal congestion, coughing, left side sharp chest pain.  Pain is reproducible on palpation.  Does not sound consistent with ACS.  No PE seen.  sats 100% on RA.  Will d/c.  Start abx for probable bronchitis.    Rolan Bucco, MD 06/22/14 [phone]

## 2014-06-22 ENCOUNTER — Telehealth (HOSPITAL_COMMUNITY): Payer: Self-pay

## 2014-06-22 DIAGNOSIS — J40 Bronchitis, not specified as acute or chronic: Secondary | ICD-10-CM | POA: Diagnosis not present

## 2014-06-22 MED ORDER — LEVOFLOXACIN 750 MG PO TABS: 750.0000 mg | ORAL_TABLET | Freq: Every day | ORAL | Status: DC

## 2014-06-22 MED ORDER — LEVOFLOXACIN 750 MG PO TABS
750.0000 mg | ORAL_TABLET | Freq: Once | ORAL | Status: AC
  Administered 2014-06-22: 750 mg via ORAL
  Filled 2014-06-22: qty 1

## 2014-06-22 NOTE — Discharge Instructions (Signed)
Upper Respiratory Infection, Adult An upper respiratory infection (URI) is also sometimes known as the common cold. The upper respiratory tract includes the nose, sinuses, throat, trachea, and bronchi. Bronchi are the airways leading to the lungs. Most people improve within 1 week, but symptoms can last up to 2 weeks. A residual cough may last even longer.  CAUSES Many different viruses can infect the tissues lining the upper respiratory tract. The tissues become irritated and inflamed and often become very moist. Mucus production is also common. A cold is contagious. You can easily spread the virus to others by oral contact. This includes kissing, sharing a glass, coughing, or sneezing. Touching your mouth or nose and then touching a surface, which is then touched by another person, can also spread the virus. SYMPTOMS  Symptoms typically develop 1 to 3 days after you come in contact with a cold virus. Symptoms vary from person to person. They may include:  Runny nose.  Sneezing.  Nasal congestion.  Sinus irritation.  Sore throat.  Loss of voice (laryngitis).  Cough.  Fatigue.  Muscle aches.  Loss of appetite.  Headache.  Low-grade fever. DIAGNOSIS  You might diagnose your own cold based on familiar symptoms, since most people get a cold 2 to 3 times a year. Your caregiver can confirm this based on your exam. Most importantly, your caregiver can check that your symptoms are not due to another disease such as strep throat, sinusitis, pneumonia, asthma, or epiglottitis. Blood tests, throat tests, and X-rays are not necessary to diagnose a common cold, but they may sometimes be helpful in excluding other more serious diseases. Your caregiver will decide if any further tests are required. RISKS AND COMPLICATIONS  You may be at risk for a more severe case of the common cold if you smoke cigarettes, have chronic heart disease (such as heart failure) or lung disease (such as asthma), or if  you have a weakened immune system. The very young and very old are also at risk for more serious infections. Bacterial sinusitis, middle ear infections, and bacterial pneumonia can complicate the common cold. The common cold can worsen asthma and chronic obstructive pulmonary disease (COPD). Sometimes, these complications can require emergency medical care and may be life-threatening. PREVENTION  The best way to protect against getting a cold is to practice good hygiene. Avoid oral or hand contact with people with cold symptoms. Wash your hands often if contact occurs. There is no clear evidence that vitamin C, vitamin E, echinacea, or exercise reduces the chance of developing a cold. However, it is always recommended to get plenty of rest and practice good nutrition. TREATMENT  Treatment is directed at relieving symptoms. There is no cure. Antibiotics are not effective, because the infection is caused by a virus, not by bacteria. Treatment may include:  Increased fluid intake. Sports drinks offer valuable electrolytes, sugars, and fluids.  Breathing heated mist or steam (vaporizer or shower).  Eating chicken soup or other clear broths, and maintaining good nutrition.  Getting plenty of rest.  Using gargles or lozenges for comfort.  Controlling fevers with ibuprofen or acetaminophen as directed by your caregiver.  Increasing usage of your inhaler if you have asthma. Zinc gel and zinc lozenges, taken in the first 24 hours of the common cold, can shorten the duration and lessen the severity of symptoms. Pain medicines may help with fever, muscle aches, and throat pain. A variety of non-prescription medicines are available to treat congestion and runny nose. Your caregiver   can make recommendations and may suggest nasal or lung inhalers for other symptoms.  HOME CARE INSTRUCTIONS   Only take over-the-counter or prescription medicines for pain, discomfort, or fever as directed by your  caregiver.  Use a warm mist humidifier or inhale steam from a shower to increase air moisture. This may keep secretions moist and make it easier to breathe.  Drink enough water and fluids to keep your urine clear or pale yellow.  Rest as needed.  Return to work when your temperature has returned to normal or as your caregiver advises. You may need to stay home longer to avoid infecting others. You can also use a face mask and careful hand washing to prevent spread of the virus. SEEK MEDICAL CARE IF:   After the first few days, you feel you are getting worse rather than better.  You need your caregiver's advice about medicines to control symptoms.  You develop chills, worsening shortness of breath, or brown or red sputum. These may be signs of pneumonia.  You develop yellow or brown nasal discharge or pain in the face, especially when you bend forward. These may be signs of sinusitis.  You develop a fever, swollen neck glands, pain with swallowing, or white areas in the back of your throat. These may be signs of strep throat. SEEK IMMEDIATE MEDICAL CARE IF:   You have a fever.  You develop severe or persistent headache, ear pain, sinus pain, or chest pain.  You develop wheezing, a prolonged cough, cough up blood, or have a change in your usual mucus (if you have chronic lung disease).  You develop sore muscles or a stiff neck. Document Released: 03/15/2001 Document Revised: 12/12/2011 Document Reviewed: 12/25/2013 Exit[employer] Patient Information 2015 [employer]. This information is not intended to replace advice given to you by your health care provider. Make sure you discuss any questions you have with your health care provider.  

## 2014-06-25 ENCOUNTER — Ambulatory Visit (INDEPENDENT_AMBULATORY_CARE_PROVIDER_SITE_OTHER): Payer: Medicare Other | Admitting: Nurse Practitioner

## 2014-06-25 ENCOUNTER — Encounter: Payer: Self-pay | Admitting: Nurse Practitioner

## 2014-06-25 VITALS — BP 123/76 | HR 97 | Temp 97.9°F | Ht 64.0 in | Wt 200.0 lb

## 2014-06-25 DIAGNOSIS — Z124 Encounter for screening for malignant neoplasm of cervix: Secondary | ICD-10-CM | POA: Diagnosis not present

## 2014-06-25 DIAGNOSIS — Z01419 Encounter for gynecological examination (general) (routine) without abnormal findings: Secondary | ICD-10-CM

## 2014-06-25 LAB — POCT URINALYSIS DIPSTICK
Bilirubin, UA: NEGATIVE
GLUCOSE UA: NEGATIVE
Ketones, UA: NEGATIVE
Nitrite, UA: NEGATIVE
SPEC GRAV UA: 1.025
Urobilinogen, UA: NEGATIVE
pH, UA: 5

## 2014-06-25 LAB — POCT UA - MICROSCOPIC ONLY
CASTS, UR, LPF, POC: NEGATIVE
CRYSTALS, UR, HPF, POC: NEGATIVE
Mucus, UA: NEGATIVE
Yeast, UA: NEGATIVE

## 2014-06-25 NOTE — Progress Notes (Signed)
   Subjective:    Patient ID: Ariel [name], female    DOB: November 16, 1945, 68 y.o.   MRN: [id number]  HPI Patient is in today for repeat PAP- not enough tissue for test when done several weeks ago .    Review of Systems  All other systems reviewed and are negative.      Objective:   Physical Exam  Constitutional: She is oriented to person, place, and time. She appears well-developed and well-nourished.  Cardiovascular: Normal rate, regular rhythm and normal heart sounds.   Pulmonary/Chest: Effort normal and breath sounds normal.  Genitourinary:  Vaginal cuff intact No adnexal mass or tenderness  Neurological: She is alert and oriented to person, place, and time.  Skin: Skin is warm and dry.  Psychiatric: She has a normal mood and affect. Her behavior is normal. Judgment and thought content normal.    BP 123/76  Pulse 97  Temp(Src) 97.9 F (36.6 C) (Oral)  Ht  (1.626 m)  Wt 200 lb (90.719 kg)  BMI 34.31 kg/m2       Assessment & Plan:   1. Encounter for routine gynecological examination   2. Pap smear for cervical cancer screening    Will call with results  Mary-Margaret Daphine Deutscher, FNP

## 2014-06-25 NOTE — Patient Instructions (Signed)
Pap Test A Pap test is a procedure done in a clinic office to evaluate cells that are on the surface of the cervix. The cervix is the lower portion of the uterus and upper portion of the vagina. For some women, the cervical region has the potential to form cancer. With consistent evaluations by your caregiver, this type of cancer can be prevented.  If a Pap test is abnormal, it is most often a result of a previous exposure to human papillomavirus (HPV). HPV is a virus that can infect the cells of the cervix and cause dysplasia. Dysplasia is where the cells no longer look normal. If a woman has been diagnosed with high-grade or severe dysplasia, they are at higher risk of developing cervical cancer. People diagnosed with low-grade dysplasia should still be seen by their caregiver because there is a small chance that low-grade dysplasia could develop into cancer.  LET YOUR CAREGIVER KNOW ABOUT:  Recent sexually transmitted infection (STI) you have had.  Any new sex partners you have had.  History of previous abnormal Pap tests results.  History of previous cervical procedures you have had (colposcopy, biopsy, loop electrosurgical excision procedure [LEEP]).  Concerns you have had regarding unusual vaginal discharge.  History of pelvic pain.  Your use of birth control. BEFORE THE PROCEDURE  Ask your caregiver when to schedule your Pap test. It is best not to be on your period if your caregiver uses a wooden spatula to collect cells or applies cells to a glass slide. Newer techniques are not so sensitive to the timing of a menstrual cycle.  Do not douche or have sexual intercourse for 24 hours before the test.   Do not use vaginal creams or tampons for 24 hours before the test.   Empty your bladder just before the test to lessen any discomfort.  PROCEDURE You will lie on an exam table with your feet in stirrups. A warm metal or plastic instrument (speculum) is placed in your vagina. This  instrument allows your caregiver to see the inside of your vagina and look at your cervix. A small, plastic brush or wooden spatula is then used to collect cervical cells. These cells are placed in a lab specimen container. The cells are looked at under a microscope. A specialist will determine if the cells are normal.  AFTER THE PROCEDURE Make sure to get your test results.If your results come back abnormal, you may need further testing.  Document Released: 12/10/2002 Document Revised: 12/12/2011 Document Reviewed: 09/15/2011 ExitCare Patient Information 2015 [employer]. This information is not intended to replace advice given to you by your health care provider. Make sure you discuss any questions you have with your health care provider.  

## 2014-06-26 ENCOUNTER — Other Ambulatory Visit: Payer: Medicare Other

## 2014-06-26 ENCOUNTER — Other Ambulatory Visit: Payer: Self-pay | Admitting: Orthopedic Surgery

## 2014-06-26 ENCOUNTER — Ambulatory Visit (INDEPENDENT_AMBULATORY_CARE_PROVIDER_SITE_OTHER): Payer: Medicare Other

## 2014-06-26 DIAGNOSIS — R52 Pain, unspecified: Secondary | ICD-10-CM

## 2014-06-26 DIAGNOSIS — M1990 Unspecified osteoarthritis, unspecified site: Secondary | ICD-10-CM | POA: Diagnosis not present

## 2014-06-26 DIAGNOSIS — M25519 Pain in unspecified shoulder: Secondary | ICD-10-CM | POA: Diagnosis not present

## 2014-06-26 LAB — PAP IG (IMAGE GUIDED): PAP Smear Comment: 0

## 2014-06-27 ENCOUNTER — Telehealth: Payer: Self-pay | Admitting: Nurse Practitioner

## 2014-06-27 MED ORDER — IBUPROFEN 800 MG PO TABS: 800.0000 mg | ORAL_TABLET | Freq: Three times a day (TID) | ORAL | Status: DC | PRN

## 2014-06-27 NOTE — Telephone Encounter (Signed)
I sent the Rx to the pharmacy.

## 2014-06-28 NOTE — Telephone Encounter (Signed)
Patient aware rx sent into pharmacy. 

## 2014-07-22 ENCOUNTER — Ambulatory Visit: Payer: Medicare Other

## 2014-08-06 ENCOUNTER — Ambulatory Visit: Payer: Medicare Other

## 2014-08-19 ENCOUNTER — Ambulatory Visit: Payer: Medicare Other

## 2014-08-25 ENCOUNTER — Telehealth: Payer: Self-pay | Admitting: Nurse Practitioner

## 2014-08-25 NOTE — Telephone Encounter (Signed)
Stp advised pap results wnl, pt voiced understanding.

## 2014-09-01 ENCOUNTER — Telehealth: Payer: Self-pay | Admitting: *Deleted

## 2014-09-01 ENCOUNTER — Telehealth: Payer: Self-pay | Admitting: Nurse Practitioner

## 2014-09-01 DIAGNOSIS — I10 Essential (primary) hypertension: Secondary | ICD-10-CM

## 2014-09-01 MED ORDER — IBUPROFEN 800 MG PO TABS: 800.0000 mg | ORAL_TABLET | Freq: Three times a day (TID) | ORAL | Status: DC | PRN

## 2014-09-01 MED ORDER — SIMVASTATIN 40 MG PO TABS: 40.0000 mg | ORAL_TABLET | Freq: Every day | ORAL | Status: DC

## 2014-09-01 MED ORDER — VALSARTAN-HYDROCHLOROTHIAZIDE 160-25 MG PO TABS: ORAL_TABLET | ORAL | Status: DC

## 2014-09-01 NOTE — Telephone Encounter (Signed)
rx sent to pharmacy Patient NTBS for follow up and lab work   

## 2014-09-01 NOTE — Telephone Encounter (Signed)
Requesting refills.  Last visits were 04-29-14 for annual and 06-25-14 for gyn. Please advise.

## 2014-09-01 NOTE — Telephone Encounter (Signed)
Aware, scripts sent in and NTBS.

## 2014-10-21 ENCOUNTER — Other Ambulatory Visit: Payer: Self-pay | Admitting: Nurse Practitioner

## 2014-10-21 MED ORDER — SIMVASTATIN 40 MG PO TABS: 40.0000 mg | ORAL_TABLET | Freq: Every day | ORAL | Status: DC

## 2014-10-21 NOTE — Telephone Encounter (Signed)
done

## 2014-10-22 ENCOUNTER — Telehealth: Payer: Self-pay | Admitting: Nurse Practitioner

## 2014-10-22 DIAGNOSIS — I10 Essential (primary) hypertension: Secondary | ICD-10-CM

## 2014-10-22 MED ORDER — VALSARTAN-HYDROCHLOROTHIAZIDE 160-25 MG PO TABS: ORAL_TABLET | ORAL | Status: DC

## 2014-10-22 NOTE — Telephone Encounter (Signed)
Done 10/21/14, pt aware

## 2014-10-22 NOTE — Telephone Encounter (Signed)
done

## 2014-11-21 ENCOUNTER — Ambulatory Visit: Payer: Medicare Other | Admitting: Nurse Practitioner

## 2014-12-08 ENCOUNTER — Ambulatory Visit (INDEPENDENT_AMBULATORY_CARE_PROVIDER_SITE_OTHER): Payer: Medicare Other | Admitting: Nurse Practitioner

## 2014-12-08 ENCOUNTER — Encounter: Payer: Self-pay | Admitting: Nurse Practitioner

## 2014-12-08 VITALS — BP 112/72 | HR 77 | Temp 97.0°F | Ht 64.0 in | Wt 203.0 lb

## 2014-12-08 DIAGNOSIS — E785 Hyperlipidemia, unspecified: Secondary | ICD-10-CM

## 2014-12-08 DIAGNOSIS — Z23 Encounter for immunization: Secondary | ICD-10-CM

## 2014-12-08 DIAGNOSIS — E8881 Metabolic syndrome: Secondary | ICD-10-CM

## 2014-12-08 DIAGNOSIS — E669 Obesity, unspecified: Secondary | ICD-10-CM | POA: Diagnosis not present

## 2014-12-08 DIAGNOSIS — I10 Essential (primary) hypertension: Secondary | ICD-10-CM | POA: Diagnosis not present

## 2014-12-08 MED ORDER — SIMVASTATIN 40 MG PO TABS: 40.0000 mg | ORAL_TABLET | Freq: Every day | ORAL | Status: DC

## 2014-12-08 MED ORDER — VALSARTAN-HYDROCHLOROTHIAZIDE 160-25 MG PO TABS: ORAL_TABLET | ORAL | Status: DC

## 2014-12-08 MED ORDER — IBUPROFEN 800 MG PO TABS: 800.0000 mg | ORAL_TABLET | Freq: Three times a day (TID) | ORAL | Status: DC | PRN

## 2014-12-08 NOTE — Patient Instructions (Signed)

## 2014-12-08 NOTE — Addendum Note (Signed)
Addended by: Cleda DaubUCKER, Dequante Tremaine G on: 12/08/2014 11:46 AM   Modules accepted: Orders

## 2014-12-08 NOTE — Progress Notes (Signed)
   Subjective:    Patient ID: Ariel [name], female    DOB: 1946-09-22, 69 y.o.   MRN: [id number]  Hypertension This is a chronic problem. The current episode started more than 1 year ago. The problem is unchanged. The problem is controlled. Pertinent negatives include no chest pain, headaches, orthopnea, palpitations or shortness of breath. Risk factors for coronary artery disease include dyslipidemia, obesity, post-menopausal state and sedentary lifestyle. Past treatments include ACE inhibitors and diuretics. The current treatment provides moderate improvement. Compliance problems include diet and exercise.  There is no history of CAD/MI or CVA.  Hyperlipidemia This is a chronic problem. The current episode started more than 1 year ago. The problem is uncontrolled. Recent lipid tests were reviewed and are normal. Exacerbating diseases include obesity. She has no history of diabetes or hypothyroidism. Pertinent negatives include no chest pain or shortness of breath. Current antihyperlipidemic treatment includes statins. The current treatment provides moderate improvement of lipids. Compliance problems include adherence to diet and adherence to exercise.  Risk factors for coronary artery disease include dyslipidemia, hypertension, post-menopausal and obesity.  metabolic syndrome Trying to watch diet- has nit had hgba1c drawn- does not check blood sugars at home.   Review of Systems  Constitutional: Negative.   HENT: Negative.   Respiratory: Negative for shortness of breath.   Cardiovascular: Negative for chest pain, palpitations and orthopnea.  Neurological: Negative.  Negative for headaches.  Psychiatric/Behavioral: Negative.   All other systems reviewed and are negative.      Objective:   Physical Exam  Constitutional: She is oriented to person, place, and time. She appears well-developed and well-nourished.  HENT:  Nose: Nose normal.  Mouth/Throat: Oropharynx is clear and moist.  Eyes:  EOM are normal.  Neck: Trachea normal, normal range of motion and full passive range of motion without pain. Neck supple. No JVD present. Carotid bruit is not present. No thyromegaly present.  Cardiovascular: Normal rate, regular rhythm, normal heart sounds and intact distal pulses.  Exam reveals no gallop and no friction rub.   No murmur heard. Pulmonary/Chest: Effort normal and breath sounds normal.  Abdominal: Soft. Bowel sounds are normal. She exhibits no distension and no mass. There is no tenderness.  Musculoskeletal: Normal range of motion.  Lymphadenopathy:    She has no cervical adenopathy.  Neurological: She is alert and oriented to person, place, and time. She has normal reflexes.  Skin: Skin is warm and dry.  Psychiatric: She has a normal mood and affect. Her behavior is normal. Judgment and thought content normal.   BP 112/72 mmHg  Pulse 77  Temp(Src) 97 F (36.1 C) (Oral)  Ht _0  (1.626 m)  Wt 203 lb (92.08 kg)  BMI 34.83 kg/m2        Assessment & Plan:  1. Essential hypertension Do not add salt to diet - valsartan-hydrochlorothiazide (DIOVAN-HCT) 160-25 MG per tablet; Take 1/2 daily  Dispense: 30 tablet; Refill: 2 - CMP14+EGFR  2. HLD (hyperlipidemia) Low fat diet - simvastatin (ZOCOR) 40 MG tablet; Take 1 tablet (40 mg total) by mouth at bedtime.  Dispense: 30 tablet; Refill: 5 - NMR, lipoprofile  3. Metabolic syndrome    4. Obesity Discussed diet and exercise for person with BMI >25 Will recheck weight in 3-6 months     Labs pending Health maintenance reviewed Diet and exercise encouraged Continue all meds Follow up  In 3 months  [place], FNP

## 2014-12-09 LAB — CMP14+EGFR
A/G RATIO: 1.5 (ref 1.1–2.5)
ALT: 15 IU/L (ref 0–32)
AST: 17 IU/L (ref 0–40)
Albumin: 4.3 g/dL (ref 3.6–4.8)
Alkaline Phosphatase: 88 IU/L (ref 39–117)
BUN/Creatinine Ratio: 20 (ref 11–26)
BUN: 12 mg/dL (ref 8–27)
Bilirubin Total: <0.2 mg/dL (ref 0.0–1.2)
CALCIUM: 9.8 mg/dL (ref 8.7–10.3)
CHLORIDE: 101 mmol/L (ref 97–108)
CO2: 25 mmol/L (ref 18–29)
Creatinine, Ser: 0.6 mg/dL (ref 0.57–1.00)
GFR calc Af Amer: 108 mL/min/{1.73_m2} (ref >59)
GFR, EST NON AFRICAN AMERICAN: 94 mL/min/{1.73_m2} (ref >59)
GLUCOSE: 100 mg/dL — AB (ref 65–99)
Globulin, Total: 2.8 g/dL (ref 1.5–4.5)
POTASSIUM: 4.4 mmol/L (ref 3.5–5.2)
Sodium: 139 mmol/L (ref 134–144)
Total Protein: 7.1 g/dL (ref 6.0–8.5)

## 2014-12-09 LAB — NMR, LIPOPROFILE
CHOLESTEROL: 202 mg/dL — AB (ref 100–199)
HDL CHOLESTEROL BY NMR: 56 mg/dL (ref >39)
HDL Particle Number: 38.2 umol/L (ref >=30.5)
LDL PARTICLE NUMBER: 1440 nmol/L — AB (ref <1000)
LDL Size: 21.1 nm (ref >20.5)
LDL-C: 124 mg/dL — AB (ref 0–99)
LP-IR Score: 69 — ABNORMAL HIGH (ref <=45)
Small LDL Particle Number: 510 nmol/L (ref <=527)
Triglycerides by NMR: 111 mg/dL (ref 0–149)

## 2014-12-10 ENCOUNTER — Telehealth: Payer: Self-pay | Admitting: Nurse Practitioner

## 2014-12-10 DIAGNOSIS — I10 Essential (primary) hypertension: Secondary | ICD-10-CM

## 2014-12-10 DIAGNOSIS — E785 Hyperlipidemia, unspecified: Secondary | ICD-10-CM

## 2014-12-10 MED ORDER — VALSARTAN-HYDROCHLOROTHIAZIDE 160-25 MG PO TABS: ORAL_TABLET | ORAL | Status: DC

## 2014-12-10 MED ORDER — SIMVASTATIN 40 MG PO TABS: 40.0000 mg | ORAL_TABLET | Freq: Every day | ORAL | Status: DC

## 2014-12-10 NOTE — Telephone Encounter (Signed)
RXs sent into [facility] Pt notified

## 2014-12-22 DIAGNOSIS — M792 Neuralgia and neuritis, unspecified: Secondary | ICD-10-CM | POA: Diagnosis not present

## 2014-12-22 DIAGNOSIS — G5600 Carpal tunnel syndrome, unspecified upper limb: Secondary | ICD-10-CM | POA: Diagnosis not present

## 2014-12-22 DIAGNOSIS — R201 Hypoesthesia of skin: Secondary | ICD-10-CM | POA: Diagnosis not present

## 2015-02-25 ENCOUNTER — Other Ambulatory Visit: Payer: Self-pay | Admitting: Nurse Practitioner

## 2015-02-25 NOTE — Telephone Encounter (Signed)
NTBS.

## 2015-02-26 NOTE — Telephone Encounter (Signed)
Patient aware that she will need an appointment. Appointment given for Tuesday with Paulene FloorMary Martin, FNP

## 2015-03-03 ENCOUNTER — Ambulatory Visit: Payer: Medicare Other | Admitting: Nurse Practitioner

## 2015-03-13 DIAGNOSIS — G5610 Other lesions of median nerve, unspecified upper limb: Secondary | ICD-10-CM | POA: Diagnosis not present

## 2015-03-13 DIAGNOSIS — M79603 Pain in arm, unspecified: Secondary | ICD-10-CM | POA: Diagnosis not present

## 2015-03-23 DIAGNOSIS — Z1231 Encounter for screening mammogram for malignant neoplasm of breast: Secondary | ICD-10-CM | POA: Diagnosis not present

## 2015-03-24 DIAGNOSIS — Z1231 Encounter for screening mammogram for malignant neoplasm of breast: Secondary | ICD-10-CM | POA: Diagnosis not present

## 2015-04-13 ENCOUNTER — Encounter: Payer: Self-pay | Admitting: Family Medicine

## 2015-04-15 ENCOUNTER — Telehealth: Payer: Self-pay | Admitting: Family

## 2015-04-15 ENCOUNTER — Encounter: Payer: Self-pay | Admitting: Family

## 2015-04-15 ENCOUNTER — Ambulatory Visit (INDEPENDENT_AMBULATORY_CARE_PROVIDER_SITE_OTHER): Payer: Medicare Other | Admitting: Family

## 2015-04-15 VITALS — BP 121/77 | HR 81 | Temp 98.2°F | Ht 64.0 in | Wt 200.4 lb

## 2015-04-15 DIAGNOSIS — J069 Acute upper respiratory infection, unspecified: Secondary | ICD-10-CM

## 2015-04-15 MED ORDER — BENZONATATE 200 MG PO CAPS: 200.0000 mg | ORAL_CAPSULE | Freq: Three times a day (TID) | ORAL | Status: DC | PRN

## 2015-04-15 MED ORDER — HYDROCODONE-HOMATROPINE 5-1.5 MG/5ML PO SYRP: 5.0000 mL | ORAL_SOLUTION | Freq: Three times a day (TID) | ORAL | Status: DC | PRN

## 2015-04-15 MED ORDER — AZITHROMYCIN 250 MG PO TABS: ORAL_TABLET | ORAL | Status: DC

## 2015-04-15 NOTE — Telephone Encounter (Signed)
Patient advised that there is nothing to replace the tessalon pearles and I advised patient she could try robitussin, delsym, or mucinex otc.

## 2015-04-15 NOTE — Patient Instructions (Addendum)
Upper Respiratory Infection, Adult An upper respiratory infection (URI) is also sometimes known as the common cold. The upper respiratory tract includes the nose, sinuses, throat, trachea, and bronchi. Bronchi are the airways leading to the lungs. Most people improve within 1 week, but symptoms can last up to 2 weeks. A residual cough may last even longer.  CAUSES Many different viruses can infect the tissues lining the upper respiratory tract. The tissues become irritated and inflamed and often become very moist. Mucus production is also common. A cold is contagious. You can easily spread the virus to others by oral contact. This includes kissing, sharing a glass, coughing, or sneezing. Touching your mouth or nose and then touching a surface, which is then touched by another person, can also spread the virus. SYMPTOMS  Symptoms typically develop 1 to 3 days after you come in contact with a cold virus. Symptoms vary from person to person. They may include:  Runny nose.  Sneezing.  Nasal congestion.  Sinus irritation.  Sore throat.  Loss of voice (laryngitis).  Cough.  Fatigue.  Muscle aches.  Loss of appetite.  Headache.  Low-grade fever. DIAGNOSIS  You might diagnose your own cold based on familiar symptoms, since most people get a cold 2 to 3 times a year. Your caregiver can confirm this based on your exam. Most importantly, your caregiver can check that your symptoms are not due to another disease such as strep throat, sinusitis, pneumonia, asthma, or epiglottitis. Blood tests, throat tests, and X-rays are not necessary to diagnose a common cold, but they may sometimes be helpful in excluding other more serious diseases. Your caregiver will decide if any further tests are required. RISKS AND COMPLICATIONS  You may be at risk for a more severe case of the common cold if you smoke cigarettes, have chronic heart disease (such as heart failure) or lung disease (such as asthma), or if  you have a weakened immune system. The very young and very old are also at risk for more serious infections. Bacterial sinusitis, middle ear infections, and bacterial pneumonia can complicate the common cold. The common cold can worsen asthma and chronic obstructive pulmonary disease (COPD). Sometimes, these complications can require emergency medical care and may be life-threatening. PREVENTION  The best way to protect against getting a cold is to practice good hygiene. Avoid oral or hand contact with people with cold symptoms. Wash your hands often if contact occurs. There is no clear evidence that vitamin C, vitamin E, echinacea, or exercise reduces the chance of developing a cold. However, it is always recommended to get plenty of rest and practice good nutrition. TREATMENT  Treatment is directed at relieving symptoms. There is no cure. Antibiotics are not effective, because the infection is caused by a virus, not by bacteria. Treatment may include:  Increased fluid intake. Sports drinks offer valuable electrolytes, sugars, and fluids.  Breathing heated mist or steam (vaporizer or shower).  Eating chicken soup or other clear broths, and maintaining good nutrition.  Getting plenty of rest.  Using gargles or lozenges for comfort.  Controlling fevers with ibuprofen or acetaminophen as directed by your caregiver.  Increasing usage of your inhaler if you have asthma. Zinc gel and zinc lozenges, taken in the first 24 hours of the common cold, can shorten the duration and lessen the severity of symptoms. Pain medicines may help with fever, muscle aches, and throat pain. A variety of non-prescription medicines are available to treat congestion and runny nose. Your caregiver   can make recommendations and may suggest nasal or lung inhalers for other symptoms.  HOME CARE INSTRUCTIONS   Only take over-the-counter or prescription medicines for pain, discomfort, or fever as directed by your  caregiver.  Use a warm mist humidifier or inhale steam from a shower to increase air moisture. This may keep secretions moist and make it easier to breathe.  Drink enough water and fluids to keep your urine clear or pale yellow.  Rest as needed.  Return to work when your temperature has returned to normal or as your caregiver advises. You may need to stay home longer to avoid infecting others. You can also use a face mask and careful hand washing to prevent spread of the virus. SEEK MEDICAL CARE IF:   After the first few days, you feel you are getting worse rather than better.  You need your caregiver's advice about medicines to control symptoms.  You develop chills, worsening shortness of breath, or brown or red sputum. These may be signs of pneumonia.  You develop yellow or brown nasal discharge or pain in the face, especially when you bend forward. These may be signs of sinusitis.  You develop a fever, swollen neck glands, pain with swallowing, or white areas in the back of your throat. These may be signs of strep throat. SEEK IMMEDIATE MEDICAL CARE IF:   You have a fever.  You develop severe or persistent headache, ear pain, sinus pain, or chest pain.  You develop wheezing, a prolonged cough, cough up blood, or have a change in your usual mucus (if you have chronic lung disease).  You develop sore muscles or a stiff neck. Document Released: 03/15/2001 Document Revised: 12/12/2011 Document Reviewed: 12/25/2013 [employer] Patient Information 2015 [employer]. This information is not intended to replace advice given to you by your health care provider. Make sure you discuss any questions you have with your health care provider.  - Take meds as prescribed - Use a cool mist humidifier  -Use saline nose sprays frequently -Saline irrigations of the nose can be very helpful if done frequently.  * 4X daily for 1 week*  * Use of a nettie pot can be helpful with this. Follow  directions with this* -Force fluids -For any cough or congestion  Use plain Mucinex- regular strength or max strength is fine   * Children- consult with Pharmacist for dosing -For fever or aces or pains- take tylenol or ibuprofen appropriate for age and weight.  * for fevers greater than 101 orally you may alternate ibuprofen and tylenol every  3 hours. -Throat lozenges if help   Huriel Matt, FNP   

## 2015-04-15 NOTE — Progress Notes (Signed)
Subjective:    Patient ID: Ariel [name], female    DOB: 12/08/1945, 69 y.o.   MRN: [id number]  Cough This is a new problem. The current episode started in the past 7 days (Sunday). The problem has been gradually worsening. The problem occurs every few minutes. The cough is non-productive. Associated symptoms include chills, a fever, headaches, nasal congestion, postnasal drip, rhinorrhea, shortness of breath and wheezing. Pertinent negatives include no ear congestion, ear pain, hemoptysis, myalgias or sore throat. The symptoms are aggravated by lying down. She has tried rest (benadryl) for the symptoms. The treatment provided mild relief. There is no history of asthma or COPD.      Review of Systems  Constitutional: Positive for fever and chills.  HENT: Positive for postnasal drip and rhinorrhea. Negative for ear pain and sore throat.   Eyes: Negative.   Respiratory: Positive for cough, shortness of breath and wheezing. Negative for hemoptysis.   Cardiovascular: Negative.  Negative for palpitations.  Gastrointestinal: Negative.   Endocrine: Negative.   Genitourinary: Negative.   Musculoskeletal: Negative.  Negative for myalgias.  Neurological: Positive for headaches.  Hematological: Negative.   Psychiatric/Behavioral: Negative.   All other systems reviewed and are negative.      Objective:   Physical Exam  Constitutional: She is oriented to person, place, and time. She appears well-developed and well-nourished. No distress.  HENT:  Head: Normocephalic and atraumatic.  Right Ear: External ear normal.  Left Ear: External ear normal.  Nasal passage erythemas with mild swelling  Oropharynx erythemas  Eyes: Pupils are equal, round, and reactive to light.  Neck: Normal range of motion. Neck supple. No thyromegaly present.  Cardiovascular: Normal rate, regular rhythm, normal heart sounds and intact distal pulses.   No murmur heard. Pulmonary/Chest: Effort normal and breath sounds  normal. No respiratory distress. She has no wheezes.  Abdominal: Soft. Bowel sounds are normal. She exhibits no distension. There is no tenderness.  Musculoskeletal: Normal range of motion. She exhibits no edema or tenderness.  Neurological: She is alert and oriented to person, place, and time. She has normal reflexes. No cranial nerve deficit.  Skin: Skin is warm and dry.  Psychiatric: She has a normal mood and affect. Her behavior is normal. Judgment and thought content normal.  Vitals reviewed.     BP 121/77 mmHg  Pulse 81  Temp(Src) 98.2 F (36.8 C) (Oral)  Ht 5\' 4"  (1.626 m)  Wt 200 lb 6.4 oz (90.901 kg)  BMI 34.38 kg/m2     Assessment & Plan:  1. Acute upper respiratory infection -- Take meds as prescribed - Use a cool mist humidifier  -Use saline nose sprays frequently -Saline irrigations of the nose can be very helpful if done frequently.  * 4X daily for 1 week*  * Use of a nettie pot can be helpful with this. Follow directions with this* -Force fluids -For any cough or congestion  Use plain Mucinex- regular strength or max strength is fine   * Children- consult with Pharmacist for dosing -For fever or aces or pains- take tylenol or ibuprofen appropriate for age and weight.  * for fevers greater than 101 orally you may alternate ibuprofen and tylenol every  3 hours. -Throat lozenges if help - azithromycin (ZITHROMAX) 250 MG tablet; Take 500 mg once, then 250 mg for four days  Dispense: 6 tablet; Refill: 0 - benzonatate (TESSALON) 200 MG capsule; Take 1 capsule (200 mg total) by mouth 3 (three) times daily as needed.  Dispense: 30 capsule; Refill: 1 - HYDROcodone-homatropine (HYCODAN) 5-1.5 MG/5ML syrup; Take 5 mLs by mouth every 8 (eight) hours as needed for cough.  Dispense: 120 mL; Refill: 0  Evelina Dun, FNP

## 2015-04-16 DIAGNOSIS — M19012 Primary osteoarthritis, left shoulder: Secondary | ICD-10-CM | POA: Diagnosis not present

## 2015-04-16 DIAGNOSIS — G8929 Other chronic pain: Secondary | ICD-10-CM | POA: Diagnosis not present

## 2015-04-16 DIAGNOSIS — M25512 Pain in left shoulder: Secondary | ICD-10-CM | POA: Diagnosis not present

## 2015-04-17 ENCOUNTER — Other Ambulatory Visit (HOSPITAL_COMMUNITY): Payer: Self-pay | Admitting: Orthopedic Surgery

## 2015-04-17 DIAGNOSIS — M25512 Pain in left shoulder: Secondary | ICD-10-CM

## 2015-04-28 ENCOUNTER — Ambulatory Visit (HOSPITAL_COMMUNITY): Payer: Medicare Other

## 2015-05-01 ENCOUNTER — Ambulatory Visit: Payer: Medicare Other | Admitting: Physician Assistant

## 2015-05-04 ENCOUNTER — Encounter: Payer: Self-pay | Admitting: Family

## 2015-05-04 ENCOUNTER — Ambulatory Visit (INDEPENDENT_AMBULATORY_CARE_PROVIDER_SITE_OTHER): Payer: Medicare Other | Admitting: Family

## 2015-05-04 VITALS — BP 117/78 | HR 78 | Temp 97.1°F | Ht 64.0 in | Wt 196.4 lb

## 2015-05-04 DIAGNOSIS — J069 Acute upper respiratory infection, unspecified: Secondary | ICD-10-CM | POA: Diagnosis not present

## 2015-05-04 MED ORDER — METHYLPREDNISOLONE ACETATE 80 MG/ML IJ SUSP
80.0000 mg | Freq: Once | INTRAMUSCULAR | Status: AC
  Administered 2015-05-04: 80 mg via INTRAMUSCULAR

## 2015-05-04 NOTE — Progress Notes (Signed)
   Subjective:    Patient ID: Ariel [name], female    DOB: 08/03/1946, 69 y.o.   MRN: [id number]  Pt presents to the office for URI that has not resolved.  Cough This is a recurrent problem. The current episode started 1 to 4 weeks ago. The problem has been waxing and waning. The problem occurs every few minutes. The cough is productive of sputum and productive of brown sputum. Associated symptoms include headaches, nasal congestion, rhinorrhea, shortness of breath, sweats and wheezing. Pertinent negatives include no chills, ear congestion, ear pain or fever. The symptoms are aggravated by lying down. She has tried rest (Zpak) for the symptoms. The treatment provided mild relief. There is no history of asthma or COPD.      Review of Systems  Constitutional: Negative.  Negative for fever and chills.  HENT: Positive for rhinorrhea. Negative for ear pain.   Eyes: Negative.   Respiratory: Positive for cough, shortness of breath and wheezing.   Cardiovascular: Negative.  Negative for palpitations.  Gastrointestinal: Negative.   Endocrine: Negative.   Genitourinary: Negative.   Musculoskeletal: Negative.   Neurological: Positive for headaches.  Hematological: Negative.   Psychiatric/Behavioral: Negative.   All other systems reviewed and are negative.      Objective:   Physical Exam  Constitutional: She is oriented to person, place, and time. She appears well-developed and well-nourished. No distress.  HENT:  Head: Normocephalic and atraumatic.  Right Ear: External ear normal.  Left Ear: External ear normal.  Nose: Nose normal.  Oropharynx erythemas   Eyes: Pupils are equal, round, and reactive to light.  Neck: Normal range of motion. Neck supple. No thyromegaly present.  Cardiovascular: Normal rate, regular rhythm, normal heart sounds and intact distal pulses.   No murmur heard. Pulmonary/Chest: Effort normal and breath sounds normal. No respiratory distress. She has no wheezes.    Nonproductive cough   Abdominal: Soft. Bowel sounds are normal. She exhibits no distension. There is no tenderness.  Musculoskeletal: Normal range of motion. She exhibits no edema or tenderness.  Neurological: She is alert and oriented to person, place, and time. She has normal reflexes. No cranial nerve deficit.  Skin: Skin is warm and dry.  Psychiatric: She has a normal mood and affect. Her behavior is normal. Judgment and thought content normal.  Vitals reviewed.   BP 117/78 mmHg  Pulse 78  Temp(Src) 97.1 F (36.2 C) (Oral)  Ht  (1.626 m)  Wt 196 lb 6.4 oz (89.086 kg)  BMI 33.70 kg/m2       Assessment & Plan:  1. Acute upper respiratory infection -- Take meds as prescribed - Use a cool mist humidifier  -Use saline nose sprays frequently -Saline irrigations of the nose can be very helpful if done frequently.  * 4X daily for 1 week*  * Use of a nettie pot can be helpful with this. Follow directions with this* -Force fluids -For any cough or congestion  Use plain Mucinex- regular strength or max strength is fine   * Children- consult with Pharmacist for dosing -For fever or aces or pains- take tylenol or ibuprofen appropriate for age and weight.  * for fevers greater than 101 orally you may alternate ibuprofen and tylenol every  3 hours. -Throat lozenges if help - methylPREDNISolone acetate (DEPO-MEDROL) injection 80 mg; Inject 1 mL (80 mg total) into the muscle once.  Jannifer Rodney, FNP

## 2015-05-04 NOTE — Patient Instructions (Signed)
Upper Respiratory Infection, Adult An upper respiratory infection (URI) is also sometimes known as the common cold. The upper respiratory tract includes the nose, sinuses, throat, trachea, and bronchi. Bronchi are the airways leading to the lungs. Most people improve within 1 week, but symptoms can last up to 2 weeks. A residual cough may last even longer.  CAUSES Many different viruses can infect the tissues lining the upper respiratory tract. The tissues become irritated and inflamed and often become very moist. Mucus production is also common. A cold is contagious. You can easily spread the virus to others by oral contact. This includes kissing, sharing a glass, coughing, or sneezing. Touching your mouth or nose and then touching a surface, which is then touched by another person, can also spread the virus. SYMPTOMS  Symptoms typically develop 1 to 3 days after you come in contact with a cold virus. Symptoms vary from person to person. They may include:  Runny nose.  Sneezing.  Nasal congestion.  Sinus irritation.  Sore throat.  Loss of voice (laryngitis).  Cough.  Fatigue.  Muscle aches.  Loss of appetite.  Headache.  Low-grade fever. DIAGNOSIS  You might diagnose your own cold based on familiar symptoms, since most people get a cold 2 to 3 times a year. Your caregiver can confirm this based on your exam. Most importantly, your caregiver can check that your symptoms are not due to another disease such as strep throat, sinusitis, pneumonia, asthma, or epiglottitis. Blood tests, throat tests, and X-rays are not necessary to diagnose a common cold, but they may sometimes be helpful in excluding other more serious diseases. Your caregiver will decide if any further tests are required. RISKS AND COMPLICATIONS  You may be at risk for a more severe case of the common cold if you smoke cigarettes, have chronic heart disease (such as heart failure) or lung disease (such as asthma), or if  you have a weakened immune system. The very young and very old are also at risk for more serious infections. Bacterial sinusitis, middle ear infections, and bacterial pneumonia can complicate the common cold. The common cold can worsen asthma and chronic obstructive pulmonary disease (COPD). Sometimes, these complications can require emergency medical care and may be life-threatening. PREVENTION  The best way to protect against getting a cold is to practice good hygiene. Avoid oral or hand contact with people with cold symptoms. Wash your hands often if contact occurs. There is no clear evidence that vitamin C, vitamin E, echinacea, or exercise reduces the chance of developing a cold. However, it is always recommended to get plenty of rest and practice good nutrition. TREATMENT  Treatment is directed at relieving symptoms. There is no cure. Antibiotics are not effective, because the infection is caused by a virus, not by bacteria. Treatment may include:  Increased fluid intake. Sports drinks offer valuable electrolytes, sugars, and fluids.  Breathing heated mist or steam (vaporizer or shower).  Eating chicken soup or other clear broths, and maintaining good nutrition.  Getting plenty of rest.  Using gargles or lozenges for comfort.  Controlling fevers with ibuprofen or acetaminophen as directed by your caregiver.  Increasing usage of your inhaler if you have asthma. Zinc gel and zinc lozenges, taken in the first 24 hours of the common cold, can shorten the duration and lessen the severity of symptoms. Pain medicines may help with fever, muscle aches, and throat pain. A variety of non-prescription medicines are available to treat congestion and runny nose. Your caregiver   can make recommendations and may suggest nasal or lung inhalers for other symptoms.  HOME CARE INSTRUCTIONS   Only take over-the-counter or prescription medicines for pain, discomfort, or fever as directed by your  caregiver.  Use a warm mist humidifier or inhale steam from a shower to increase air moisture. This may keep secretions moist and make it easier to breathe.  Drink enough water and fluids to keep your urine clear or pale yellow.  Rest as needed.  Return to work when your temperature has returned to normal or as your caregiver advises. You may need to stay home longer to avoid infecting others. You can also use a face mask and careful hand washing to prevent spread of the virus. SEEK MEDICAL CARE IF:   After the first few days, you feel you are getting worse rather than better.  You need your caregiver's advice about medicines to control symptoms.  You develop chills, worsening shortness of breath, or brown or red sputum. These may be signs of pneumonia.  You develop yellow or brown nasal discharge or pain in the face, especially when you bend forward. These may be signs of sinusitis.  You develop a fever, swollen neck glands, pain with swallowing, or white areas in the back of your throat. These may be signs of strep throat. SEEK IMMEDIATE MEDICAL CARE IF:   You have a fever.  You develop severe or persistent headache, ear pain, sinus pain, or chest pain.  You develop wheezing, a prolonged cough, cough up blood, or have a change in your usual mucus (if you have chronic lung disease).  You develop sore muscles or a stiff neck. Document Released: 03/15/2001 Document Revised: 12/12/2011 Document Reviewed: 12/25/2013 Exit[employer] Patient Information 2015 [employer]. This information is not intended to replace advice given to you by your health care provider. Make sure you discuss any questions you have with your health care provider.  - Take meds as prescribed - Use a cool mist humidifier  -Use saline nose sprays frequently -Saline irrigations of the nose can be very helpful if done frequently.  * 4X daily for 1 week*  * Use of a nettie pot can be helpful with this. Follow  directions with this* -Force fluids -For any cough or congestion  Use plain Mucinex- regular strength or max strength is fine   * Children- consult with Pharmacist for dosing -For fever or aces or pains- take tylenol or ibuprofen appropriate for age and weight.  * for fevers greater than 101 orally you may alternate ibuprofen and tylenol every  3 hours. -Throat lozenges if help   Ariel Bunyan, FNP   

## 2015-05-08 ENCOUNTER — Other Ambulatory Visit (HOSPITAL_COMMUNITY): Payer: Medicare Other

## 2015-06-30 ENCOUNTER — Ambulatory Visit: Payer: Medicare Other | Admitting: Pediatrics

## 2015-07-20 ENCOUNTER — Ambulatory Visit: Payer: Medicare Other | Admitting: Pediatrics

## 2015-07-28 ENCOUNTER — Ambulatory Visit: Payer: Medicare Other | Admitting: Pediatrics

## 2015-08-19 ENCOUNTER — Telehealth: Payer: Self-pay | Admitting: Nurse Practitioner

## 2015-09-02 ENCOUNTER — Ambulatory Visit (INDEPENDENT_AMBULATORY_CARE_PROVIDER_SITE_OTHER): Payer: Medicare Other | Admitting: Family Medicine

## 2015-09-02 ENCOUNTER — Encounter: Payer: Self-pay | Admitting: Family Medicine

## 2015-09-02 VITALS — BP 127/75 | HR 81 | Temp 98.1°F | Ht 64.0 in | Wt 203.0 lb

## 2015-09-02 DIAGNOSIS — H109 Unspecified conjunctivitis: Secondary | ICD-10-CM | POA: Diagnosis not present

## 2015-09-02 MED ORDER — TOBRAMYCIN-DEXAMETHASONE 0.3-0.1 % OP SUSP: OPHTHALMIC | Status: DC

## 2015-09-02 NOTE — Progress Notes (Signed)
Subjective:  Patient ID: Ariel [name], female    DOB: 08/25/1946  Age: 69 y.o. MRN: [id number]  CC: eye irritation   HPI [place] Chilton SiGreen presents for 2 days of irritation in the eyes. It seems to be getting worse. It has not affected her vision. She is tearing some and has some mucoid discharge as well. Primarily however it's just watery and painful. She has had a little bit of nasal congestion and sore throat but no fever chills or sweats or other signs of respiratory illness.  History Aubrianna has a past medical history of Hypertension; Hyperlipidemia; Diverticulosis; and Osteopenia.   She has past surgical history that includes Abdominal hysterectomy (1985) and Carpal tunnel release (Right, 2007).   Her family history includes Cancer (age of onset: 4980) in her father; Diabetes in her mother.She reports that she has never smoked. She does not have any smokeless tobacco history on file. She reports that she drinks alcohol. She reports that she does not use illicit drugs.  Current Outpatient Prescriptions on File Prior to Visit  Medication Sig Dispense Refill  . ibuprofen (ADVIL,MOTRIN) 800 MG tablet Take 1 tablet (800 mg total) by mouth every 8 (eight) hours as needed. 30 tablet 1  . simvastatin (ZOCOR) 40 MG tablet Take 1 tablet (40 mg total) by mouth at bedtime. 90 tablet 1  . traMADol (ULTRAM) 50 MG tablet Take by mouth every 6 (six) hours as needed. for pain.  0  . valsartan-hydrochlorothiazide (DIOVAN-HCT) 160-25 MG per tablet Take 1/2 daily 90 tablet 1   Current Facility-Administered Medications on File Prior to Visit  Medication Dose Route Frequency Provider Last Rate Last Dose  . levalbuterol (XOPENEX) nebulizer solution 1.25 mg  1.25 mg Nebulization Once Deatra CanterWilliam J Oxford, FNP   1.25 mg at 12/26/13 1445    ROS Review of Systems  Constitutional: Negative for fever, chills, diaphoresis, appetite change and fatigue.  HENT: Negative for congestion, ear pain, hearing loss, postnasal  drip, rhinorrhea, sore throat and trouble swallowing.   Eyes: Positive for pain, discharge and redness.  Respiratory: Negative for cough.   Musculoskeletal: Negative for arthralgias.  Skin: Negative for rash.    Objective:  BP 127/75 mmHg  Pulse 81  Temp(Src) 98.1 F (36.7 C) (Oral)  Ht 5\' 4"  (1.626 m)  Wt 203 lb (92.08 kg)  BMI 34.83 kg/m2  SpO2 99%  BP Readings from Last 3 Encounters:  09/02/15 127/75  05/04/15 117/78  04/15/15 121/77    Wt Readings from Last 3 Encounters:  09/02/15 203 lb (92.08 kg)  05/04/15 196 lb 6.4 oz (89.086 kg)  04/15/15 200 lb 6.4 oz (90.901 kg)     Physical Exam  Constitutional: She appears well-developed and well-nourished.  HENT:  Head: Normocephalic and atraumatic.  Right Ear: Tympanic membrane and external ear normal. No decreased hearing is noted.  Left Ear: Tympanic membrane and external ear normal. No decreased hearing is noted.  Nose: Mucosal edema present. Right sinus exhibits no frontal sinus tenderness. Left sinus exhibits no frontal sinus tenderness.  Mouth/Throat: No oropharyngeal exudate or posterior oropharyngeal erythema.  Eyes: EOM are normal. Pupils are equal, round, and reactive to light. Right eye exhibits discharge. Left eye exhibits discharge.  Slight mucoid exudate noted at the medial epicanthus bilaterally.  Neck: No Brudzinski's sign noted.  Pulmonary/Chest: Breath sounds normal. No respiratory distress.    No results found for: HGBA1C  Lab Results  Component Value Date   WBC 5.5 06/21/2014   HGB 12.9 06/21/2014  HCT 38.0 06/21/2014   PLT 272 06/21/2014   GLUCOSE 100* 12/08/2014   CHOL 202* 12/08/2014   TRIG 111 12/08/2014   HDL 56 12/08/2014   LDLCALC 141* 04/29/2014   ALT 15 12/08/2014   AST 17 12/08/2014   NA 139 12/08/2014   K 4.4 12/08/2014   CL 101 12/08/2014   CREATININE 0.60 12/08/2014   BUN 12 12/08/2014   CO2 25 12/08/2014   TSH 1.370 04/29/2014    Dg Chest 2 View  06/21/2014   CLINICAL DATA:  Chest pain, shortness of breath. EXAM: CHEST  2 VIEW COMPARISON:  Chest radiograph October 16, 2012 FINDINGS: Pain Cardiomediastinal silhouette is unremarkable. The lungs are clear without pleural effusions or focal consolidations. Trachea projects midline and there is no pneumothorax. Soft tissue planes and included osseous structures are non-suspicious. IMPRESSION: No acute cardiopulmonary process. Electronically Signed   By: Awilda Metro   On: 06/21/2014 22:20   Ct Angio Chest Pe W/cm &/or Wo Cm  06/22/2014  CLINICAL DATA:  Left-sided chest pain EXAM: CT ANGIOGRAPHY CHEST WITH CONTRAST TECHNIQUE: Multidetector CT imaging of the chest was performed using the standard protocol during bolus administration of intravenous contrast. Multiplanar CT image reconstructions and MIPs were obtained to evaluate the vascular anatomy. CONTRAST:  OMNIPAQUE IOHEXOL 350 MG/ML SOLN COMPARISON:  None. FINDINGS: There are no filling defects within the pulmonary arteries to suggest acute pulmonary embolism. No acute findings of the aorta or great vessels. No pericardial fluid. Esophagus. Review of the lung parenchyma demonstrates mild diffuse ground-glass opacities and interlobular septal thickening. No pleural fluid. No axillary supraclavicular adenopathy.  No mediastinal. No free fluid the pelvis.  No aggressive osseous lesion Review of the MIP images confirms the above findings. IMPRESSION: 1. No evidence acute pulmonary embolism. 2. Mild ground-glass opacities suggest mild pulmonary edema or atelectasis. Electronically Signed   By: Genevive Bi M.D.   On: 06/22/2014 00:01    Assessment & Plan:   Luanne was seen today for eye irritation.  Diagnoses and all orders for this visit:  Conjunctivitis of left eye  Other orders -     tobramycin-dexamethasone (TOBRADEX) ophthalmic solution; Apply 1 drop in affected eye(s) every 2 hours for two days. Then every 4 hours for 5 days.   I am  having Ms. [name] start on tobramycin-dexamethasone. I am also having her maintain her ibuprofen, simvastatin, valsartan-hydrochlorothiazide, and traMADol. We will continue to administer levalbuterol.  Meds ordered this encounter  Medications  . tobramycin-dexamethasone (TOBRADEX) ophthalmic solution    Sig: Apply 1 drop in affected eye(s) every 2 hours for two days. Then every 4 hours for 5 days.    Dispense:  5 mL    Refill:  0     Follow-up: No Follow-up on file.  Mechele Claude, M.D.

## 2015-09-14 ENCOUNTER — Other Ambulatory Visit: Payer: Self-pay | Admitting: Nurse Practitioner

## 2015-10-02 ENCOUNTER — Ambulatory Visit (HOSPITAL_COMMUNITY)
Admission: RE | Admit: 2015-10-02 | Discharge: 2015-10-02 | Disposition: A | Discharge status: Home / Self Care | Payer: Medicare Other | Source: Ambulatory Visit | Attending: Orthopedic Surgery | Admitting: Orthopedic Surgery

## 2015-10-02 DIAGNOSIS — M7582 Other shoulder lesions, left shoulder: Secondary | ICD-10-CM | POA: Diagnosis not present

## 2015-10-02 DIAGNOSIS — M7532 Calcific tendinitis of left shoulder: Secondary | ICD-10-CM | POA: Insufficient documentation

## 2015-10-02 DIAGNOSIS — M75102 Unspecified rotator cuff tear or rupture of left shoulder, not specified as traumatic: Secondary | ICD-10-CM | POA: Diagnosis not present

## 2015-10-02 DIAGNOSIS — M25512 Pain in left shoulder: Secondary | ICD-10-CM | POA: Insufficient documentation

## 2015-10-22 ENCOUNTER — Encounter: Payer: Self-pay | Admitting: Nurse Practitioner

## 2015-10-22 ENCOUNTER — Ambulatory Visit (INDEPENDENT_AMBULATORY_CARE_PROVIDER_SITE_OTHER): Payer: Medicare Other

## 2015-10-22 ENCOUNTER — Ambulatory Visit (INDEPENDENT_AMBULATORY_CARE_PROVIDER_SITE_OTHER): Payer: Medicare Other | Admitting: Nurse Practitioner

## 2015-10-22 VITALS — BP 113/73 | HR 87 | Temp 96.7°F | Ht 64.0 in | Wt 198.0 lb

## 2015-10-22 DIAGNOSIS — E785 Hyperlipidemia, unspecified: Secondary | ICD-10-CM | POA: Diagnosis not present

## 2015-10-22 DIAGNOSIS — I10 Essential (primary) hypertension: Secondary | ICD-10-CM

## 2015-10-22 DIAGNOSIS — Z1212 Encounter for screening for malignant neoplasm of rectum: Secondary | ICD-10-CM

## 2015-10-22 DIAGNOSIS — Z1159 Encounter for screening for other viral diseases: Secondary | ICD-10-CM | POA: Diagnosis not present

## 2015-10-22 DIAGNOSIS — Z780 Asymptomatic menopausal state: Secondary | ICD-10-CM

## 2015-10-22 DIAGNOSIS — E8881 Metabolic syndrome: Secondary | ICD-10-CM | POA: Diagnosis not present

## 2015-10-22 DIAGNOSIS — E669 Obesity, unspecified: Secondary | ICD-10-CM | POA: Diagnosis not present

## 2015-10-22 MED ORDER — SIMVASTATIN 40 MG PO TABS: 40.0000 mg | ORAL_TABLET | Freq: Every day | ORAL | Status: DC

## 2015-10-22 MED ORDER — VALSARTAN-HYDROCHLOROTHIAZIDE 160-25 MG PO TABS: ORAL_TABLET | ORAL | Status: DC

## 2015-10-22 NOTE — Progress Notes (Signed)
Subjective:    Patient ID: Ariel [name], female    DOB: [birth year], 70 y.o.   MRN: [id number]  Patient here today for follow up of chronic medical problems.  Outpatient Encounter Prescriptions as of 10/22/2015  Medication Sig  . ibuprofen (ADVIL,MOTRIN) 800 MG tablet TAKE 1 TABLET 3 TIMES A DAY AS NEEDED  . simvastatin (ZOCOR) 40 MG tablet Take 1 tablet (40 mg total) by mouth at bedtime.  Marland Kitchen tobramycin-dexamethasone (TOBRADEX) ophthalmic solution Apply 1 drop in affected eye(s) every 2 hours for two days. Then every 4 hours for 5 days.  . traMADol (ULTRAM) 50 MG tablet Take by mouth every 6 (six) hours as needed. for pain.  . valsartan-hydrochlorothiazide (DIOVAN-HCT) 160-25 MG per tablet Take 1/2 daily   Facility-Administered Encounter Medications as of 10/22/2015  Medication  . levalbuterol (XOPENEX) nebulizer solution 1.25 mg     Hypertension This is a chronic problem. The current episode started more than 1 year ago. The problem is unchanged. The problem is controlled. Pertinent negatives include no chest pain, headaches, orthopnea, palpitations or shortness of breath. Risk factors for coronary artery disease include dyslipidemia, obesity, post-menopausal state and sedentary lifestyle. Past treatments include ACE inhibitors and diuretics. The current treatment provides moderate improvement. Compliance problems include diet and exercise.  There is no history of CAD/MI or CVA.  Hyperlipidemia This is a chronic problem. The current episode started more than 1 year ago. The problem is uncontrolled. Recent lipid tests were reviewed and are normal. Exacerbating diseases include obesity. She has no history of diabetes or hypothyroidism. Pertinent negatives include no chest pain or shortness of breath. Current antihyperlipidemic treatment includes statins. The current treatment provides moderate improvement of lipids. Compliance problems include adherence to diet and adherence to exercise.  Risk  factors for coronary artery disease include dyslipidemia, hypertension, post-menopausal and obesity.  metabolic syndrome Trying to watch diet- has nit had hgba1c drawn- does not check blood sugars at home.   Review of Systems  Constitutional: Negative.   HENT: Negative.   Respiratory: Negative for shortness of breath.   Cardiovascular: Negative for chest pain, palpitations and orthopnea.  Neurological: Negative.  Negative for headaches.  Psychiatric/Behavioral: Negative.   All other systems reviewed and are negative.      Objective:   Physical Exam  Constitutional: She is oriented to person, place, and time. She appears well-developed and well-nourished.  HENT:  Nose: Nose normal.  Mouth/Throat: Oropharynx is clear and moist.  Eyes: EOM are normal.  Neck: Trachea normal, normal range of motion and full passive range of motion without pain. Neck supple. No JVD present. Carotid bruit is not present. No thyromegaly present.  Cardiovascular: Normal rate, regular rhythm, normal heart sounds and intact distal pulses.  Exam reveals no gallop and no friction rub.   No murmur heard. Pulmonary/Chest: Effort normal and breath sounds normal.  Abdominal: Soft. Bowel sounds are normal. She exhibits no distension and no mass. There is no tenderness.  Musculoskeletal: Normal range of motion.  Lymphadenopathy:    She has no cervical adenopathy.  Neurological: She is alert and oriented to person, place, and time. She has normal reflexes.  Skin: Skin is warm and dry.  Psychiatric: She has a normal mood and affect. Her behavior is normal. Judgment and thought content normal.    BP 113/73 mmHg  Pulse 87  Temp(Src) 96.7 F (35.9 C) (Oral)  Ht _0  (1.626 m)  Wt 198 lb (89.812 kg)  BMI 33.97 kg/m2  EKG- NSR  with Cruz Condon, FNP   Chest x ray- no active cardiopulmonary disease-Preliminary reading by Ronnald Collum, FNP  [facility]       Assessment & Plan:  1. Essential  hypertension Do not add salt to diet - valsartan-hydrochlorothiazide (DIOVAN-HCT) 160-25 MG tablet; Take 1/2 daily  Dispense: 90 tablet; Refill: 1 - CMP14+EGFR - DG Chest 2 View; Future - EKG 12-Lead  2. HLD (hyperlipidemia) Low fat diet and exercise - simvastatin (ZOCOR) 40 MG tablet; Take 1 tablet (40 mg total) by mouth at bedtime.  Dispense: 90 tablet; Refill: 1 - Lipid panel  3. Metabolic syndrome Continue to watch carbs in diet  4. Obesity Discussed diet and exercise for person with BMI >25 Will recheck weight in 3-6 months  5. Screening for malignant neoplasm of the rectum - Fecal occult blood, imunochemical; Future  6. Need for hepatitis C screening test - Hepatitis C antibody  7. Menopause Weight bearing exercises encouraged - DG Bone Density; Future    Labs pending Health maintenance reviewed Diet and exercise encouraged Continue all meds Follow up  In 3 months   [place], FNP

## 2015-10-22 NOTE — Patient Instructions (Signed)
Bone Health Bones protect organs, store calcium, and anchor muscles. Good health habits, such as eating nutritious foods and exercising regularly, are important for maintaining healthy bones. They can also help to prevent a condition that causes bones to lose density and become weak and brittle (osteoporosis). WHY IS BONE MASS IMPORTANT? Bone mass refers to the amount of bone tissue that you have. The higher your bone mass, the stronger your bones. An important step toward having healthy bones throughout life is to have strong and dense bones during childhood. A young adult who has a high bone mass is more likely to have a high bone mass later in life. Bone mass at its greatest it is called peak bone mass. A large decline in bone mass occurs in older adults. In women, it occurs about the time of menopause. During this time, it is important to practice good health habits, because if more bone is lost than what is replaced, the bones will become less healthy and more likely to break (fracture). If you find that you have a low bone mass, you may be able to prevent osteoporosis or further bone loss by changing your diet and lifestyle. HOW CAN I FIND OUT IF MY BONE MASS IS LOW? Bone mass can be measured with an X-ray test that is called a bone mineral density (BMD) test. This test is recommended for all women who are age 65 or older. It may also be recommended for men who are age 70 or older, or for people who are more likely to develop osteoporosis due to:  Having bones that break easily.  Having a long-term disease that weakens bones, such as kidney disease or rheumatoid arthritis.  Having menopause earlier than normal.  Taking medicine that weakens bones, such as steroids, thyroid hormones, or hormone treatment for breast cancer or prostate cancer.  Smoking.  Drinking three or more alcoholic drinks each day. WHAT ARE THE NUTRITIONAL RECOMMENDATIONS FOR HEALTHY BONES? To have healthy bones, you need  to get enough of the right minerals and vitamins. Most nutrition experts recommend getting these nutrients from the foods that you eat. Nutritional recommendations vary from person to person. Ask your health care provider what is healthy for you. Here are some general guidelines. Calcium Recommendations Calcium is the most important (essential) mineral for bone health. Most people can get enough calcium from their diet, but supplements may be recommended for people who are at risk for osteoporosis. Good sources of calcium include:  Dairy products, such as low-fat or nonfat milk, cheese, and yogurt.  Dark green leafy vegetables, such as bok choy and broccoli.  Calcium-fortified foods, such as orange juice, cereal, bread, soy beverages, and tofu products.  Nuts, such as almonds. Follow these recommended amounts for daily calcium intake:  Children, age 1-3: 700 mg.  Children, age 4-8: 1,000 mg.  Children, age 9-13: 1,300 mg.  Teens, age 14-18: 1,300 mg.  Adults, age 19-50: 1,000 mg.  Adults, age 51-70:  Men: 1,000 mg.  Women: 1,200 mg.  Adults, age 71 or older: 1,200 mg.  Pregnant and breastfeeding females:  Teens: 1,300 mg.  Adults: 1,000 mg. Vitamin D Recommendations Vitamin D is the most essential vitamin for bone health. It helps the body to absorb calcium. Sunlight stimulates the skin to make vitamin D, so be sure to get enough sunlight. If you live in a cold climate or you do not get outside often, your health care provider may recommend that you take vitamin D supplements. Good   sources of vitamin D in your diet include:  Egg yolks.  Saltwater fish.  Milk and cereal fortified with vitamin D. Follow these recommended amounts for daily vitamin D intake:  Children and teens, age 1-18: 600 international units.  Adults, age 50 or younger: 400-800 international units.  Adults, age 51 or older: 800-1,000 international units. Other Nutrients Other nutrients for bone  health include:  Phosphorus. This mineral is found in meat, poultry, dairy foods, nuts, and legumes. The recommended daily intake for adult men and adult women is 700 mg.  Magnesium. This mineral is found in seeds, nuts, dark green vegetables, and legumes. The recommended daily intake for adult men is 400-420 mg. For adult women, it is 310-320 mg.  Vitamin K. This vitamin is found in green leafy vegetables. The recommended daily intake is 120 mg for adult men and 90 mg for adult women. WHAT TYPE OF PHYSICAL ACTIVITY IS BEST FOR BUILDING AND MAINTAINING HEALTHY BONES? Weight-bearing and strength-building activities are important for building and maintaining peak bone mass. Weight-bearing activities cause muscles and bones to work against gravity. Strength-building activities increases muscle strength that supports bones. Weight-bearing and muscle-building activities include:  Walking and hiking.  Jogging and running.  Dancing.  Gym exercises.  Lifting weights.  Tennis and racquetball.  Climbing stairs.  Aerobics. Adults should get at least 30 minutes of moderate physical activity on most days. Children should get at least 60 minutes of moderate physical activity on most days. Ask your health care provide what type of exercise is best for you. WHERE CAN I FIND MORE INFORMATION? For more information, check out the following websites:  National Osteoporosis Foundation: http://nof.org/learn/basics  National Institutes of Health: http://www.niams.nih.gov/Health_Info/Bone/Bone_Health/bone_health_for_life.asp   This information is not intended to replace advice given to you by your health care provider. Make sure you discuss any questions you have with your health care provider.   Document Released: 12/10/2003 Document Revised: 02/03/2015 Document Reviewed: 09/24/2014 [employer] Interactive Patient Education 2016 [employer]  

## 2015-10-23 LAB — CMP14+EGFR
ALBUMIN: 4.3 g/dL (ref 3.6–4.8)
ALK PHOS: 86 IU/L (ref 39–117)
ALT: 17 IU/L (ref 0–32)
AST: 19 IU/L (ref 0–40)
Albumin/Globulin Ratio: 1.4 (ref 1.1–2.5)
BUN / CREAT RATIO: 24 (ref 11–26)
BUN: 21 mg/dL (ref 8–27)
Bilirubin Total: <0.2 mg/dL (ref 0.0–1.2)
CO2: 22 mmol/L (ref 18–29)
Calcium: 9.6 mg/dL (ref 8.7–10.3)
Chloride: 102 mmol/L (ref 96–106)
Creatinine, Ser: 0.88 mg/dL (ref 0.57–1.00)
GFR calc Af Amer: 78 mL/min/{1.73_m2} (ref >59)
GFR calc non Af Amer: 67 mL/min/{1.73_m2} (ref >59)
Globulin, Total: 3.1 g/dL (ref 1.5–4.5)
Glucose: 111 mg/dL — ABNORMAL HIGH (ref 65–99)
Potassium: 4.1 mmol/L (ref 3.5–5.2)
Sodium: 142 mmol/L (ref 134–144)
Total Protein: 7.4 g/dL (ref 6.0–8.5)

## 2015-10-23 LAB — LIPID PANEL
Chol/HDL Ratio: 3.7 ratio units (ref 0.0–4.4)
Cholesterol, Total: 183 mg/dL (ref 100–199)
HDL: 49 mg/dL (ref >39)
LDL Calculated: 112 mg/dL — ABNORMAL HIGH (ref 0–99)
Triglycerides: 108 mg/dL (ref 0–149)
VLDL CHOLESTEROL CAL: 22 mg/dL (ref 5–40)

## 2015-10-23 LAB — HEPATITIS C ANTIBODY: Hep C Virus Ab: <0.1 s/co ratio (ref 0.0–0.9)

## 2015-11-03 DIAGNOSIS — M79603 Pain in arm, unspecified: Secondary | ICD-10-CM | POA: Diagnosis not present

## 2015-11-03 DIAGNOSIS — I10 Essential (primary) hypertension: Secondary | ICD-10-CM | POA: Diagnosis not present

## 2015-11-03 DIAGNOSIS — E785 Hyperlipidemia, unspecified: Secondary | ICD-10-CM | POA: Diagnosis not present

## 2015-11-03 DIAGNOSIS — G5600 Carpal tunnel syndrome, unspecified upper limb: Secondary | ICD-10-CM | POA: Diagnosis not present

## 2015-11-10 ENCOUNTER — Encounter: Payer: Self-pay | Admitting: *Deleted

## 2016-01-05 ENCOUNTER — Ambulatory Visit: Payer: Medicare Other | Admitting: Orthopaedic Surgery

## 2016-01-08 ENCOUNTER — Other Ambulatory Visit: Payer: Self-pay | Admitting: Family Medicine

## 2016-01-20 ENCOUNTER — Ambulatory Visit: Payer: Medicare Other | Admitting: Orthopaedic Surgery

## 2016-02-03 ENCOUNTER — Ambulatory Visit (INDEPENDENT_AMBULATORY_CARE_PROVIDER_SITE_OTHER): Payer: Medicare Other | Admitting: Orthopaedic Surgery

## 2016-02-03 ENCOUNTER — Encounter: Payer: Self-pay | Admitting: Orthopaedic Surgery

## 2016-02-03 VITALS — BP 119/74 | HR 92 | Temp 97.5°F | Resp 16 | Ht 64.0 in | Wt 194.0 lb

## 2016-02-03 DIAGNOSIS — I10 Essential (primary) hypertension: Secondary | ICD-10-CM | POA: Diagnosis not present

## 2016-02-03 DIAGNOSIS — G5602 Carpal tunnel syndrome, left upper limb: Secondary | ICD-10-CM | POA: Diagnosis not present

## 2016-02-03 MED ORDER — HYDROCODONE-ACETAMINOPHEN 5-325 MG PO TABS: 1.0000 | ORAL_TABLET | ORAL | Status: DC | PRN

## 2016-02-03 NOTE — Progress Notes (Signed)
Subjective: I have carpal tunnel on my left hand    Patient ID: Ariel BirchwoodRosetta Green, female    DOB: 10/29/1945, 70 y.o.   MRN: [id number]  HPI She has history of carpal tunnel released in [place] several years ago on the right side.  She has done well from that.  She was told she would need release on the left but put it off.  She has moved here now. She has been followed by Dr. Gerilyn Pilgrimoonquah for carpal tunnel left.   He has injected the left wrist area several times.  She says she is not much better.  He has referred her here for possible surgery.  I have reviewed his notes.  She has more nocturnal pain but the pain during the day is now present.  She drops things.  Nothing seems to help now.   Review of Systems  HENT: Negative for congestion.   Respiratory: Negative for cough and shortness of breath.   Cardiovascular: Negative for chest pain and leg swelling.  Endocrine: Positive for cold intolerance.  Musculoskeletal: Positive for arthralgias.  Allergic/Immunologic: Positive for environmental allergies.       Past Medical History  Diagnosis Date  . Hypertension   . Hyperlipidemia   . Diverticulosis   . Osteopenia     Past Surgical History  Procedure Laterality Date  . Abdominal hysterectomy  1985  . Carpal tunnel release Right 2007    Current Outpatient Prescriptions on File Prior to Visit  Medication Sig Dispense Refill  . ibuprofen (ADVIL,MOTRIN) 800 MG tablet TAKE 1 TABLET 3 TIMES A DAY AS NEEDED 90 tablet 1  . simvastatin (ZOCOR) 40 MG tablet Take 1 tablet (40 mg total) by mouth at bedtime. 90 tablet 1  . valsartan-hydrochlorothiazide (DIOVAN-HCT) 160-25 MG tablet Take 1/2 daily 90 tablet 1   Current Facility-Administered Medications on File Prior to Visit  Medication Dose Route Frequency Provider Last Rate Last Dose  . levalbuterol (XOPENEX) nebulizer solution 1.25 mg  1.25 mg Nebulization Once Deatra CanterWilliam J Oxford, FNP   1.25 mg at 12/26/13 1445    Social History   Social  History  . Marital Status: Widowed    Spouse Name: N/A  . Number of Children: N/A  . Years of Education: N/A   Occupational History  . Not on file.   Social History Main Topics  . Smoking status: Never Smoker   . Smokeless tobacco: Not on file  . Alcohol Use: Yes     Comment: social  . Drug Use: No  . Sexual Activity: Not on file   Other Topics Concern  . Not on file   Social History Narrative    BP 119/74 mmHg  Pulse 92  Temp(Src) 97.5 F (36.4 C)  Resp 16  Ht 5\' 4"  (1.626 m)  Wt 194 lb (87.998 kg)  BMI 33.28 kg/m2   Objective:   Physical Exam  Constitutional: She is oriented to person, place, and time. She appears well-developed and well-nourished.  HENT:  Head: Normocephalic and atraumatic.  Eyes: Conjunctivae and EOM are normal. Pupils are equal, round, and reactive to light.  Neck: Normal range of motion. Neck supple.  Cardiovascular: Normal rate, regular rhythm and intact distal pulses.   Pulmonary/Chest: Effort normal.  Abdominal: Soft.  Musculoskeletal: She exhibits tenderness (Pain left wrist, postiive Tinel, positive Phalen, decreased sensatoin median nerve distribution, healed scar right wrist area.).  Neurological: She is alert and oriented to person, place, and time. She displays normal reflexes. No cranial  nerve deficit. She exhibits normal muscle tone. Coordination normal.  Skin: Skin is warm and dry.  Psychiatric: She has a normal mood and affect. Her behavior is normal. Judgment and thought content normal.      Assessment & Plan:   Encounter Diagnoses  Name Primary?  . Carpal tunnel syndrome, left Yes  . Essential hypertension    I have gone over the surgery as an outpatient for carpal tunnel.  I will have Dr. Romeo Apple see her for the surgery.  She asked appropriate questions and agrees.  Call if any problem.

## 2016-03-10 ENCOUNTER — Ambulatory Visit (INDEPENDENT_AMBULATORY_CARE_PROVIDER_SITE_OTHER): Payer: Medicare Other | Admitting: Orthopedic Surgery

## 2016-03-10 VITALS — BP 106/71 | HR 87 | Ht 63.0 in | Wt 192.2 lb

## 2016-03-10 DIAGNOSIS — G5602 Carpal tunnel syndrome, left upper limb: Secondary | ICD-10-CM | POA: Diagnosis not present

## 2016-03-10 DIAGNOSIS — M778 Other enthesopathies, not elsewhere classified: Secondary | ICD-10-CM | POA: Diagnosis not present

## 2016-03-10 MED ORDER — HYDROCODONE-ACETAMINOPHEN 5-325 MG PO TABS: 1.0000 | ORAL_TABLET | ORAL | Status: DC | PRN

## 2016-03-10 NOTE — Patient Instructions (Signed)
Carpal Tunnel Release  Carpal tunnel release is a surgical procedure to relieve numbness and pain in your hand that are caused by carpal tunnel syndrome. Your carpal tunnel is a narrow, hollow space in your wrist. It passes between your wrist bones and a band of connective tissue (transverse carpal ligament). The nerve that supplies most of your hand (median nerve) passes through this space, and so do the connections between your fingers and the muscles of your arm (tendons). Carpal tunnel syndrome makes this space swell and become narrow, and this causes pain and numbness.  In carpal tunnel release surgery, a surgeon cuts through the transverse carpal ligament to make more room in the carpal tunnel space. You may have this surgery if other types of treatment have not worked.  LET YOUR HEALTH CARE PROVIDER KNOW ABOUT:  · Any allergies you have.  · All medicines you are taking, including vitamins, herbs, eye drops, creams, and over-the-counter medicines.  · Previous problems you or members of your family have had with the use of anesthetics.  · Any blood disorders you have.  · Previous surgeries you have had.  · Medical conditions you have.  RISKS AND COMPLICATIONS  Generally, this is a safe procedure. However, problems may occur, including:  · Bleeding.  · Infection.  · Injury to the median nerve.  · Need for additional surgery.  BEFORE THE PROCEDURE  · Ask your health care provider about:    Changing or stopping your regular medicines. This is especially important if you are taking diabetes medicines or blood thinners.    Taking medicines such as aspirin and ibuprofen. These medicines can thin your blood. Do not take these medicines before your procedure if your health care provider instructs you not to.  · Do not eat or drink anything after midnight on the night before the procedure or as directed by your health care provider.  · Plan to have someone take you home after the procedure.  PROCEDURE  · An IV tube may  be inserted into a vein.  · You will be given one of the following:    A medicine that numbs the wrist area (local anesthetic). You may also be given a medicine to make you relax (sedative).    A medicine that makes you go to sleep (general anesthetic).  · Your arm, hand, and wrist will be cleaned with a germ-killing solution (antiseptic).  · Your surgeon will make a surgical cut (incision) over the palm side of your wrist. The surgeon will pull aside the skin of your wrist to expose the carpal tunnel space.  · The surgeon will cut the transverse carpal ligament.  · The edges of the incision will be closed with stitches (sutures) or staples.  · A bandage (dressing) will be placed over your wrist and wrapped around your hand and wrist.  AFTER THE PROCEDURE  · You may spend some time in a recovery area.  · Your blood pressure, heart rate, breathing rate, and blood oxygen level will be monitored often until the medicines you were given have worn off.  · You will likely have some pain. You will be given pain medicine.  · You may need to wear a splint or a wrist brace over your dressing.     This information is not intended to replace advice given to you by your health care provider. Make sure you discuss any questions you have with your health care provider.     Document Released:   12/10/2003 Document Revised: 10/10/2014 Document Reviewed: 05/07/2014  Elsevier Interactive Patient Education ©2016 Elsevier Inc.

## 2016-03-13 NOTE — Progress Notes (Signed)
Patient ID: Ariel [name], female   DOB: 03/27/1946, 70 y.o.   MRN: [id number]  Chief Complaint  Patient presents with  . Follow-up    Discuss left carpal tunnel    HPI Ariel [name] is a 70 y.o. female.   She has history of carpal tunnel released in [place] several years ago on the right side. She has done well from that. She was told she would need release on the left but put it off. She has moved here now. She has been followed by Dr. Gerilyn Pilgrimoonquah for carpal tunnel left. He has injected the left wrist area several times. She says she is not much better. He has referred her here for possible surgery. I have reviewed his notes.  She has more nocturnal pain but the pain during the day is now present. She drops things. Nothing seems to help now.  She c/o pain over the left carpal tunnel with numbness and tingling of the left hand all fingers and pain over the fcr and fcu  Review of Systems Review of Systems  Constitutional: Negative for fever, chills and fatigue.  Musculoskeletal: Negative for gait problem.  Skin: Negative for color change.  Neurological: Positive for numbness.     Past Medical History  Diagnosis Date  . Hypertension   . Hyperlipidemia   . Diverticulosis   . Osteopenia     Past Surgical History  Procedure Laterality Date  . Abdominal hysterectomy  1985  . Carpal tunnel release Right 2007    Family History  Problem Relation Age of Onset  . Diabetes Mother   . Cancer Father [id number]    colon    Social History Social History  Substance Use Topics  . Smoking status: Never Smoker   . Smokeless tobacco: Not on file  . Alcohol Use: Yes     Comment: social    No Known Allergies  Current Outpatient Prescriptions  Medication Sig Dispense Refill  . simvastatin (ZOCOR) 40 MG tablet Take 1 tablet (40 mg total) by mouth at bedtime. 90 tablet 1  . valsartan-hydrochlorothiazide (DIOVAN-HCT) 160-25 MG tablet Take 1/2 daily 90 tablet 1  .  HYDROcodone-acetaminophen (NORCO/VICODIN) 5-325 MG tablet Take 1 tablet by mouth every 4 (four) hours as needed for moderate pain (Must last 15 days.Do not take and drive a car or use machinery.). 42 tablet 0  . ibuprofen (ADVIL,MOTRIN) 800 MG tablet TAKE 1 TABLET 3 TIMES A DAY AS NEEDED (Patient not taking: Reported on 03/10/2016) 90 tablet 1   Current Facility-Administered Medications  Medication Dose Route Frequency Provider Last Rate Last Dose  . levalbuterol (XOPENEX) nebulizer solution 1.25 mg  1.25 mg Nebulization Once Deatra CanterWilliam J Oxford, FNP   1.25 mg at 12/26/13 1445     Physical Exam Blood pressure 106/71, pulse 87, height 5\' 3"  (1.6 m), weight 192 lb 3.2 oz (87.181 kg). Physical Exam The patient is well developed well nourished and well groomed.  Orientation to person place and time is normal  Mood is pleasant.  Ambulatory status normal, no limp, no cane   left Upper extremity examination reveals the following:  Inspection reveals no swelling. There is tenderness over the carpal tunnel; and flexor tendons   Range of motion of the wrist and elbow are normal  Motor exam shows mild weakness with grip strength.  Wrist joint is stable  Provocative tests for carpal tunnel Phalen's test  positive Carpal tunnel compression test positive  Pulses are normal in the radial and ulnar artery with  a normal Allen's test.  Decreased sensation is noted in the median nerve distribution. Soft touch is normal.  Opposite extremity Normal rom, stability and strength   Data Reviewed  Assessment    left CTS    Plan     Surgical release left carpal tunnel   This procedure has been fully reviewed with the patient and written informed consent has been obtained.

## 2016-03-14 NOTE — Patient Instructions (Signed)
Ariel [name]  03/14/2016     @PREFPERIOPPHARMACY @   Your procedure is scheduled on  03/17/2016   [facility]  [facility]  [facility].M.  Call this number if you have problems the morning of surgery:  [phone]   Remember:  Do not eat food or drink liquids after midnight.  Take these medicines the morning of surgery with A SIP OF WATER  Vicodin, diovan.   Do not wear jewelry, make-up or nail polish.  Do not wear lotions, powders, or perfumes.  You may wear deodorant.  Do not shave 48 hours prior to surgery.  Men may shave face and neck.  Do not bring valuables to the hospital.  [facility] is not responsible for any belongings or valuables.  Contacts, dentures or bridgework may not be worn into surgery.  Leave your suitcase in the car.  After surgery it may be brought to your room.  For patients admitted to the hospital, discharge time will be determined by your treatment team.  Patients discharged the day of surgery will not be allowed to drive home.   Name and phone number of your driver:   family Special instructions:  none  Please read over the following fact sheets that you were given. Coughing and Deep Breathing, Surgical Site Infection Prevention, Anesthesia Post-op Instructions and Care and Recovery After Surgery      Carpal Tunnel Release Carpal tunnel release is a surgical procedure to relieve numbness and pain in your hand that are caused by carpal tunnel syndrome. Your carpal tunnel is a narrow, hollow space in your wrist. It passes between your wrist bones and a band of connective tissue (transverse carpal ligament). The nerve that supplies most of your hand (median nerve) passes through this space, and so do the connections between your fingers and the muscles of your arm (tendons). Carpal tunnel syndrome makes this space swell and become narrow, and this causes pain and numbness. In carpal tunnel release surgery, a surgeon cuts through the transverse  carpal ligament to make more room in the carpal tunnel space. You may have this surgery if other types of treatment have not worked. LET [facility] PROVID[facility] KNOW ABOUT:  Any allergies you have.  All medicines you are taking, including vitamins, herbs, eye drops, creams, and over-the-counter medicines.  Previous problems you or members of your family have had with the use of anesthetics.  Any blood disorders you have.  Previous surgeries you have had.  Medical conditions you have. RISKS AND COMPLICATIONS Generally, this is a safe procedure. However, problems may occur, including:  Bleeding.  Infection.  Injury to the median nerve.  Need for additional surgery. BEFORE THE PROCEDURE  Ask your health care provider about:  Changing or stopping your regular medicines. This is especially important if you are taking diabetes medicines or blood thinners.  Taking medicines such as aspirin and ibuprofen. These medicines can thin your blood. Do not take these medicines before your procedure if your health care provider instructs you not to.  Do not eat or drink anything after midnight on the night before the procedure or as directed by your health care provider.  Plan to have someone take you home after the procedure. PROCEDURE  An IV tube may be inserted into a vein.  You will be given one of the following:  A medicine that numbs the wrist area (local anesthetic). You may also be given a medicine to  make you relax (sedative).  A medicine that makes you go to sleep (general anesthetic).  Your arm, hand, and wrist will be cleaned with a germ-killing solution (antiseptic).  Your surgeon will make a surgical cut (incision) over the palm side of your wrist. The surgeon will pull aside the skin of your wrist to expose the carpal tunnel space.  The surgeon will cut the transverse carpal ligament.  The edges of the incision will be closed with stitches (sutures) or staples.  A  bandage (dressing) will be placed over your wrist and wrapped around your hand and wrist. AFTER THE PROCEDURE  You may spend some time in a recovery area.  Your blood pressure, heart rate, breathing rate, and blood oxygen level will be monitored often until the medicines you were given have worn off.  You will likely have some pain. You will be given pain medicine.  You may need to wear a splint or a wrist brace over your dressing.   This information is not intended to replace advice given to you by your health care provider. Make sure you discuss any questions you have with your health care provider.   Document Released: 12/10/2003 Document Revised: 10/10/2014 Document Reviewed: 05/07/2014 Elsevier Interactive Patient Education 2016 Elsevier Inc. Carpal Tunnel Release, Care After Refer to this sheet in the next few weeks. These instructions provide you with information about caring for yourself after your procedure. Your health care provider may also give you more specific instructions. Your treatment has been planned according to current medical practices, but problems sometimes occur. Call your health care provider if you have any problems or questions after your procedure. WHAT TO EXPECT AFTER THE PROCEDURE After your procedure, it is typical to have the following:  Pain.  Numbness.  Tingling.  Swelling.  Stiffness.  Bruising. HOME CARE INSTRUCTIONS  Take medicines only as directed by your health care provider.  There are many different ways to close and cover an incision, including stitches (sutures), skin glue, and adhesive strips. Follow your health care provider's instructions about:  Incision care.  Bandage (dressing) changes and removal.  Incision closure removal.  Wear a splint or a brace as directed by your surgeon. You may need to do this for 2-3 weeks.  Keep your hand raised (elevated) above the level of your heart while you are resting. Move your fingers  often.  Avoid activities that cause hand pain.  Ask your surgeon when you can start to do all of your usual activities again, such as:  Driving.  Returning to work.  Bathing and swimming.  Keep all follow-up visits as directed by your health care provider. This is important. You may need physical therapy for several months to speed healing and regain movement. SEEK MEDICAL CARE IF:  You have drainage, redness, swelling, or pain at your incision site.  You have a fever.  You have chills.  Your pain medicine is not working.  Your symptoms do not go away after 2 months.  Your symptoms go away and then return. SEEK IMMEDIATE MEDICAL CARE IF:  You have pain or numbness that is getting worse.  Your fingers change color.  You are not able to move your fingers.   This information is not intended to replace advice given to you by your health care provider. Make sure you discuss any questions you have with your health care provider.   Document Released: 04/08/2005 Document Revised: 10/10/2014 Document Reviewed: 05/07/2014 Elsevier Interactive Patient Education [employer]2016 Elsevier Inc.  PATIENT INSTRUCTIONS POST-ANESTHESIA  IMMEDIATELY FOLLOWING SURGERY:  Do not drive or operate machinery for the first twenty four hours after surgery.  Do not make any important decisions for twenty four hours after surgery or while taking narcotic pain medications or sedatives.  If you develop intractable nausea and vomiting or a severe headache please notify your doctor immediately.  FOLLOW-UP:  Please make an appointment with your surgeon as instructed. You do not need to follow up with anesthesia unless specifically instructed to do so.  WOUND CARE INSTRUCTIONS (if applicable):  Keep a dry clean dressing on the anesthesia/puncture wound site if there is drainage.  Once the wound has quit draining you may leave it open to air.  Generally you should leave the bandage intact for twenty four hours unless  there is drainage.  If the epidural site drains for more than 36-48 hours please call the anesthesia department.  QUESTIONS?:  Please feel free to call your physician or the hospital operator if you have any questions, and they will be happy to assist you.

## 2016-03-15 ENCOUNTER — Encounter (HOSPITAL_COMMUNITY): Payer: Self-pay

## 2016-03-15 ENCOUNTER — Encounter (HOSPITAL_COMMUNITY)
Admission: RE | Admit: 2016-03-15 | Discharge: 2016-03-15 | Disposition: A | Discharge status: Home / Self Care | Payer: Medicare Other | Source: Ambulatory Visit | Attending: Orthopedic Surgery | Admitting: Orthopedic Surgery

## 2016-03-15 DIAGNOSIS — Z01812 Encounter for preprocedural laboratory examination: Secondary | ICD-10-CM | POA: Diagnosis not present

## 2016-03-15 DIAGNOSIS — G5602 Carpal tunnel syndrome, left upper limb: Secondary | ICD-10-CM | POA: Diagnosis not present

## 2016-03-15 DIAGNOSIS — E785 Hyperlipidemia, unspecified: Secondary | ICD-10-CM | POA: Diagnosis not present

## 2016-03-15 DIAGNOSIS — Z79899 Other long term (current) drug therapy: Secondary | ICD-10-CM | POA: Diagnosis not present

## 2016-03-15 DIAGNOSIS — I10 Essential (primary) hypertension: Secondary | ICD-10-CM | POA: Diagnosis not present

## 2016-03-15 HISTORY — DX: Unspecified osteoarthritis, unspecified site: M19.90

## 2016-03-15 LAB — CBC WITH DIFFERENTIAL/PLATELET
BASOS ABS: 0 10*3/uL (ref 0.0–0.1)
Basophils Relative: 1 %
Eosinophils Absolute: 0.1 10*3/uL (ref 0.0–0.7)
Eosinophils Relative: 2 %
HEMATOCRIT: 37.8 % (ref 36.0–46.0)
Hemoglobin: 12.4 g/dL (ref 12.0–15.0)
LYMPHS ABS: 2.8 10*3/uL (ref 0.7–4.0)
LYMPHS PCT: 47 %
MCH: 27.8 pg (ref 26.0–34.0)
MCHC: 32.8 g/dL (ref 30.0–36.0)
MCV: 84.8 fL (ref 78.0–100.0)
MONO ABS: 0.7 10*3/uL (ref 0.1–1.0)
Monocytes Relative: 12 %
NEUTROS ABS: 2.3 10*3/uL (ref 1.7–7.7)
Neutrophils Relative %: 38 %
Platelets: 286 10*3/uL (ref 150–400)
RBC: 4.46 MIL/uL (ref 3.87–5.11)
RDW: 13.7 % (ref 11.5–15.5)
WBC: 6 10*3/uL (ref 4.0–10.5)

## 2016-03-15 LAB — BASIC METABOLIC PANEL
ANION GAP: 5 (ref 5–15)
BUN: 17 mg/dL (ref 6–20)
CALCIUM: 9.7 mg/dL (ref 8.9–10.3)
CO2: 25 mmol/L (ref 22–32)
CREATININE: 0.6 mg/dL (ref 0.44–1.00)
Chloride: 107 mmol/L (ref 101–111)
GFR calc Af Amer: >60 mL/min (ref >60)
Glucose, Bld: 93 mg/dL (ref 65–99)
Potassium: 3.8 mmol/L (ref 3.5–5.1)
Sodium: 137 mmol/L (ref 135–145)

## 2016-03-15 NOTE — Pre-Procedure Instructions (Signed)
Patient given information to sign up for my chart at home. 

## 2016-03-17 ENCOUNTER — Encounter (HOSPITAL_COMMUNITY)
Admission: RE | Disposition: A | Discharge status: Home / Self Care | Payer: Self-pay | Source: Ambulatory Visit | Attending: Orthopedic Surgery

## 2016-03-17 ENCOUNTER — Ambulatory Visit (HOSPITAL_COMMUNITY): Payer: Medicare Other | Admitting: Anesthesiology

## 2016-03-17 ENCOUNTER — Ambulatory Visit (HOSPITAL_COMMUNITY)
Admission: RE | Admit: 2016-03-17 | Discharge: 2016-03-17 | Disposition: A | Discharge status: Home / Self Care | Payer: Medicare Other | Source: Ambulatory Visit | Attending: Orthopedic Surgery | Admitting: Orthopedic Surgery

## 2016-03-17 ENCOUNTER — Encounter (HOSPITAL_COMMUNITY): Payer: Self-pay | Admitting: Anesthesiology

## 2016-03-17 DIAGNOSIS — G5602 Carpal tunnel syndrome, left upper limb: Secondary | ICD-10-CM | POA: Diagnosis not present

## 2016-03-17 DIAGNOSIS — Z79899 Other long term (current) drug therapy: Secondary | ICD-10-CM | POA: Diagnosis not present

## 2016-03-17 DIAGNOSIS — I10 Essential (primary) hypertension: Secondary | ICD-10-CM | POA: Insufficient documentation

## 2016-03-17 DIAGNOSIS — E785 Hyperlipidemia, unspecified: Secondary | ICD-10-CM | POA: Diagnosis not present

## 2016-03-17 DIAGNOSIS — Z01812 Encounter for preprocedural laboratory examination: Secondary | ICD-10-CM | POA: Diagnosis not present

## 2016-03-17 HISTORY — PX: CARPAL TUNNEL RELEASE: SHX101

## 2016-03-17 SURGERY — CARPAL TUNNEL RELEASE
Anesthesia: Regional | Laterality: Left

## 2016-03-17 MED ORDER — FENTANYL CITRATE (PF) 100 MCG/2ML IJ SOLN
INTRAMUSCULAR | Status: AC
  Filled 2016-03-17: qty 2

## 2016-03-17 MED ORDER — FENTANYL CITRATE (PF) 100 MCG/2ML IJ SOLN: 25.0000 ug | INTRAMUSCULAR | Status: DC | PRN

## 2016-03-17 MED ORDER — CEFAZOLIN SODIUM-DEXTROSE 2-4 GM/100ML-% IV SOLN: 2.0000 g | INTRAVENOUS | Status: DC

## 2016-03-17 MED ORDER — PROPOFOL 10 MG/ML IV BOLUS
INTRAVENOUS | Status: AC
  Filled 2016-03-17: qty 20

## 2016-03-17 MED ORDER — PROPOFOL 500 MG/50ML IV EMUL
INTRAVENOUS | Status: DC | PRN
  Administered 2016-03-17: 35 ug/kg/min via INTRAVENOUS

## 2016-03-17 MED ORDER — FENTANYL CITRATE (PF) 100 MCG/2ML IJ SOLN
INTRAMUSCULAR | Status: DC | PRN
  Administered 2016-03-17 (×2): 25 ug via INTRAVENOUS

## 2016-03-17 MED ORDER — MIDAZOLAM HCL 2 MG/2ML IJ SOLN
INTRAMUSCULAR | Status: AC
  Filled 2016-03-17: qty 2

## 2016-03-17 MED ORDER — CHLORHEXIDINE GLUCONATE 4 % EX LIQD: 60.0000 mL | Freq: Once | CUTANEOUS | Status: DC

## 2016-03-17 MED ORDER — SUCCINYLCHOLINE CHLORIDE 20 MG/ML IJ SOLN
INTRAMUSCULAR | Status: AC
  Filled 2016-03-17: qty 1

## 2016-03-17 MED ORDER — LACTATED RINGERS IV SOLN
INTRAVENOUS | Status: DC
  Administered 2016-03-17: 07:00:00 via INTRAVENOUS

## 2016-03-17 MED ORDER — LIDOCAINE HCL (PF) 0.5 % IJ SOLN
INTRAMUSCULAR | Status: DC | PRN
  Administered 2016-03-17: 50 mL via INTRAVENOUS

## 2016-03-17 MED ORDER — PROPOFOL 10 MG/ML IV BOLUS
INTRAVENOUS | Status: AC
Start: 2016-03-17 — End: 2016-03-17
  Filled 2016-03-17: qty 20

## 2016-03-17 MED ORDER — ONDANSETRON HCL 4 MG/2ML IJ SOLN
4.0000 mg | Freq: Once | INTRAMUSCULAR | Status: DC | PRN
Start: 2016-03-17 — End: 2016-03-17

## 2016-03-17 MED ORDER — 0.9 % SODIUM CHLORIDE (POUR BTL) OPTIME
TOPICAL | Status: DC | PRN
  Administered 2016-03-17: 1000 mL

## 2016-03-17 MED ORDER — LIDOCAINE HCL (PF) 1 % IJ SOLN
INTRAMUSCULAR | Status: AC
Start: 2016-03-17 — End: 2016-03-17
  Filled 2016-03-17: qty 5

## 2016-03-17 MED ORDER — HYDROCODONE-ACETAMINOPHEN 5-325 MG PO TABS: 1.0000 | ORAL_TABLET | ORAL | Status: DC | PRN

## 2016-03-17 MED ORDER — CEFAZOLIN SODIUM-DEXTROSE 2-4 GM/100ML-% IV SOLN
INTRAVENOUS | Status: AC
  Filled 2016-03-17: qty 100

## 2016-03-17 MED ORDER — MIDAZOLAM HCL 5 MG/5ML IJ SOLN
INTRAMUSCULAR | Status: DC | PRN
  Administered 2016-03-17 (×2): 1 mg via INTRAVENOUS

## 2016-03-17 MED ORDER — FENTANYL CITRATE (PF) 100 MCG/2ML IJ SOLN
25.0000 ug | INTRAMUSCULAR | Status: AC | PRN
  Administered 2016-03-17 (×2): 25 ug via INTRAVENOUS

## 2016-03-17 MED ORDER — MIDAZOLAM HCL 2 MG/2ML IJ SOLN
1.0000 mg | INTRAMUSCULAR | Status: DC | PRN
  Administered 2016-03-17: 2 mg via INTRAVENOUS

## 2016-03-17 MED ORDER — BUPIVACAINE HCL (PF) 0.5 % IJ SOLN
INTRAMUSCULAR | Status: DC | PRN
  Administered 2016-03-17: 10 mL

## 2016-03-17 MED ORDER — LIDOCAINE HCL (PF) 0.5 % IJ SOLN
INTRAMUSCULAR | Status: AC
  Filled 2016-03-17: qty 50

## 2016-03-17 MED ORDER — BUPIVACAINE HCL (PF) 0.5 % IJ SOLN
INTRAMUSCULAR | Status: AC
Start: 2016-03-17 — End: 2016-03-17
  Filled 2016-03-17: qty 30

## 2016-03-17 SURGICAL SUPPLY — 37 items
BAG HAMPER (MISCELLANEOUS) ×2 IMPLANT
BANDAGE ELASTIC 3 LF NS (GAUZE/BANDAGES/DRESSINGS) ×2 IMPLANT
BANDAGE ESMARK 4X12 BL STRL LF (DISPOSABLE) ×1 IMPLANT
BLADE SURG 15 STRL LF DISP TIS (BLADE) ×1 IMPLANT
BLADE SURG 15 STRL SS (BLADE) ×1
BNDG COHESIVE 4X5 TAN STRL (GAUZE/BANDAGES/DRESSINGS) ×2 IMPLANT
BNDG ESMARK 4X12 BLUE STRL LF (DISPOSABLE) ×2
BNDG GAUZE ELAST 4 BULKY (GAUZE/BANDAGES/DRESSINGS) ×2 IMPLANT
CHLORAPREP W/TINT 26ML (MISCELLANEOUS) ×2 IMPLANT
CLOTH BEACON ORANGE TIMEOUT ST (SAFETY) ×2 IMPLANT
COVER LIGHT HANDLE STERIS (MISCELLANEOUS) ×4 IMPLANT
CUFF TOURNIQUET SINGLE 18IN (TOURNIQUET CUFF) ×2 IMPLANT
DECANTER SPIKE VIAL GLASS SM (MISCELLANEOUS) ×2 IMPLANT
DRAPE PROXIMA HALF (DRAPES) ×2 IMPLANT
DRSG XEROFORM 1X8 (GAUZE/BANDAGES/DRESSINGS) ×2 IMPLANT
ELECT NEEDLE TIP 2.8 STRL (NEEDLE) ×2 IMPLANT
ELECT REM PT RETURN 9FT ADLT (ELECTROSURGICAL) ×2
ELECTRODE REM PT RTRN 9FT ADLT (ELECTROSURGICAL) ×1 IMPLANT
GAUZE SPONGE 4X4 12PLY STRL (GAUZE/BANDAGES/DRESSINGS) ×2 IMPLANT
GLOVE BIOGEL PI IND STRL 7.0 (GLOVE) ×2 IMPLANT
GLOVE BIOGEL PI INDICATOR 7.0 (GLOVE) ×2
GLOVE SKINSENSE NS SZ8.0 LF (GLOVE) ×1
GLOVE SKINSENSE STRL SZ8.0 LF (GLOVE) ×1 IMPLANT
GLOVE SS N UNI LF 8.5 STRL (GLOVE) ×2 IMPLANT
GOWN STRL REUS W/ TWL LRG LVL3 (GOWN DISPOSABLE) ×1 IMPLANT
GOWN STRL REUS W/TWL LRG LVL3 (GOWN DISPOSABLE) ×1
GOWN STRL REUS W/TWL XL LVL3 (GOWN DISPOSABLE) ×2 IMPLANT
HAND ALUMI XLG (SOFTGOODS) ×2 IMPLANT
KIT ROOM TURNOVER APOR (KITS) ×2 IMPLANT
MANIFOLD NEPTUNE II (INSTRUMENTS) ×2 IMPLANT
NEEDLE HYPO 21X1.5 SAFETY (NEEDLE) ×2 IMPLANT
NS IRRIG 1000ML POUR BTL (IV SOLUTION) ×2 IMPLANT
PACK BASIC LIMB (CUSTOM PROCEDURE TRAY) ×2 IMPLANT
PAD ARMBOARD 7.5X6 YLW CONV (MISCELLANEOUS) ×2 IMPLANT
SET BASIN LINEN APH (SET/KITS/TRAYS/PACK) ×2 IMPLANT
SUT ETHILON 3 0 FSL (SUTURE) ×2 IMPLANT
SYR CONTROL 10ML LL (SYRINGE) ×2 IMPLANT

## 2016-03-17 NOTE — Anesthesia Procedure Notes (Signed)
Anesthesia Regional Block:  Bier block (IV Regional)  Pre-Anesthetic Checklist: ,, timeout performed, Correct Patient, Correct Site, Correct Laterality, Correct Procedure,, site marked, surgical consent,, at surgeon's request  Laterality: Left     Needles:  Injection technique: Single-shot  Needle Type: Other      Needle Gauge: 22 and 22 G    Additional Needles: Bier block (IV Regional)  Nerve Stimulator or Paresthesia:   Additional Responses:  Pulse checked post tourniquet inflation. IV NSL discontinued post injection. Narrative:  Start time: 03/17/2016 7:46 AM  Performed by: Personally

## 2016-03-17 NOTE — Brief Op Note (Signed)
03/17/2016  8:24 AM  PATIENT:  Gean Birchwoodosetta Green  70 y.o. female  PRE-OPERATIVE DIAGNOSIS:  left carpal tunnel syndrome  POST-OPERATIVE DIAGNOSIS:  left carpal tunnel syndrome  PROCEDURE:  Procedure(s): LEFT CARPAL TUNNEL RELEASE (Left)  SURGEON:  Surgeon(s) and Role:    * Vickki HearingStanley E Amerie Beaumont, MD - Primary  PHYSICIAN ASSISTANT:   ASSISTANTS: none   ANESTHESIA:   regional  EBL:  Total I/O In: 400 [I.V.:400] Out: 0   BLOOD ADMINISTERED:none  DRAINS: none   LOCAL MEDICATIONS USED:  MARCAINE     SPECIMEN:  No Specimen  DISPOSITION OF SPECIMEN:  N/A  COUNTS:  YES  TOURNIQUET:   Total Tourniquet Time Documented: Upper Arm (Left) - 37 minutes Total: Upper Arm (Left) - 37 minutes   DICTATION: .Reubin Milanragon Dictation  PLAN OF CARE: Discharge to home after PACU  PATIENT DISPOSITION:  PACU - hemodynamically stable.   Delay start of Pharmacological VTE agent (>24hrs) due to surgical blood loss or risk of bleeding: not applicable

## 2016-03-17 NOTE — Discharge Instructions (Signed)
Carpal Tunnel Release, Care After °Refer to this sheet in the next few weeks. These instructions provide you with information about caring for yourself after your procedure. Your health care provider may also give you more specific instructions. Your treatment has been planned according to current medical practices, but problems sometimes occur. Call your health care provider if you have any problems or questions after your procedure. °WHAT TO EXPECT AFTER THE PROCEDURE °After your procedure, it is typical to have the following: °· Pain. °· Numbness. °· Tingling. °· Swelling. °· Stiffness. °· Bruising. °HOME CARE INSTRUCTIONS °· Take medicines only as directed by your health care provider. °· There are many different ways to close and cover an incision, including stitches (sutures), skin glue, and adhesive strips. Follow your health care provider's instructions about: °¨ Incision care. °¨ Bandage (dressing) changes and removal. °¨ Incision closure removal. °· Wear a splint or a brace as directed by your surgeon. You may need to do this for 2-3 weeks. °· Keep your hand raised (elevated) above the level of your heart while you are resting. Move your fingers often. °· Avoid activities that cause hand pain. °· Ask your surgeon when you can start to do all of your usual activities again, such as: °¨ Driving. °¨ Returning to work. °¨ Bathing and swimming. °· Keep all follow-up visits as directed by your health care provider. This is important. You may need physical therapy for several months to speed healing and regain movement. °SEEK MEDICAL CARE IF: °· You have drainage, redness, swelling, or pain at your incision site. °· You have a fever. °· You have chills. °· Your pain medicine is not working. °· Your symptoms do not go away after 2 months. °· Your symptoms go away and then return. °SEEK IMMEDIATE MEDICAL CARE IF: °· You have pain or numbness that is getting worse. °· Your fingers change color. °· You are not able  to move your fingers. °  °This information is not intended to replace advice given to you by your health care provider. Make sure you discuss any questions you have with your health care provider. °  °Document Released: 04/08/2005 Document Revised: 10/10/2014 Document Reviewed: 05/07/2014 °Elsevier Interactive Patient Education ©2016 [employer] ° Anesthesia, Adult, Care After °Refer to this sheet in the next few weeks. These instructions provide you with information on caring for yourself after your procedure. Your health care provider may also give you more specific instructions. Your treatment has been planned according to current medical practices, but problems sometimes occur. Call your health care provider if you have any problems or questions after your procedure. °WHAT TO EXPECT AFTER THE PROCEDURE °After the procedure, it is typical to experience: °· Sleepiness. °· Nausea and vomiting. °HOME CARE INSTRUCTIONS °· For the first 24 hours after general anesthesia: °¨ Have a responsible person with you. °¨ Do not drive a car. If you are alone, do not take public transportation. °¨ Do not drink alcohol. °¨ Do not take medicine that has not been prescribed by your health care provider. °¨ Do not sign important papers or make important decisions. °¨ You may resume a normal diet and activities as directed by your health care provider. °· Change bandages (dressings) as directed. °· If you have questions or problems that seem related to general anesthesia, call the hospital and ask for the anesthetist or anesthesiologist on call. °SEEK MEDICAL CARE IF: °· You have nausea and vomiting that continue the day after anesthesia. °· You develop   a rash. °SEEK IMMEDIATE MEDICAL CARE IF:  °· You have difficulty breathing. °· You have chest pain. °· You have any allergic problems. °  °This information is not intended to replace advice given to you by your health care provider. Make sure you discuss any questions you have with  your health care provider. °  °Document Released: 12/26/2000 Document Revised: 10/10/2014 Document Reviewed: 01/18/2012 °Elsevier Interactive Patient Education ©2016 [employer] ° °

## 2016-03-17 NOTE — Interval H&P Note (Signed)
History and Physical Interval Note:  03/17/2016 7:19 AM  Ariel [name]  has presented today for surgery, with the diagnosis of left carpal tunnel syndrome  The various methods of treatment have been discussed with the patient and family. After consideration of risks, benefits and other options for treatment, the patient has consented to  Procedure(s): CARPAL TUNNEL RELEASE (Left) as a surgical intervention .  The patient's history has been reviewed, patient examined, no change in status, stable for surgery.  I have reviewed the patient's chart and labs.  Questions were answered to the patient's satisfaction.     Fuller CanadaStanley Harrison

## 2016-03-17 NOTE — Transfer of Care (Signed)
Immediate Anesthesia Transfer of Care Note  Patient: Ariel [name]  Procedure(s) Performed: Procedure(s): LEFT CARPAL TUNNEL RELEASE (Left)  Patient Location: PACU  Anesthesia Type:Bier block  Level of Consciousness: awake  Airway & Oxygen Therapy: Patient Spontanous Breathing and Patient connected to face mask oxygen  Post-op Assessment: Report given to RN  Post vital signs: Reviewed and stable  Last Vitals:  Filed Vitals:   03/17/16 0725 03/17/16 0730  BP: 118/76 110/70  Temp:    Resp: 17 23    Last Pain: There were no vitals filed for this visit.    Patients Stated Pain Goal: 6 (03/17/16 16100628)  Complications: No apparent anesthesia complications

## 2016-03-17 NOTE — Anesthesia Postprocedure Evaluation (Signed)
Anesthesia Post Note  Patient: Ariel [name]  Procedure(s) Performed: Procedure(s) (LRB): LEFT CARPAL TUNNEL RELEASE (Left)  Patient location during evaluation: Short Stay Anesthesia Type: Bier Block Level of consciousness: awake and alert and oriented Pain management: pain level controlled Vital Signs Assessment: post-procedure vital signs reviewed and stable Respiratory status: spontaneous breathing Cardiovascular status: blood pressure returned to baseline Postop Assessment: no signs of nausea or vomiting Anesthetic complications: no    Last Vitals:  Filed Vitals:   03/17/16 0856 03/17/16 0900  BP: 127/81 120/70  Pulse:  72  Temp:  36.7 C  Resp:  18    Last Pain: There were no vitals filed for this visit.               Illiana Losurdo

## 2016-03-17 NOTE — Anesthesia Preprocedure Evaluation (Signed)
Anesthesia Evaluation  Patient identified by MRN, date of birth, ID band Patient awake    Reviewed: Allergy & Precautions, NPO status , Patient's Chart, lab work & pertinent test results  Airway Mallampati: II  TM Distance: >3 FB     Dental  (+) Edentulous Upper, Edentulous Lower   Pulmonary neg pulmonary ROS,    breath sounds clear to auscultation       Cardiovascular hypertension, Pt. on medications  Rhythm:Regular Rate:Normal     Neuro/Psych    GI/Hepatic   Endo/Other    Renal/GU      Musculoskeletal   Abdominal   Peds  Hematology   Anesthesia Other Findings   Reproductive/Obstetrics                             Anesthesia Physical Anesthesia Plan  ASA: II  Anesthesia Plan: Bier Block   Post-op Pain Management:    Induction: Intravenous  Airway Management Planned: Nasal Cannula  Additional Equipment:   Intra-op Plan:   Post-operative Plan:   Informed Consent: I have reviewed the patients History and Physical, chart, labs and discussed the procedure including the risks, benefits and alternatives for the proposed anesthesia with the patient or authorized representative who has indicated his/her understanding and acceptance.     Plan Discussed with:   Anesthesia Plan Comments:         Anesthesia Quick Evaluation

## 2016-03-17 NOTE — Op Note (Signed)
Carpal tunnel release left wrist  Preop diagnosis carpal tunnel syndrome left wrist   postop diagnosis same  Procedure open carpal tunnel release left wrist Surgeon Romeo AppleHarrison  Anesthesia regional Bier block  Operative findings compression of the left median nerve without any anterior carpal tunnel masses   Indications failure of conservative treatment to relieve pain and paresthesias and numbness and tingling of the left hand  The patient was identified in the preop area we confirm the surgical site marked as left wrist. Chart update completed. Patient taken to surgery. She had 2 g of Ancef. After establishing a Bier block left her arm was prepped with ChloraPrep.  Timeout executed completed and confirmed site.  A straight incision was made over the left carpal tunnel in line with the radial border of the ring finger. Blunt dissection was carried out to find the distal aspect of the carpal tunnel. A blunted instrument was passed beneath the carpal tunnel. Sharp incision was then used to release the transverse carpal ligament. The contents of the carpal tunnel were inspected. The median nerve was compressed with slight discoloration.  The wound was irrigated and then closed with 3-0 nylon suture. We injected 10 mL of plain Marcaine on the radial side of the incision  A sterile bandage was applied and the tourniquet was released the color of the hand and capillary refill were normal  The patient was taken to the recovery room in stable condition

## 2016-03-17 NOTE — H&P (Signed)
Chief Complaint   Patient presents with   .  preop       Discuss left carpal tunnel    Pain and paresthesias left hand  HPI Ariel [name] is a 70 y.o. female.   She [place] on the right side.  She has done well from that.  She was told she would need release on the left but put it off.  She has moved here now. She has been followed by Dr. Gerilyn Pilgrimoonquah for carpal tunnel left.   He has injected the left wrist area several times.  She says she is not much better.  He has referred her here for possible surgery.  I have reviewed his notes.  She has more nocturnal pain but the pain during the day is now present.  She drops things.  Nothing seems to help now.  She c/o pain over the left carpal tunnel with numbness and tingling of the left hand all fingers and pain over the fcr and fcu  Review of Systems Review of Systems  Constitutional: Negative for fever, chills and fatigue.  Musculoskeletal: Negative for gait problem.  Skin: Negative for color change.  Neurological: Positive for numbness.   Review of Systems  All other systems reviewed and are negative.      Past Medical History   Diagnosis  Date   .  Hypertension     .  Hyperlipidemia     .  Diverticulosis     .  Osteopenia         Past Surgical History   Procedure  Laterality  Date   .  Abdominal hysterectomy    1985   .  Carpal tunnel release  Right  2007       Family History   Problem  Relation  Age of Onset   .  Diabetes  Mother     .  Cancer  Father  1080       colon     Social History Social History   Substance Use Topics   .  Smoking status:  Never Smoker    .  Smokeless tobacco:  Not on file   .  Alcohol Use:  Yes         Comment: social     No Known Allergies    Current Outpatient Prescriptions   Medication  Sig  Dispense  Refill   .  simvastatin (ZOCOR) 40 MG tablet  Take 1 tablet (40 mg total) by mouth at bedtime.  90 tablet  1   .   valsartan-hydrochlorothiazide (DIOVAN-HCT) 160-25 MG tablet  Take 1/2 daily  90 tablet  1   .  HYDROcodone-acetaminophen (NORCO/VICODIN) 5-325 MG tablet  Take 1 tablet by mouth every 4 (four) hours as needed for moderate pain (Must last 15 days.  Do not take and drive a car or use machinery.).  42 tablet  0   .  ibuprofen (ADVIL,MOTRIN) 800 MG tablet  TAKE 1 TABLET 3 TIMES A DAY AS NEEDED (Patient not taking: Reported on 03/10/2016)  90 tablet  1      Current Facility-Administered Medications   Medication  Dose  Route  Frequency  Provider  Last Rate  Last Dose   .  levalbuterol (XOPENEX) nebulizer solution 1.25 mg   1.25 mg  Nebulization  Once  Deatra CanterWilliam J Oxford, FNP     1.25 mg at 12/26/13 1445  Physical Exam Blood pressure 106/71, pulse 87, height  (1.6 m), weight 192 lb 3.2 oz (87.181 kg). Physical Exam The patient is well developed well nourished and well groomed.   Orientation to person place and time is normal   Mood is pleasant.  Ambulatory status normal, no limp, no cane   left Upper extremity examination reveals the following:  Inspection reveals no swelling. There is tenderness over the carpal tunnel; and flexor tendons   Range of motion of the wrist and elbow are normal  Motor exam shows mild weakness with grip strength.  Wrist joint is stable  Provocative tests for carpal tunnel Phalen's test  positive Carpal tunnel compression test positive  Pulses are normal in the radial and ulnar artery with a normal Allen's test.  Decreased sensation is noted in the median nerve distribution. Soft touch is normal.  Opposite extremity Normal rom, stability and strength   Data Reviewed  Assessment    left CTS    Plan     Surgical release left carpal tunnel   This procedure has been fully reviewed with the patient and written informed consent has been obtained.

## 2016-03-18 ENCOUNTER — Encounter (HOSPITAL_COMMUNITY): Payer: Self-pay | Admitting: Orthopedic Surgery

## 2016-03-23 ENCOUNTER — Ambulatory Visit: Payer: Medicare Other | Admitting: Orthopedic Surgery

## 2016-03-23 VITALS — BP 108/65 | HR 91 | Ht 63.0 in | Wt 194.0 lb

## 2016-03-23 DIAGNOSIS — Z9889 Other specified postprocedural states: Secondary | ICD-10-CM

## 2016-03-23 MED ORDER — TRAMADOL-ACETAMINOPHEN 37.5-325 MG PO TABS: 1.0000 | ORAL_TABLET | Freq: Four times a day (QID) | ORAL | Status: DC | PRN

## 2016-03-23 NOTE — Patient Instructions (Signed)
MOVE YOUR FINGERS AS TOLERATED

## 2016-03-23 NOTE — Progress Notes (Signed)
Ariel [name] is status post left carpal tunnel release   S:   Chief Complaint  Patient presents with  . Follow-up    Post op #1, left CTR, DOS 03-16-16.   C/O SEVERE PAIN  The patient complains of  SEVERE PAIN BUT SHOW NO SIGNS OF IT   Current pain medication: NORCO 5 MG   The incision is clean dry and intact with no drainage The dressing was changed.  Patient to return for suture removal ~ POD 12-14  START ULTRACET

## 2016-03-24 ENCOUNTER — Telehealth: Payer: Self-pay | Admitting: Orthopedic Surgery

## 2016-03-24 NOTE — Telephone Encounter (Signed)
Call received from patient relaying that the medication prescribed yesterday, 03/23/16, traMADol-acetaminophen (ULTRACET) 37.5-325 MG tablet [[id number]][[id number]] ([facility]) , of which she took 1 dose last evening, is not working.  She is asking about the Hydrocodone prescribed at time of surgery.  Please advise.  Patient's ph# is [phone]

## 2016-03-24 NOTE — Telephone Encounter (Signed)
Routing to Dr Harrison for review 

## 2016-03-29 ENCOUNTER — Encounter: Payer: Self-pay | Admitting: Orthopedic Surgery

## 2016-03-29 ENCOUNTER — Ambulatory Visit (INDEPENDENT_AMBULATORY_CARE_PROVIDER_SITE_OTHER): Payer: Medicare Other | Admitting: Orthopedic Surgery

## 2016-03-29 VITALS — BP 116/77 | HR 76 | Ht 64.0 in | Wt 192.5 lb

## 2016-03-29 DIAGNOSIS — Z9889 Other specified postprocedural states: Secondary | ICD-10-CM

## 2016-03-29 DIAGNOSIS — Z4789 Encounter for other orthopedic aftercare: Secondary | ICD-10-CM

## 2016-03-29 MED ORDER — ACETAMINOPHEN-CODEINE #4 300-60 MG PO TABS: 1.0000 | ORAL_TABLET | Freq: Four times a day (QID) | ORAL | Status: DC | PRN

## 2016-03-29 NOTE — Progress Notes (Signed)
  Left carpal tunnel release 03/16/2016  Postop day 13  Patient complains of pain over the palm preoperative numbness and tingling has resolved  Stitches were taken out she is switched from Ultracet to Tylenol No. 4 come back July 12

## 2016-04-19 ENCOUNTER — Ambulatory Visit (INDEPENDENT_AMBULATORY_CARE_PROVIDER_SITE_OTHER): Payer: Medicare Other | Admitting: Orthopedic Surgery

## 2016-04-19 ENCOUNTER — Encounter: Payer: Self-pay | Admitting: Orthopedic Surgery

## 2016-04-19 VITALS — BP 97/56 | Ht 64.0 in | Wt 191.0 lb

## 2016-04-19 DIAGNOSIS — Z4789 Encounter for other orthopedic aftercare: Secondary | ICD-10-CM

## 2016-04-19 DIAGNOSIS — Z9889 Other specified postprocedural states: Secondary | ICD-10-CM

## 2016-04-19 MED ORDER — ACETAMINOPHEN-CODEINE #4 300-60 MG PO TABS: 1.0000 | ORAL_TABLET | Freq: Four times a day (QID) | ORAL | Status: DC | PRN

## 2016-04-19 NOTE — Progress Notes (Signed)
Chief Complaint  Patient presents with  . Follow-up    post op left carpal tunnel release, DOS 03/17/16    BP 97/56 mmHg  Ht 5\' 4"  (1.626 m)  Wt 191 lb (86.637 kg)  BMI 32.77 kg/m2  C/o pain at the incision  shes not having any neurologic symptoms and the pain is controlled with tyl # 4   The incision has some epidermal dryness and separation but dermis is closed   F/u 2 months  Continue tyl 4

## 2016-05-18 ENCOUNTER — Encounter: Payer: Self-pay | Admitting: Nurse Practitioner

## 2016-05-18 ENCOUNTER — Ambulatory Visit (INDEPENDENT_AMBULATORY_CARE_PROVIDER_SITE_OTHER): Payer: Medicare Other | Admitting: Nurse Practitioner

## 2016-05-18 VITALS — BP 115/80 | HR 80 | Temp 97.1°F | Ht 64.0 in | Wt 194.0 lb

## 2016-05-18 DIAGNOSIS — I10 Essential (primary) hypertension: Secondary | ICD-10-CM | POA: Diagnosis not present

## 2016-05-18 DIAGNOSIS — E785 Hyperlipidemia, unspecified: Secondary | ICD-10-CM | POA: Diagnosis not present

## 2016-05-18 DIAGNOSIS — Z1212 Encounter for screening for malignant neoplasm of rectum: Secondary | ICD-10-CM | POA: Diagnosis not present

## 2016-05-18 DIAGNOSIS — R739 Hyperglycemia, unspecified: Secondary | ICD-10-CM

## 2016-05-18 DIAGNOSIS — Z23 Encounter for immunization: Secondary | ICD-10-CM

## 2016-05-18 DIAGNOSIS — E8881 Metabolic syndrome: Secondary | ICD-10-CM

## 2016-05-18 DIAGNOSIS — E669 Obesity, unspecified: Secondary | ICD-10-CM | POA: Diagnosis not present

## 2016-05-18 MED ORDER — SIMVASTATIN 40 MG PO TABS: 40.0000 mg | ORAL_TABLET | Freq: Every day | ORAL | 1 refills | Status: DC

## 2016-05-18 MED ORDER — VALSARTAN-HYDROCHLOROTHIAZIDE 160-25 MG PO TABS: 0.5000 | ORAL_TABLET | Freq: Every day | ORAL | 1 refills | Status: DC

## 2016-05-18 NOTE — Patient Instructions (Signed)
Health Maintenance, Female Adopting a healthy lifestyle and getting preventive care can go a long way to promote health and wellness. Talk with your health care provider about what schedule of regular examinations is right for you. This is a good chance for you to check in with your provider about disease prevention and staying healthy. In between checkups, there are plenty of things you can do on your own. Experts have done a lot of research about which lifestyle changes and preventive measures are most likely to keep you healthy. Ask your health care provider for more information. WEIGHT AND DIET  Eat a healthy diet  Be sure to include plenty of vegetables, fruits, low-fat dairy products, and lean protein.  Do not eat a lot of foods high in solid fats, added sugars, or salt.  Get regular exercise. This is one of the most important things you can do for your health.  Most adults should exercise for at least 150 minutes each week. The exercise should increase your heart rate and make you sweat (moderate-intensity exercise).  Most adults should also do strengthening exercises at least twice a week. This is in addition to the moderate-intensity exercise.  Maintain a healthy weight  Body mass index (BMI) is a measurement that can be used to identify possible weight problems. It estimates body fat based on height and weight. Your health care provider can help determine your BMI and help you achieve or maintain a healthy weight.  For females 20 years of age and older:   A BMI below 18.5 is considered underweight.  A BMI of 18.5 to 24.9 is normal.  A BMI of 25 to 29.9 is considered overweight.  A BMI of 30 and above is considered obese.  Watch levels of cholesterol and blood lipids  You should start having your blood tested for lipids and cholesterol at 69 years of age, then have this test every 5 years.  You may need to have your cholesterol levels checked more often if:  Your lipid  or cholesterol levels are high.  You are older than 69 years of age.  You are at high risk for heart disease.  CANCER SCREENING   Lung Cancer  Lung cancer screening is recommended for adults 55-80 years old who are at high risk for lung cancer because of a history of smoking.  A yearly low-dose CT scan of the lungs is recommended for people who:  Currently smoke.  Have quit within the past 15 years.  Have at least a 30-pack-year history of smoking. A pack year is smoking an average of one pack of cigarettes a day for 1 year.  Yearly screening should continue until it has been 15 years since you quit.  Yearly screening should stop if you develop a health problem that would prevent you from having lung cancer treatment.  Breast Cancer  Practice breast self-awareness. This means understanding how your breasts normally appear and feel.  It also means doing regular breast self-exams. Let your health care provider know about any changes, no matter how small.  If you are in your 20s or 30s, you should have a clinical breast exam (CBE) by a health care provider every 1-3 years as part of a regular health exam.  If you are 40 or older, have a CBE every year. Also consider having a breast X-ray (mammogram) every year.  If you have a family history of breast cancer, talk to your health care provider about genetic screening.  If you   are at high risk for breast cancer, talk to your health care provider about having an MRI and a mammogram every year.  Breast cancer gene (BRCA) assessment is recommended for women who have family members with BRCA-related cancers. BRCA-related cancers include:  Breast.  Ovarian.  Tubal.  Peritoneal cancers.  Results of the assessment will determine the need for genetic counseling and BRCA1 and BRCA2 testing. Cervical Cancer Your health care provider may recommend that you be screened regularly for cancer of the pelvic organs (ovaries, uterus, and  vagina). This screening involves a pelvic examination, including checking for microscopic changes to the surface of your cervix (Pap test). You may be encouraged to have this screening done every 3 years, beginning at age 21.  For women ages 30-65, health care providers may recommend pelvic exams and Pap testing every 3 years, or they may recommend the Pap and pelvic exam, combined with testing for human papilloma virus (HPV), every 5 years. Some types of HPV increase your risk of cervical cancer. Testing for HPV may also be done on women of any age with unclear Pap test results.  Other health care providers may not recommend any screening for nonpregnant women who are considered low risk for pelvic cancer and who do not have symptoms. Ask your health care provider if a screening pelvic exam is right for you.  If you have had past treatment for cervical cancer or a condition that could lead to cancer, you need Pap tests and screening for cancer for at least 20 years after your treatment. If Pap tests have been discontinued, your risk factors (such as having a new sexual partner) need to be reassessed to determine if screening should resume. Some women have medical problems that increase the chance of getting cervical cancer. In these cases, your health care provider may recommend more frequent screening and Pap tests. Colorectal Cancer  This type of cancer can be detected and often prevented.  Routine colorectal cancer screening usually begins at 69 years of age and continues through 69 years of age.  Your health care provider may recommend screening at an earlier age if you have risk factors for colon cancer.  Your health care provider may also recommend using home test kits to check for hidden blood in the stool.  A small camera at the end of a tube can be used to examine your colon directly (sigmoidoscopy or colonoscopy). This is done to check for the earliest forms of colorectal  cancer.  Routine screening usually begins at age 50.  Direct examination of the colon should be repeated every 5-10 years through 69 years of age. However, you may need to be screened more often if early forms of precancerous polyps or small growths are found. Skin Cancer  Check your skin from head to toe regularly.  Tell your health care provider about any new moles or changes in moles, especially if there is a change in a mole's shape or color.  Also tell your health care provider if you have a mole that is larger than the size of a pencil eraser.  Always use sunscreen. Apply sunscreen liberally and repeatedly throughout the day.  Protect yourself by wearing long sleeves, pants, a wide-brimmed hat, and sunglasses whenever you are outside. HEART DISEASE, DIABETES, AND HIGH BLOOD PRESSURE   High blood pressure causes heart disease and increases the risk of stroke. High blood pressure is more likely to develop in:  People who have blood pressure in the high end   of the normal range (130-139/85-89 mm Hg).  People who are overweight or obese.  People who are African American.  If you are 38-23 years of age, have your blood pressure checked every 3-5 years. If you are 61 years of age or older, have your blood pressure checked every year. You should have your blood pressure measured twice--once when you are at a hospital or clinic, and once when you are not at a hospital or clinic. Record the average of the two measurements. To check your blood pressure when you are not at a hospital or clinic, you can use:  An automated blood pressure machine at a pharmacy.  A home blood pressure monitor.  If you are between 45 years and 39 years old, ask your health care provider if you should take aspirin to prevent strokes.  Have regular diabetes screenings. This involves taking a blood sample to check your fasting blood sugar level.  If you are at a normal weight and have a low risk for diabetes,  have this test once every three years after 68 years of age.  If you are overweight and have a high risk for diabetes, consider being tested at a younger age or more often. PREVENTING INFECTION  Hepatitis B  If you have a higher risk for hepatitis B, you should be screened for this virus. You are considered at high risk for hepatitis B if:  You were born in a country where hepatitis B is common. Ask your health care provider which countries are considered high risk.  Your parents were born in a high-risk country, and you have not been immunized against hepatitis B (hepatitis B vaccine).  You have HIV or AIDS.  You use needles to inject street drugs.  You live with someone who has hepatitis B.  You have had sex with someone who has hepatitis B.  You get hemodialysis treatment.  You take certain medicines for conditions, including cancer, organ transplantation, and autoimmune conditions. Hepatitis C  Blood testing is recommended for:  Everyone born from [birth year] through 1964.  Anyone with known risk factors for hepatitis C. Sexually transmitted infections (STIs)  You should be screened for sexually transmitted infections (STIs) including gonorrhea and chlamydia if:  You are sexually active and are younger than 69 years of age.  You are older than 69 years of age and your health care provider tells you that you are at risk for this type of infection.  Your sexual activity has changed since you were last screened and you are at an increased risk for chlamydia or gonorrhea. Ask your health care provider if you are at risk.  If you do not have HIV, but are at risk, it may be recommended that you take a prescription medicine daily to prevent HIV infection. This is called pre-exposure prophylaxis (PrEP). You are considered at risk if:  You are sexually active and do not regularly use condoms or know the HIV status of your partner(s).  You take drugs by injection.  You are sexually  active with a partner who has HIV. Talk with your health care provider about whether you are at high risk of being infected with HIV. If you choose to begin PrEP, you should first be tested for HIV. You should then be tested every 3 months for as long as you are taking PrEP.  PREGNANCY   If you are premenopausal and you may become pregnant, ask your health care provider about preconception counseling.  If you may  become pregnant, take 400 to 800 micrograms (mcg) of folic acid every day.  If you want to prevent pregnancy, talk to your health care provider about birth control (contraception). OSTEOPOROSIS AND MENOPAUSE   Osteoporosis is a disease in which the bones lose minerals and strength with aging. This can result in serious bone fractures. Your risk for osteoporosis can be identified using a bone density scan.  If you are 61 years of age or older, or if you are at risk for osteoporosis and fractures, ask your health care provider if you should be screened.  Ask your health care provider whether you should take a calcium or vitamin D supplement to lower your risk for osteoporosis.  Menopause may have certain physical symptoms and risks.  Hormone replacement therapy may reduce some of these symptoms and risks. Talk to your health care provider about whether hormone replacement therapy is right for you.  HOME CARE INSTRUCTIONS   Schedule regular health, dental, and eye exams.  Stay current with your immunizations.   Do not use any tobacco products including cigarettes, chewing tobacco, or electronic cigarettes.  If you are pregnant, do not drink alcohol.  If you are breastfeeding, limit how much and how often you drink alcohol.  Limit alcohol intake to no more than 1 drink per day for nonpregnant women. One drink equals 12 ounces of beer, 5 ounces of wine, or 1 ounces of hard liquor.  Do not use street drugs.  Do not share needles.  Ask your health care provider for help if  you need support or information about quitting drugs.  Tell your health care provider if you often feel depressed.  Tell your health care provider if you have ever been abused or do not feel safe at home.   This information is not intended to replace advice given to you by your health care provider. Make sure you discuss any questions you have with your health care provider.   Document Released: 04/04/2011 Document Revised: 10/10/2014 Document Reviewed: 08/21/2013 Elsevier Interactive Patient Education [employer].

## 2016-05-18 NOTE — Progress Notes (Signed)
Subjective:    Patient ID: Ariel [name], female    DOB: 1946/02/28, 70 y.o.   MRN: [id number]  Patient here today for follow up of chronic medical problems.  Outpatient Encounter Prescriptions as of 05/18/2016  Medication Sig  . acetaminophen-codeine (TYLENOL #4) 300-60 MG tablet Take 1-2 tablets by mouth every 6 (six) hours as needed for moderate pain.  . simvastatin (ZOCOR) 40 MG tablet Take 1 tablet (40 mg total) by mouth at bedtime.  . valsartan-hydrochlorothiazide (DIOVAN-HCT) 160-25 MG tablet Take 0.5 tablets by mouth daily.   * had left carpal tunnel surgery on March 17, 2016- doing well has follow up with surgeon next month.  Hypertension  This is a chronic problem. The current episode started more than 1 year ago. The problem is unchanged. The problem is controlled. Pertinent negatives include no chest pain, headaches, orthopnea, palpitations or shortness of breath. Risk factors for coronary artery disease include dyslipidemia, obesity, post-menopausal state and sedentary lifestyle. Past treatments include ACE inhibitors and diuretics. The current treatment provides moderate improvement. Compliance problems include diet and exercise.  There is no history of CAD/MI or CVA.  Hyperlipidemia  This is a chronic problem. The current episode started more than 1 year ago. The problem is uncontrolled. Recent lipid tests were reviewed and are normal. Exacerbating diseases include obesity. She has no history of diabetes or hypothyroidism. Pertinent negatives include no chest pain or shortness of breath. Current antihyperlipidemic treatment includes statins. The current treatment provides moderate improvement of lipids. Compliance problems include adherence to diet and adherence to exercise.  Risk factors for coronary artery disease include dyslipidemia, hypertension, post-menopausal and obesity.  metabolic syndrome Trying to watch diet- has nit had hgba1c drawn- does not check blood sugars at  home.   Review of Systems  Constitutional: Negative.   HENT: Negative.   Respiratory: Negative for shortness of breath.   Cardiovascular: Negative for chest pain, palpitations and orthopnea.  Neurological: Negative.  Negative for headaches.  Psychiatric/Behavioral: Negative.   All other systems reviewed and are negative.      Objective:   Physical Exam  Constitutional: She is oriented to person, place, and time. She appears well-developed and well-nourished.  HENT:  Nose: Nose normal.  Mouth/Throat: Oropharynx is clear and moist.  Eyes: EOM are normal.  Neck: Trachea normal, normal range of motion and full passive range of motion without pain. Neck supple. No JVD present. Carotid bruit is not present. No thyromegaly present.  Cardiovascular: Normal rate, regular rhythm, normal heart sounds and intact distal pulses.  Exam reveals no gallop and no friction rub.   No murmur heard. Pulmonary/Chest: Effort normal and breath sounds normal.  Abdominal: Soft. Bowel sounds are normal. She exhibits no distension and no mass. There is no tenderness.  Musculoskeletal: Normal range of motion.  Lymphadenopathy:    She has no cervical adenopathy.  Neurological: She is alert and oriented to person, place, and time. She has normal reflexes.  Skin: Skin is warm and dry.  Psychiatric: She has a normal mood and affect. Her behavior is normal. Judgment and thought content normal.    BP 115/80   Pulse 80   Temp 97.1 F (36.2 C) (Oral)   Ht _0  (1.626 m)   Wt 194 lb (88 kg)   BMI 33.30 kg/m         Assessment & Plan:  1. Essential hypertension Do not add salt to diet - valsartan-hydrochlorothiazide (DIOVAN-HCT) 160-25 MG tablet; Take 0.5 tablets by mouth  daily.  Dispense: 90 tablet; Refill: 1 - CMP14+EGFR  2. HLD (hyperlipidemia) Low fat diet - simvastatin (ZOCOR) 40 MG tablet; Take 1 tablet (40 mg total) by mouth at bedtime.  Dispense: 90 tablet; Refill: 1 - Lipid panel  3.  Metabolic syndrome Watch carbs in diet  4. Obesity Discussed diet and exercise for person with BMI >25 Will recheck weight in 3-6 months  5. Screening for malignant neoplasm of the rectum - Fecal occult blood, imunochemical; Future    Labs pending Health maintenance reviewed Diet and exercise encouraged Continue all meds Follow up  In 6 month   Gibson, FNP

## 2016-05-18 NOTE — Addendum Note (Signed)
Addended by: Cleda DaubUCKER, AMANDA G on: 05/18/2016 09:13 AM   Modules accepted: Orders

## 2016-05-19 LAB — CMP14+EGFR
A/G RATIO: 1.5 (ref 1.2–2.2)
ALT: 16 IU/L (ref 0–32)
AST: 20 IU/L (ref 0–40)
Albumin: 4.1 g/dL (ref 3.6–4.8)
Alkaline Phosphatase: 80 IU/L (ref 39–117)
BUN/Creatinine Ratio: 17 (ref 12–28)
BUN: 12 mg/dL (ref 8–27)
Bilirubin Total: 0.3 mg/dL (ref 0.0–1.2)
CALCIUM: 9.8 mg/dL (ref 8.7–10.3)
CO2: 22 mmol/L (ref 18–29)
Chloride: 103 mmol/L (ref 96–106)
Creatinine, Ser: 0.72 mg/dL (ref 0.57–1.00)
GFR, EST AFRICAN AMERICAN: 99 mL/min/{1.73_m2} (ref >59)
GFR, EST NON AFRICAN AMERICAN: 86 mL/min/{1.73_m2} (ref >59)
GLOBULIN, TOTAL: 2.8 g/dL (ref 1.5–4.5)
Glucose: 116 mg/dL — ABNORMAL HIGH (ref 65–99)
POTASSIUM: 4.4 mmol/L (ref 3.5–5.2)
Sodium: 143 mmol/L (ref 134–144)
TOTAL PROTEIN: 6.9 g/dL (ref 6.0–8.5)

## 2016-05-19 LAB — LIPID PANEL
CHOL/HDL RATIO: 3.3 ratio (ref 0.0–4.4)
Cholesterol, Total: 164 mg/dL (ref 100–199)
HDL: 50 mg/dL (ref >39)
LDL Calculated: 92 mg/dL (ref 0–99)
TRIGLYCERIDES: 111 mg/dL (ref 0–149)
VLDL CHOLESTEROL CAL: 22 mg/dL (ref 5–40)

## 2016-05-24 DIAGNOSIS — R7309 Other abnormal glucose: Secondary | ICD-10-CM | POA: Diagnosis not present

## 2016-05-24 LAB — BAYER DCA HB A1C WAIVED: HB A1C (BAYER DCA - WAIVED): 6 % (ref <7.0)

## 2016-05-24 NOTE — Addendum Note (Signed)
Addended by: Cleda DaubUCKER, AMANDA G on: 05/24/2016 02:35 PM   Modules accepted: Orders

## 2016-05-25 ENCOUNTER — Encounter: Payer: Medicare Other | Admitting: *Deleted

## 2016-05-25 DIAGNOSIS — Z1231 Encounter for screening mammogram for malignant neoplasm of breast: Secondary | ICD-10-CM | POA: Diagnosis not present

## 2016-05-31 ENCOUNTER — Ambulatory Visit (INDEPENDENT_AMBULATORY_CARE_PROVIDER_SITE_OTHER): Payer: Medicare Other | Admitting: Nurse Practitioner

## 2016-05-31 VITALS — BP 116/75 | HR 93 | Temp 96.7°F | Ht 64.0 in | Wt 192.0 lb

## 2016-05-31 DIAGNOSIS — J019 Acute sinusitis, unspecified: Secondary | ICD-10-CM

## 2016-05-31 DIAGNOSIS — B9689 Other specified bacterial agents as the cause of diseases classified elsewhere: Secondary | ICD-10-CM

## 2016-05-31 MED ORDER — AZITHROMYCIN 250 MG PO TABS: ORAL_TABLET | ORAL | 0 refills | Status: DC

## 2016-05-31 NOTE — Patient Instructions (Signed)

## 2016-05-31 NOTE — Progress Notes (Signed)
Subjective:     Ariel BirchwoodRosetta [name] is a 70 y.o. female who presents for evaluation of sinus pain. Symptoms include: clear rhinorrhea. Onset of symptoms was 5 days ago. Symptoms have been gradually worsening since that time. Past history is significant for no history of pneumonia or bronchitis. Patient is a non-smoker.  The following portions of the patient's history were reviewed and updated as appropriate: past family history, past surgical history and problem list.  Review of Systems Pertinent items are noted in HPI.   Objective:    BP 116/75   Pulse 93   Temp (!) 96.7 F (35.9 C) (Oral)   Ht 5\' 4"  (1.626 m)   Wt 192 lb (87.1 kg)   BMI 32.96 kg/m  General appearance: alert and cooperative Eyes: conjunctivae/corneas clear. PERRL, EOM's intact. Fundi benign. Ears: normal TM's and external ear canals both ears Nose: clear and purulent discharge, moderate congestion, turbinates red, swollen, no sinus tenderness Throat: lips, mucosa, and tongue normal; teeth and gums normal Lungs: clear to auscultation bilaterally and dry cough Heart: regular rate and rhythm, S1, S2 normal, no murmur, click, rub or gallop    Assessment:    Acute bacterial sinusitis.    Plan:     1. Take meds as prescribed 2. Use a cool mist humidifier especially during the winter months and when heat has been humid. 3. Use saline nose sprays frequently 4. Saline irrigations of the nose can be very helpful if done frequently.  * 4X daily for 1 week*  * Use of a nettie pot can be helpful with this. Follow directions with this* 5. Drink plenty of fluids 6. Keep thermostat turn down low 7.For any cough or congestion  Use plain Mucinex- regular strength or max strength is fine   * Children- consult with Pharmacist for dosing 8. For fever or aces or pains- take tylenol or ibuprofen appropriate for age and weight.  * for fevers greater than 101 orally you may alternate ibuprofen and tylenol every  3 hours.

## 2016-06-21 ENCOUNTER — Ambulatory Visit: Payer: Medicare Other | Admitting: Orthopedic Surgery

## 2016-07-08 ENCOUNTER — Ambulatory Visit: Payer: Medicare Other | Admitting: Orthopedic Surgery

## 2016-07-11 ENCOUNTER — Ambulatory Visit: Payer: Medicare Other | Admitting: Orthopedic Surgery

## 2016-07-15 ENCOUNTER — Ambulatory Visit (INDEPENDENT_AMBULATORY_CARE_PROVIDER_SITE_OTHER): Payer: Medicare Other | Admitting: Orthopedic Surgery

## 2016-07-15 ENCOUNTER — Encounter: Payer: Self-pay | Admitting: Orthopedic Surgery

## 2016-07-15 DIAGNOSIS — M75102 Unspecified rotator cuff tear or rupture of left shoulder, not specified as traumatic: Secondary | ICD-10-CM

## 2016-07-15 DIAGNOSIS — Z9889 Other specified postprocedural states: Secondary | ICD-10-CM

## 2016-07-15 NOTE — Progress Notes (Signed)
Patient ID: Ariel [name], female   DOB: 09/02/1946, 70 y.o.   MRN: [id number]  Chief Complaint  Patient presents with  . Follow-up    LEFT CARPAL TUNNEL RELEASE 03/17/16    HPI Ariel [name] is a 70 y.o. female.   HPI  70 year old female had a carpal tunnel release doing well has some mild soreness over the incision at times no night pain no neurologic symptoms  New complaint of left shoulder pain with a history of previous injection and PT many years ago. Comes in complaining of every month history of pain over the left shoulder pain with forward elevation which is described as a mild to moderate dull ache is worse with overhead activity  Review of Systems Review of Systems  Constitutional: Negative for chills.  Musculoskeletal: Negative for neck pain.  Skin: Negative for color change.  Neurological: Negative for numbness.   Past Medical History:  Diagnosis Date  . Arthritis   . Diverticulosis   . Hyperlipidemia   . Hypertension   . Osteopenia      Physical Exam Appearance is normal grooming hygiene normal. Oriented 3 normal. Mood and affect normal Gait and station normal  Physical Exam Right shoulder strength supraspinatus grade 5. Full range of motion. Stability tests normal. Skin normal.  Left shoulder mild tenderness rotator interval. Painful range of motion and 120 of flexion. Stability tests were normal. Strength tests were noted for weakness in the rotator cuff skin was normal pulses were normal lymph nodes were normal and sensation was normal impingement sign was positive   Diagnosis #1 resolve carpal tunnel syndrome #2 rotator cuff syndrome left shoulder   Plan the patient did not want injection today [facility] [place] [facility] so I gave her a home exercise program advised to take Aleve and Advil depending on which one she does better with and take her Tylenol with codeine as needed and call us [facility] of the left  shoulder

## 2016-07-15 NOTE — Patient Instructions (Signed)
HOME EXERCISES   ADVIL FOR SHOULDER PAIN OR ALEVE  TYLENOL CODEINE AS CALL US NEEDED   CALLFOR APPT AFTER [facility] TRIP

## 2016-09-06 ENCOUNTER — Ambulatory Visit: Payer: Medicare Other

## 2016-09-14 ENCOUNTER — Ambulatory Visit: Payer: Medicare Other

## 2016-10-07 ENCOUNTER — Ambulatory Visit: Payer: Medicare Other

## 2016-10-10 ENCOUNTER — Encounter: Payer: Self-pay | Admitting: Nurse Practitioner

## 2016-10-13 ENCOUNTER — Ambulatory Visit (INDEPENDENT_AMBULATORY_CARE_PROVIDER_SITE_OTHER): Payer: Medicare Other | Admitting: *Deleted

## 2016-10-13 ENCOUNTER — Encounter (INDEPENDENT_AMBULATORY_CARE_PROVIDER_SITE_OTHER): Payer: Self-pay

## 2016-10-13 VITALS — BP 104/74 | HR 82 | Ht 64.0 in | Wt 194.0 lb

## 2016-10-13 DIAGNOSIS — Z135 Encounter for screening for eye and ear disorders: Secondary | ICD-10-CM

## 2016-10-13 DIAGNOSIS — Z0000 Encounter for general adult medical examination without abnormal findings: Secondary | ICD-10-CM

## 2016-10-13 DIAGNOSIS — H9193 Unspecified hearing loss, bilateral: Secondary | ICD-10-CM

## 2016-10-13 DIAGNOSIS — Z23 Encounter for immunization: Secondary | ICD-10-CM

## 2016-10-13 NOTE — Progress Notes (Signed)
Subjective:   Ariel [name] is a 71 y.o. female who presents for an Initial Medicare Annual Wellness Visit. Ms Chilton [name] lives at home with her fiancee. They moved to the area from [place] about 10 years ago. She enjoys doing puzzle books and walks when the weather is nice. She is a retired and did various jobs while employed. She was a Conservation officer, nature, a Diplomatic Services operational officer, and a bus driver at different times. She has one adult daughter, one adult grandaughter, and one Writer.   Review of Systems    Ms Chilton [name] reports that her health is about the same as last year.   Cardiac Risk Factors include: hypertension;dyslipidemia;advanced age (>54men, >16 women);obesity (BMI >30kg/m2);sedentary lifestyle   Musculoskeletal: Some left shoulder pain and decreased ROM. Sees ortho and they are aware.  Urinary: Chronic nocturia as well as daytime frequency.   Other systems negative.     Objective:    BP 104/74 (BP Location: Right Arm, Patient Position: Sitting, Cuff Size: Normal)   Pulse 82    Current Medications (verified) Outpatient Encounter Prescriptions as of 10/13/2016  Medication Sig  . acetaminophen-codeine (TYLENOL #4) 300-60 MG tablet Take 1-2 tablets by mouth every 6 (six) hours as needed for moderate pain.  . simvastatin (ZOCOR) 40 MG tablet Take 1 tablet (40 mg total) by mouth at bedtime.  . valsartan-hydrochlorothiazide (DIOVAN-HCT) 160-25 MG tablet Take 0.5 tablets by mouth daily.   No facility-administered encounter medications on file as of 10/13/2016.     Allergies (verified) Patient has no known allergies.   History: Past Medical History:  Diagnosis Date  . Arthritis   . Diverticulosis   . Hyperlipidemia   . Hypertension   . Osteopenia    Past Surgical History:  Procedure Laterality Date  . ABDOMINAL HYSTERECTOMY  1985  . CARPAL TUNNEL RELEASE Right 2007  . CARPAL TUNNEL RELEASE Left 03/17/2016   Procedure: LEFT CARPAL TUNNEL RELEASE;  Surgeon: Vickki Hearing, MD;   Location: [facility];  Service: Orthopedics;  Laterality: Left;   Family History  Problem Relation Age of Onset  . Diabetes Mother   . Cancer Father 42    colon  . Alcohol abuse Brother   . Cancer Brother     stomach  . Lupus Brother    Social History   Occupational History  . Not on file.   Social History Main Topics  . Smoking status: Never Smoker  . Smokeless tobacco: Never Used  . Alcohol use Yes     Comment: social  . Drug use: No  . Sexual activity: Yes    Birth control/ protection: Surgical    Tobacco Counseling No tobacco use  Activities of Daily Living In your present state of health, do you have any difficulty performing the following activities: 10/13/2016 03/15/2016  Hearing? Malvin Johns  Vision? N N  Difficulty concentrating or making decisions? N N  Walking or climbing stairs? N N  Dressing or bathing? N N  Doing errands, shopping? N N  Preparing Food and eating ? N -  Using the Toilet? N -  In the past six months, have you accidently leaked urine? N -  Do you have problems with loss of bowel control? N -  Managing your Medications? N -  Managing your Finances? N -  Housekeeping or managing your Housekeeping? N -  Some recent data might be hidden   Reports some hearing loss and desire to see an audiologist.   Immunizations and Health Maintenance Immunization  History  Administered Date(s) Administered  . Influenza Whole 08/03/2012  . Pneumococcal Conjugate-13 12/08/2014  . Pneumococcal Polysaccharide-23 05/18/2016  . Tdap 10/13/2016  . Zoster 04/29/2014   There are no preventive care reminders to display for this patient.  Patient Care Team: Bennie PieriniMary-Margaret Martin, FNP as PCP - General (Nurse Practitioner) Vickki HearingStanley E Harrison, MD as Consulting Physician (Orthopedic Surgery)      Assessment:   This is a routine wellness examination for Ariel [name].  Hearing/Vision screen No deficits noted during visits. Patient has never had an eye exam.   Dietary  issues and exercise activities discussed: Current Exercise Habits: Home exercise routine, Type of exercise: walking, Time (Minutes): 30, Frequency (Times/Week): 3, Weekly Exercise (Minutes/Week): 90, Intensity: Moderate, Exercise limited by: None identified  Goals    . Exercise 3x per week (30 min per time)          Try to walk for 30 minutes 3 times per week      Eats a light breakfast and light lunch and a homecooked meal at supper.   Depression Screen PHQ 2/9 Scores 10/13/2016 05/31/2016 05/18/2016 09/02/2015 04/15/2015 12/08/2014 04/29/2014  PHQ - 2 Score 0 0 0 0 0 0 0    Fall Risk Fall Risk  10/13/2016 05/31/2016 05/18/2016 09/02/2015 04/15/2015  Falls in the past year? No No No No No    Cognitive Function: MMSE - Mini Mental State Exam 10/13/2016  Orientation to time 5  Orientation to Place 4  Registration 3  Attention/ Calculation 5  Recall 3  Language- name 2 objects 2  Language- repeat 1  Language- follow 3 step command 3  Language- read & follow direction 0  Write a sentence 1  Copy design 0  Total score 27  Basically normal exam. Was not able to say what country we live in and did not follow written command to "close your eyes". I believe she may have had trouble understanding what I wanted from those two. She also was unable to copy the design accurately. This could warrant further workup.       Screening Tests Health Maintenance  Topic Date Due  . TETANUS/TDAP  11/18/2016 (Originally 10/05/1964)  . INFLUENZA VACCINE  12/31/2016 (Originally 05/03/2016)  . MAMMOGRAM  05/25/2018  . COLONOSCOPY  05/29/2022  . DEXA SCAN  Completed  . ZOSTAVAX  Completed  . Hepatitis C Screening  Completed  . PNA vac Low Risk Adult  Completed      Plan:  Keep f/u with Bennie PieriniMary Margaret Martin, FNP  In Feb 2018.  During the course of the visit, Myishia was educated and counseled about the following appropriate screening and preventive services:   Vaccines to include Pneumoccal-up to date,  Influenza-declined, Tdap-updated today, Zostavax-up to date  Electrocardiogram-done 10/22/15  Cardiovascular disease screening-lipids done with routine labs  Colorectal cancer screening-Patient reported [facility]. I do not have documentation to support this but will research it.   Bone density screening- done 04/2016  Diabetes screening-with routine labs  Glaucoma screening-never done. Referral Placed.  Mammography-up to date  Nutrition counseling  Exercise- walk for 30 minutes 3 times per week. Do chair exercises daily. Handout given with instructions.   Patient Instructions (the written plan) were given to the patient.    Demetrios LollKristen Brigid Vandekamp, RN   10/13/2016   I have reviewed and agree with the above AWV documentation.   Mary-Margaret Daphine DeutscherMartin, FNP

## 2016-10-13 NOTE — Patient Instructions (Addendum)
[name] ,  Thank you for taking time to come for your Medicare Wellness Visit. I appreciate your ongoing commitment to your health goals. Please review the following plan we discussed and let me know if I can assist you in the future.   Keep f/u with Gennette Pac on 11/22/16.  These are the goals we discussed:  Goals    . Exercise 3x per week (30 min per time)          Try to walk for 30 minutes 3 times per week     Do chair exercises daily. See handout.   This is a list of the screening recommended for you and due dates:  Health Maintenance  Topic Date Due  . Tetanus Vaccine  11/18/2016*  . Flu Shot  12/31/2016*  . Mammogram  05/25/2018  . Colon Cancer Screening  05/29/2022  . DEXA scan (bone density measurement)  Completed  . Shingles Vaccine  Completed  .  Hepatitis C: One time screening is recommended by Center for Disease Control  (CDC) for  adults born from [birth year] through 1964.   Completed  . Pneumonia vaccines  Completed  *Topic was postponed. The date shown is not the original due date.    Tdap Vaccine (Tetanus, Diphtheria and Pertussis): What You Need to Know 1. Why get vaccinated? Tetanus, diphtheria and pertussis are very serious diseases. Tdap vaccine can protect [place] from these diseases. And, Tdap vaccine given to pregnant women can protect newborn babies against pertussis. TETANUS (Lockjaw) is rare in the [place] today. It causes painful muscle tightening and stiffness, usually all over the body.  It can lead to tightening of muscles in the head and neck so you can't open your mouth, swallow, or sometimes even breathe. Tetanus kills about 1 out of 10 people who are infected even after receiving the best medical care. DIPHTHERIA is also rare in the [place] today. It can cause a thick coating to form in the back of the throat.  It can lead to breathing problems, heart failure, paralysis, and death. PERTUSSIS (Whooping Cough) causes severe coughing  spells, which can cause difficulty breathing, vomiting and disturbed sleep.  It can also lead to weight loss, incontinence, and rib fractures. Up to 2 in 100 adolescents and 5 in 100 adults with pertussis are hospitalized or have complications, which could include pneumonia or death. These diseases are caused by bacteria. Diphtheria and pertussis are spread from person to person through secretions from coughing or sneezing. Tetanus enters the body through cuts, scratches, or wounds. Before vaccines, as many as 200,000 cases of diphtheria, 200,000 cases of pertussis, and hundreds of cases of tetanus, were reported in the [place] each year. Since vaccination began, reports of cases for tetanus and diphtheria have dropped by about 99% and for pertussis by about 80%. 2. Tdap vaccine Tdap vaccine can protect adolescents and adults from tetanus, diphtheria, and pertussis. One dose of Tdap is routinely given at age 37 or 45. People who did not get Tdap at that age should get it as soon as possible. Tdap is especially important for healthcare professionals and anyone having close contact with a baby younger than 12 months. Pregnant women should get a dose of Tdap during every pregnancy, to protect the newborn from pertussis. Infants are most at risk for severe, life-threatening complications from pertussis. Another vaccine, called Td, protects against tetanus and diphtheria, but not pertussis. A Td booster should be given every 10 years. Tdap may  be given as one of these boosters if you have never gotten Tdap before. Tdap may also be given after a severe cut or burn to prevent tetanus infection. Your doctor or the person giving you the vaccine can give you more information. Tdap may safely be given at the same time as other vaccines. 3. Some people should not get this vaccine  A person who has ever had a life-threatening allergic reaction after a previous dose of any diphtheria, tetanus or pertussis  containing vaccine, OR has a severe allergy to any part of this vaccine, should not get Tdap vaccine. Tell the person giving the vaccine about any severe allergies.  Anyone who had coma or long repeated seizures within 7 days after a childhood dose of DTP or DTaP, or a previous dose of Tdap, should not get Tdap, unless a cause other than the vaccine was found. They can still get Td.  Talk to your doctor if you:  have seizures or another nervous system problem,  had severe pain or swelling after any vaccine containing diphtheria, tetanus or pertussis,  ever had a condition called Guillain-Barr Syndrome (GBS),  aren't feeling well on the day the shot is scheduled. 4. Risks With any medicine, including vaccines, there is a chance of side effects. These are usually mild and go away on their own. Serious reactions are also possible but are rare. Most people who get Tdap vaccine do not have any problems with it. Mild problems following Tdap: (Did not interfere with activities)  Pain where the shot was given (about 3 in 4 adolescents or 2 in 3 adults)  Redness or swelling where the shot was given (about 1 person in 5)  Mild fever of at least 100.62F (up to about 1 in 25 adolescents or 1 in 100 adults)  Headache (about 3 or 4 people in 10)  Tiredness (about 1 person in 3 or 4)  Nausea, vomiting, diarrhea, stomach ache (up to 1 in 4 adolescents or 1 in 10 adults)  Chills, sore joints (about 1 person in 10)  Body aches (about 1 person in 3 or 4)  Rash, swollen glands (uncommon) Moderate problems following Tdap: (Interfered with activities, but did not require medical attention)  Pain where the shot was given (up to 1 in 5 or 6)  Redness or swelling where the shot was given (up to about 1 in 16 adolescents or 1 in 12 adults)  Fever over 102F (about 1 in 100 adolescents or 1 in 250 adults)  Headache (about 1 in 7 adolescents or 1 in 10 adults)  Nausea, vomiting, diarrhea, stomach  ache (up to 1 or 3 people in 100)  Swelling of the entire arm where the shot was given (up to about 1 in 500). Severe problems following Tdap: (Unable to perform usual activities; required medical attention)  Swelling, severe pain, bleeding and redness in the arm where the shot was given (rare). Problems that could happen after any vaccine:  People sometimes faint after a medical procedure, including vaccination. Sitting or lying down for about 15 minutes can help prevent fainting, and injuries caused by a fall. Tell your doctor if you feel dizzy, or have vision changes or ringing in the ears.  Some people get severe pain in the shoulder and have difficulty moving the arm where a shot was given. This happens very rarely.  Any medication can cause a severe allergic reaction. Such reactions from a vaccine are very rare, estimated at fewer than 1  in a million doses, and would happen within a few minutes to a few hours after the vaccination. As with any medicine, there is a very remote chance of a vaccine causing a serious injury or death. The safety of vaccines is always being monitored. For more information, visit: http://floyd.org/www.cdc.gov/vaccinesafety/ 5. What if there is a serious problem? What should I look for? Look for anything that concerns you, such as signs of a severe allergic reaction, very high fever, or unusual behavior. Signs of a severe allergic reaction can include hives, swelling of the face and throat, difficulty breathing, a fast heartbeat, dizziness, and weakness. These would usually start a few minutes to a few hours after the vaccination. What should I do?  If you think it is a severe allergic reaction or other emergency that can't wait, call 9-1-1 or get the person to the nearest hospital. Otherwise, call your doctor.  Afterward, the reaction should be reported to the Vaccine Adverse Event Reporting System (VAERS). Your doctor might file this report, or you can do it yourself through  the VAERS web site at www.vaers.LAgents.nohhs.gov, or by calling 1-(765)204-0104.  VAERS does not give medical advice. 6. The National Vaccine Injury Compensation Program The [employer] Vaccine Injury Compensation Program (VICP) is a federal program that was created to compensate people who may have been injured by certain vaccines. Persons who believe they may have been injured by a vaccine can learn about the program and about filing a claim by calling 1-204-782-4204 or visiting the VICP website at SpiritualWord.atwww.hrsa.gov/vaccinecompensation. There is a time limit to file a claim for compensation. 7. How can I learn more?  Ask your doctor. He or she can give you the vaccine package insert or suggest other sources of information.  Call your local or state health department.  Contact the Centers for Disease Control and Prevention (CDC):  Call 201-591-42981-4408885249 (1-800-CDC-INFO) or  Visit CDC's website at PicCapture.uywww.cdc.gov/vaccines CDC Tdap Vaccine VIS (11/26/13) This information is not intended to replace advice given to you by your health care provider. Make sure you discuss any questions you have with your health care provider. Document Released: 03/20/2012 Document Revised: 06/09/2016 Document Reviewed: 06/09/2016 Elsevier Interactive Patient Education  2017 [employer]

## 2016-10-27 ENCOUNTER — Encounter: Payer: Self-pay | Admitting: Nurse Practitioner

## 2016-10-27 DIAGNOSIS — H903 Sensorineural hearing loss, bilateral: Secondary | ICD-10-CM | POA: Diagnosis not present

## 2016-10-31 DIAGNOSIS — H43393 Other vitreous opacities, bilateral: Secondary | ICD-10-CM | POA: Diagnosis not present

## 2016-10-31 DIAGNOSIS — H43813 Vitreous degeneration, bilateral: Secondary | ICD-10-CM | POA: Diagnosis not present

## 2016-10-31 DIAGNOSIS — H2513 Age-related nuclear cataract, bilateral: Secondary | ICD-10-CM | POA: Diagnosis not present

## 2016-11-22 ENCOUNTER — Ambulatory Visit: Payer: Medicare Other | Admitting: Nurse Practitioner

## 2016-11-23 ENCOUNTER — Telehealth: Payer: Self-pay | Admitting: *Deleted

## 2016-11-23 NOTE — Telephone Encounter (Signed)
Aware to bring back stool specimen.

## 2016-12-02 ENCOUNTER — Encounter: Payer: Self-pay | Admitting: Nurse Practitioner

## 2016-12-02 ENCOUNTER — Ambulatory Visit (INDEPENDENT_AMBULATORY_CARE_PROVIDER_SITE_OTHER): Payer: Medicare Other | Admitting: Nurse Practitioner

## 2016-12-02 VITALS — BP 114/78 | HR 86 | Temp 96.6°F | Ht 64.0 in | Wt 195.0 lb

## 2016-12-02 DIAGNOSIS — E6609 Other obesity due to excess calories: Secondary | ICD-10-CM

## 2016-12-02 DIAGNOSIS — E8881 Metabolic syndrome: Secondary | ICD-10-CM

## 2016-12-02 DIAGNOSIS — I10 Essential (primary) hypertension: Secondary | ICD-10-CM

## 2016-12-02 DIAGNOSIS — Z6833 Body mass index (BMI) 33.0-33.9, adult: Secondary | ICD-10-CM

## 2016-12-02 DIAGNOSIS — E7800 Pure hypercholesterolemia, unspecified: Secondary | ICD-10-CM

## 2016-12-02 MED ORDER — VALSARTAN-HYDROCHLOROTHIAZIDE 160-25 MG PO TABS: 0.5000 | ORAL_TABLET | Freq: Every day | ORAL | 1 refills | Status: DC

## 2016-12-02 MED ORDER — SIMVASTATIN 40 MG PO TABS: 40.0000 mg | ORAL_TABLET | Freq: Every day | ORAL | 1 refills | Status: DC

## 2016-12-02 NOTE — Patient Instructions (Signed)
Cholesterol Cholesterol is a fat. Your body needs a small amount of cholesterol. Cholesterol (plaque) may build up in your blood vessels (arteries). That makes you more likely to have a heart attack or stroke. You cannot feel your cholesterol level. Having a blood test is the only way to find out if your level is high. Keep your test results. Work with your doctor to keep your cholesterol at a good level. What do the results mean?  Total cholesterol is how much cholesterol is in your blood.  LDL is bad cholesterol. This is the type that can build up. Try to have low LDL.  HDL is good cholesterol. It cleans your blood vessels and carries LDL away. Try to have high HDL.  Triglycerides are fat that the body can store or burn for energy. What are good levels of cholesterol?  Total cholesterol below 200.  LDL below 100 is good for people who have health risks. LDL below 70 is good for people who have very high risks.  HDL above 40 is good. It is best to have HDL of 60 or higher.  Triglycerides below 150. How can I lower my cholesterol? Diet  Follow your diet program as told by your doctor.  Choose fish, white meat chicken, or turkey that is roasted or baked. Try not to eat red meat, fried foods, sausage, or lunch meats.  Eat lots of fresh fruits and vegetables.  Choose whole grains, beans, pasta, potatoes, and cereals.  Choose olive oil, corn oil, or canola oil. Only use small amounts.  Try not to eat butter, mayonnaise, shortening, or palm kernel oils.  Try not to eat foods with trans fats.  Choose low-fat or nonfat dairy foods.  Drink skim or nonfat milk.  Eat low-fat or nonfat yogurt and cheeses.  Try not to drink whole milk or cream.  Try not to eat ice cream, egg yolks, or full-fat cheeses.  Healthy desserts include angel food cake, ginger snaps, animal crackers, hard candy, popsicles, and low-fat or nonfat frozen yogurt. Try not to eat pastries, cakes, pies, and  cookies. Exercise  Follow your exercise program as told by your doctor.  Be more active. Try gardening, walking, and taking the stairs.  Ask your doctor about ways that you can be more active. Medicine  Take over-the-counter and prescription medicines only as told by your doctor. This information is not intended to replace advice given to you by your health care provider. Make sure you discuss any questions you have with your health care provider. Document Released: 12/16/2008 Document Revised: 04/20/2016 Document Reviewed: 03/31/2016 Elsevier Interactive Patient Education  2017 Elsevier Inc.  

## 2016-12-02 NOTE — Progress Notes (Signed)
Subjective:    Patient ID: Ariel [name], female    DOB: Oct 14, 1945, 71 y.o.   MRN: [id number]  Patient here today for follow up of chronic medical problems.  Outpatient Encounter Prescriptions as of 05/18/2016  Medication Sig  . acetaminophen-codeine (TYLENOL #4) 300-60 MG tablet Take 1-2 tablets by mouth every 6 (six) hours as needed for moderate pain.  . simvastatin (ZOCOR) 40 MG tablet Take 1 tablet (40 mg total) by mouth at bedtime.  . valsartan-hydrochlorothiazide (DIOVAN-HCT) 160-25 MG tablet Take 0.5 tablets by mouth daily.   * had left carpal tunnel surgery on March 17, 2016- doing well has follow up with surgeon next month.  Hypertension  This is a chronic problem. The current episode started more than 1 year ago. The problem is unchanged. The problem is controlled. Pertinent negatives include no chest pain, headaches, orthopnea, palpitations or shortness of breath. Risk factors for coronary artery disease include dyslipidemia, obesity, post-menopausal state and sedentary lifestyle. Past treatments include ACE inhibitors and diuretics. The current treatment provides moderate improvement. Compliance problems include diet and exercise.  There is no history of CAD/MI or CVA.  Hyperlipidemia  This is a chronic problem. The current episode started more than 1 year ago. The problem is uncontrolled. Recent lipid tests were reviewed and are normal. Exacerbating diseases include obesity. She has no history of diabetes or hypothyroidism. Pertinent negatives include no chest pain or shortness of breath. Current antihyperlipidemic treatment includes statins. The current treatment provides moderate improvement of lipids. Compliance problems include adherence to diet and adherence to exercise.  Risk factors for coronary artery disease include dyslipidemia, hypertension, post-menopausal and obesity.  metabolic syndrome Trying to watch diet- has nit had hgba1c drawn- does not check blood sugars at  home.   Review of Systems  Constitutional: Negative.   HENT: Negative.   Respiratory: Negative for shortness of breath.   Cardiovascular: Negative for chest pain, palpitations and orthopnea.  Neurological: Negative.  Negative for headaches.  Psychiatric/Behavioral: Negative.   All other systems reviewed and are negative.      Objective:   Physical Exam  Constitutional: She is oriented to person, place, and time. She appears well-developed and well-nourished.  HENT:  Nose: Nose normal.  Mouth/Throat: Oropharynx is clear and moist.  Eyes: EOM are normal.  Neck: Trachea normal, normal range of motion and full passive range of motion without pain. Neck supple. No JVD present. Carotid bruit is not present. No thyromegaly present.  Cardiovascular: Normal rate, regular rhythm, normal heart sounds and intact distal pulses.  Exam reveals no gallop and no friction rub.   No murmur heard. Pulmonary/Chest: Effort normal and breath sounds normal.  Abdominal: Soft. Bowel sounds are normal. She exhibits no distension and no mass. There is no tenderness.  Musculoskeletal: Normal range of motion.  Lymphadenopathy:    She has no cervical adenopathy.  Neurological: She is alert and oriented to person, place, and time. She has normal reflexes.  Skin: Skin is warm and dry.  Psychiatric: She has a normal mood and affect. Her behavior is normal. Judgment and thought content normal.    BP 114/78   Pulse 86   Temp (!) 96.6 F (35.9 C) (Oral)   Ht _0  (1.626 m)   Wt 195 lb (88.5 kg)   BMI 33.47 kg/m         Assessment & Plan:  1. Essential hypertension Low sodium diet - valsartan-hydrochlorothiazide (DIOVAN-HCT) 160-25 MG tablet; Take 0.5 tablets by mouth daily.  Dispense: 90 tablet; Refill: 1 - CMP14+EGFR  2. Pure hypercholesterolemia Low fat diet - simvastatin (ZOCOR) 40 MG tablet; Take 1 tablet (40 mg total) by mouth at bedtime.  Dispense: 90 tablet; Refill: 1 - Lipid panel  3.  Metabolic syndrome Watch carbs in diet  4. Class 1 obesity due to excess calories with serious comorbidity and body mass index (BMI) of 33.0 to 33.9 in adult Discussed diet and exercise for person with BMI >25 Will recheck weight in 3-6 months    Labs pending Health maintenance reviewed Diet and exercise encouraged Continue all meds Follow up  In 6 months   [place], FNP

## 2016-12-03 LAB — LIPID PANEL
CHOL/HDL RATIO: 4.1 ratio (ref 0.0–4.4)
Cholesterol, Total: 217 mg/dL — ABNORMAL HIGH (ref 100–199)
HDL: 53 mg/dL (ref >39)
LDL CALC: 140 mg/dL — AB (ref 0–99)
TRIGLYCERIDES: 119 mg/dL (ref 0–149)
VLDL Cholesterol Cal: 24 mg/dL (ref 5–40)

## 2016-12-03 LAB — CMP14+EGFR
ALBUMIN: 4.4 g/dL (ref 3.5–4.8)
ALK PHOS: 82 IU/L (ref 39–117)
ALT: 17 IU/L (ref 0–32)
AST: 17 IU/L (ref 0–40)
Albumin/Globulin Ratio: 1.5 (ref 1.2–2.2)
BUN / CREAT RATIO: 25 (ref 12–28)
BUN: 16 mg/dL (ref 8–27)
CHLORIDE: 100 mmol/L (ref 96–106)
CO2: 26 mmol/L (ref 18–29)
CREATININE: 0.63 mg/dL (ref 0.57–1.00)
Calcium: 10.1 mg/dL (ref 8.7–10.3)
GFR calc Af Amer: 105 mL/min/{1.73_m2} (ref >59)
GFR calc non Af Amer: 91 mL/min/{1.73_m2} (ref >59)
GLOBULIN, TOTAL: 2.9 g/dL (ref 1.5–4.5)
Glucose: 94 mg/dL (ref 65–99)
Potassium: 4.1 mmol/L (ref 3.5–5.2)
SODIUM: 141 mmol/L (ref 134–144)
Total Protein: 7.3 g/dL (ref 6.0–8.5)

## 2016-12-05 ENCOUNTER — Other Ambulatory Visit: Payer: Self-pay | Admitting: Nurse Practitioner

## 2016-12-05 MED ORDER — ATORVASTATIN CALCIUM 40 MG PO TABS: 40.0000 mg | ORAL_TABLET | Freq: Every day | ORAL | 1 refills | Status: DC

## 2016-12-16 ENCOUNTER — Telehealth: Payer: Self-pay | Admitting: Nurse Practitioner

## 2016-12-16 NOTE — Telephone Encounter (Signed)
Patient called stating since starting Lipitor she has had abdominal cramps and constipation. Patient would like to know if this can be switched to something different

## 2016-12-16 NOTE — Telephone Encounter (Signed)
Try just taking every other day first and se how she does.

## 2016-12-16 NOTE — Telephone Encounter (Signed)
Patient advised of recommendation and verbalizes understanding. 

## 2016-12-21 ENCOUNTER — Telehealth: Payer: Self-pay | Admitting: Nurse Practitioner

## 2016-12-21 NOTE — Telephone Encounter (Signed)
Aware. [name] out of office.

## 2017-03-27 ENCOUNTER — Other Ambulatory Visit: Payer: Self-pay

## 2017-03-27 NOTE — Telephone Encounter (Signed)
This being sent over from [facility]  Not on patients med list

## 2017-03-28 MED ORDER — IBUPROFEN 800 MG PO TABS: 800.0000 mg | ORAL_TABLET | Freq: Three times a day (TID) | ORAL | 0 refills | Status: DC | PRN

## 2017-05-18 ENCOUNTER — Telehealth: Payer: Self-pay | Admitting: Nurse Practitioner

## 2017-05-22 MED ORDER — ROSUVASTATIN CALCIUM 10 MG PO TABS: 10.0000 mg | ORAL_TABLET | Freq: Every day | ORAL | 1 refills | Status: DC

## 2017-05-22 NOTE — Telephone Encounter (Signed)
Changed lipitor to crestor- rx sent to pharmacy

## 2017-05-22 NOTE — Telephone Encounter (Signed)
LM - aware of the change

## 2017-09-06 ENCOUNTER — Ambulatory Visit (INDEPENDENT_AMBULATORY_CARE_PROVIDER_SITE_OTHER): Payer: Medicare Other | Admitting: Nurse Practitioner

## 2017-09-06 ENCOUNTER — Encounter: Payer: Self-pay | Admitting: Nurse Practitioner

## 2017-09-06 VITALS — BP 124/72 | HR 96 | Temp 97.0°F | Ht 64.0 in | Wt 191.0 lb

## 2017-09-06 DIAGNOSIS — R350 Frequency of micturition: Secondary | ICD-10-CM | POA: Diagnosis not present

## 2017-09-06 DIAGNOSIS — R300 Dysuria: Secondary | ICD-10-CM | POA: Diagnosis not present

## 2017-09-06 LAB — URINALYSIS, COMPLETE
Bilirubin, UA: NEGATIVE
GLUCOSE, UA: NEGATIVE
Ketones, UA: NEGATIVE
Leukocytes, UA: NEGATIVE
NITRITE UA: NEGATIVE
Specific Gravity, UA: 1.025 (ref 1.005–1.030)
Urobilinogen, Ur: 0.2 mg/dL (ref 0.2–1.0)
pH, UA: 5.5 (ref 5.0–7.5)

## 2017-09-06 LAB — MICROSCOPIC EXAMINATION
Epithelial Cells (non renal): >10 /hpf — AB (ref 0–10)
Renal Epithel, UA: NONE SEEN /hpf

## 2017-09-06 MED ORDER — CIPROFLOXACIN HCL 500 MG PO TABS: 500.0000 mg | ORAL_TABLET | Freq: Two times a day (BID) | ORAL | 0 refills | Status: DC

## 2017-09-06 NOTE — Progress Notes (Signed)
   Subjective:    Patient ID: Ariel [name], female    DOB: 10/03/1946, 71 y.o.   MRN: [id number]  HPI  Patient in c/o dysuria and frequency. SHe is leaving on a cruise this weekend and wants to make sure ok before she goes.   Review of Systems  Constitutional: Negative for chills and fever.  HENT: Negative.   Respiratory: Negative.   Cardiovascular: Negative.   Gastrointestinal: Positive for abdominal pain (suprapubic pain).  Genitourinary: Positive for dysuria, frequency and urgency.  Neurological: Negative.   Psychiatric/Behavioral: Negative.   All other systems reviewed and are negative.      Objective:   Physical Exam  Constitutional: She is oriented to person, place, and time. She appears well-developed and well-nourished. No distress.  Cardiovascular: Normal rate and regular rhythm.  Pulmonary/Chest: Effort normal and breath sounds normal.  Abdominal: Soft. Bowel sounds are normal. There is tenderness (mils suprapubic pain on palpation).  Genitourinary:  Genitourinary Comments: No CVA tenderness  Neurological: She is alert and oriented to person, place, and time.  Skin: Skin is warm and dry.  Psychiatric: She has a normal mood and affect. Judgment and thought content normal.    BP 124/72   Pulse 96   Temp (!) 97 F (36.1 C)   Ht 5\' 4"  (1.626 m)   Wt 191 lb (86.6 kg)   BMI 32.79 kg/m   UA- many bacteria but otherwise clear.      Assessment & Plan:   1. Frequent urination   2. Dysuria    Take medication as prescribe Cotton underwear Take shower not bath Cranberry juice, yogurt Force fluids AZO over the counter X2 days RTO prn Pocket rx for cipro to take on board cruise ship in case needed  Meds ordered this encounter  Medications  . ciprofloxacin (CIPRO) 500 MG tablet    Sig: Take 1 tablet (500 mg total) by mouth 2 (two) times daily.    Dispense:  20 tablet    Refill:  0    Order Specific Question:   Supervising Provider    Answer:   Johna SheriffVINCENT,  CAROL L [[id number]]   Mary-Margaret Daphine DeutscherMartin, FNP

## 2017-10-19 ENCOUNTER — Other Ambulatory Visit: Payer: Medicare Other | Admitting: Nurse Practitioner

## 2017-10-24 ENCOUNTER — Ambulatory Visit: Payer: Medicare Other | Admitting: *Deleted

## 2017-10-27 ENCOUNTER — Other Ambulatory Visit: Payer: Self-pay | Admitting: Nurse Practitioner

## 2017-11-24 ENCOUNTER — Other Ambulatory Visit: Payer: Self-pay | Admitting: *Deleted

## 2017-11-24 MED ORDER — ROSUVASTATIN CALCIUM 10 MG PO TABS: 10.0000 mg | ORAL_TABLET | Freq: Every day | ORAL | 0 refills | Status: DC

## 2017-11-27 ENCOUNTER — Other Ambulatory Visit: Payer: Self-pay | Admitting: *Deleted

## 2017-11-27 DIAGNOSIS — I10 Essential (primary) hypertension: Secondary | ICD-10-CM

## 2017-11-27 MED ORDER — VALSARTAN-HYDROCHLOROTHIAZIDE 160-25 MG PO TABS: 0.5000 | ORAL_TABLET | Freq: Every day | ORAL | 0 refills | Status: DC

## 2017-12-25 LAB — HM MAMMOGRAPHY

## 2018-01-05 ENCOUNTER — Other Ambulatory Visit: Payer: Medicare Other | Admitting: Nurse Practitioner

## 2018-01-09 ENCOUNTER — Other Ambulatory Visit: Payer: Self-pay | Admitting: Nurse Practitioner

## 2018-01-09 NOTE — Telephone Encounter (Signed)
Last refill without being seen 

## 2018-01-15 ENCOUNTER — Other Ambulatory Visit: Payer: Medicare Other | Admitting: Nurse Practitioner

## 2018-01-16 ENCOUNTER — Encounter: Payer: Self-pay | Admitting: Nurse Practitioner

## 2018-01-20 ENCOUNTER — Other Ambulatory Visit: Payer: Self-pay | Admitting: Nurse Practitioner

## 2018-02-15 ENCOUNTER — Other Ambulatory Visit: Payer: Self-pay | Admitting: Nurse Practitioner

## 2018-02-28 ENCOUNTER — Encounter: Payer: Self-pay | Admitting: Family Medicine

## 2018-02-28 ENCOUNTER — Ambulatory Visit (INDEPENDENT_AMBULATORY_CARE_PROVIDER_SITE_OTHER): Payer: Medicare Other | Admitting: Family Medicine

## 2018-02-28 ENCOUNTER — Other Ambulatory Visit: Payer: Self-pay

## 2018-02-28 VITALS — BP 99/67 | HR 92 | Temp 98.6°F | Ht 64.0 in | Wt 191.0 lb

## 2018-02-28 DIAGNOSIS — J069 Acute upper respiratory infection, unspecified: Secondary | ICD-10-CM | POA: Diagnosis not present

## 2018-02-28 MED ORDER — BENZONATATE 200 MG PO CAPS: 200.0000 mg | ORAL_CAPSULE | Freq: Two times a day (BID) | ORAL | 0 refills | Status: DC | PRN

## 2018-02-28 MED ORDER — HYDROCODONE-HOMATROPINE 5-1.5 MG/5ML PO SYRP: 5.0000 mL | ORAL_SOLUTION | Freq: Four times a day (QID) | ORAL | 0 refills | Status: DC | PRN

## 2018-02-28 MED ORDER — FLUTICASONE PROPIONATE 50 MCG/ACT NA SUSP: 1.0000 | Freq: Two times a day (BID) | NASAL | 6 refills | Status: DC | PRN

## 2018-02-28 NOTE — Telephone Encounter (Signed)
Patient requesting a different cough medication to be sent to [facility] because of cost of Tessalon Pearles.

## 2018-02-28 NOTE — Progress Notes (Signed)
BP 99/67   Pulse 92   Temp 98.6 F (37 C) (Oral)   Ht  (1.626 m)   Wt 191 lb (86.6 kg)   BMI 32.79 kg/m    Subjective:    Patient ID: Ariel [name], female    DOB: November 16, 1945, 72 y.o.   MRN: [id number]  HPI: Patient is a 72yr old female who presents to the clinic today with 3-day history of productive cough, congestion, drainage and sore throat. Patient reports that her symptoms began late Monday afternoon and have worsened over the last couple of days. She describes coughing up a "thick yellow" sputum and says the coughing is keeping her up at night. She describes her sinus drainage and "clear and watery." She says her appetite has decreased reports being constipated for 2-3 days. She has been gargling with warm salt water to help soothe her throat and motrin to help her sleep at night. She denies fever, chills, and NVD. She denies any sick contacts.   Relevant past medical, surgical, family and social history reviewed and updated as indicated. Interim medical history since our last visit reviewed. Allergies and medications reviewed and updated.  Review of Systems  Constitutional: Positive for appetite change. Negative for fever.       Decreased appetite  HENT: Positive for congestion, rhinorrhea and sore throat. Negative for sinus pressure and sneezing.   Eyes: Negative.   Respiratory: Positive for cough.   Cardiovascular: Negative.   Gastrointestinal: Positive for constipation.  Endocrine: Negative.   Genitourinary: Negative.   Musculoskeletal:       Superficial abdominal pains from excessive coughing  Skin: Negative.   Allergic/Immunologic: Negative.   Neurological: Negative.   Hematological: Negative.   Psychiatric/Behavioral: Negative.       Per HPI unless specifically indicated above   Allergies as of 02/28/2018   No Known Allergies     Medication List        Accurate as of 02/28/18  2:25 PM. Always use your most recent med list.          benzonatate  200 MG capsule Commonly known as:  TESSALON Take 1 capsule (200 mg total) by mouth 2 (two) times daily as needed for cough.   fluticasone 50 MCG/ACT nasal spray Commonly known as:  FLONASE Place 1 spray into both nostrils 2 (two) times daily as needed for allergies or rhinitis.   ibuprofen 800 MG tablet Commonly known as:  ADVIL,MOTRIN TAKE 1 TABLET BY MOUTH EVERY 8 HOURS AS NEEDED   rosuvastatin 10 MG tablet Commonly known as:  CRESTOR TAKE 1 TABLET BY MOUTH EVERY DAY   valsartan-hydrochlorothiazide 160-25 MG tablet Commonly known as:  DIOVAN-HCT Take 0.5 tablets by mouth daily.          Objective:    BP 99/67   Pulse 92   Temp 98.6 F (37 C) (Oral)   Ht  (1.626 m)   Wt 191 lb (86.6 kg)   BMI 32.79 kg/m   Wt Readings from Last 3 Encounters:  02/28/18 191 lb (86.6 kg)  09/06/17 191 lb (86.6 kg)  12/02/16 195 lb (88.5 kg)    Physical Exam  Constitutional: She is oriented to person, place, and time. She appears well-developed and well-nourished. No distress.  HENT:  Head: Normocephalic.  Right Ear: External ear normal.  Left Ear: External ear normal.  Mouth/Throat: Posterior oropharyngeal edema present. No oropharyngeal exudate or posterior oropharyngeal erythema.  Eyes: Conjunctivae are normal.  Cardiovascular: Normal rate, regular  rhythm and normal heart sounds.  Pulmonary/Chest: Effort normal and breath sounds normal. She has no wheezes.  Cough  Lymphadenopathy:    She has no cervical adenopathy.  Neurological: She is alert and oriented to person, place, and time.  Skin: Skin is warm and dry.  Psychiatric: She has a normal mood and affect.  Nursing note and vitals reviewed.        Assessment & Plan:   Patient presents to the clinic today with 3-day history of productive cough, congestion, rhinorrhea and sore throat. She is afebrile, denies NVD and denies sick contacts. This history and her presentation are consistent with a viral upper respiratory  infection.  I have prescribed Tessalon Perles to help with cough. I have also instructed the patient to use Flonase nasal spray, one spray in each nare before bed to help with congestion. I have also recommended Mucinex as an additional decongestant.    Problem List Items Addressed This Visit    None    Visit Diagnoses    Viral upper respiratory infection    -  Primary   Relevant Medications   fluticasone (FLONASE) 50 MCG/ACT nasal spray   benzonatate (TESSALON) 200 MG capsule       Follow up plan: I have made the patient aware that it may be 3-4 days before she sees any major improvement in her symptoms, but she has been instructed to contact our office at the first of next week if her symptoms worsen or have not improved.   Celedonio Miyamoto, PA-S  Counseling provided for all of the vaccine components No orders of the defined types were placed in this encounter.  Patient seen and examined with Liana Crocker. Agree with assessment and plan above.   Arville Care, MD [facility] 02/28/2018, 2:25 PM

## 2018-02-28 NOTE — Addendum Note (Signed)
Addended by: Arville Care on: 02/28/2018 04:39 PM   Modules accepted: Orders

## 2018-03-06 ENCOUNTER — Ambulatory Visit (INDEPENDENT_AMBULATORY_CARE_PROVIDER_SITE_OTHER): Payer: Medicare Other | Admitting: Physician Assistant

## 2018-03-06 ENCOUNTER — Encounter: Payer: Self-pay | Admitting: Physician Assistant

## 2018-03-06 ENCOUNTER — Ambulatory Visit: Payer: Medicare Other | Admitting: Family Medicine

## 2018-03-06 VITALS — BP 114/77 | HR 88 | Temp 98.6°F | Ht 64.0 in | Wt 193.0 lb

## 2018-03-06 DIAGNOSIS — J029 Acute pharyngitis, unspecified: Secondary | ICD-10-CM | POA: Diagnosis not present

## 2018-03-06 MED ORDER — AMOXICILLIN 875 MG PO TABS
875.0000 mg | ORAL_TABLET | Freq: Two times a day (BID) | ORAL | 0 refills | Status: DC
Start: 2018-03-06 — End: 2018-05-11

## 2018-03-06 NOTE — Progress Notes (Signed)
  Subjective:     Patient ID: Ariel [name], female   DOB: 12/26/1945, 72 y.o.   MRN: [id number]  HPI Pt here for f/u of S/T and cough Seen by Dr Dettinger on 5/29 Pharyngitis has worsened  Review of Systems  Constitutional: Negative.   HENT: Positive for congestion, postnasal drip, rhinorrhea, sinus pressure and sore throat. Negative for ear pain, sneezing and voice change.   Respiratory: Positive for cough. Negative for shortness of breath and wheezing.        Prod cough of yellow sputum  Cardiovascular: Negative.        Objective:   Physical Exam  Constitutional: She appears well-developed and well-nourished. No distress.  HENT:  Right Ear: External ear normal.  Left Ear: External ear normal.  Mouth/Throat: Oropharynx is clear and moist. No oropharyngeal exudate.  Canals and TM's nl + erythema to the R tonsillar area with some enlargement  Neck: Neck supple.  TTP of R ant cerv node  Cardiovascular: Normal rate, regular rhythm and normal heart sounds.  Pulmonary/Chest: Effort normal and breath sounds normal.  Lymphadenopathy:    She has cervical adenopathy.  Skin: She is not diaphoretic.  Nursing note and vitals reviewed.      Assessment:     1. Pharyngitis, unspecified etiology        Plan:     Rest Fluids Continue other meds Add Amox 875mg  bid x 10 days F/U prn

## 2018-03-06 NOTE — Patient Instructions (Signed)

## 2018-03-14 ENCOUNTER — Other Ambulatory Visit: Payer: Self-pay | Admitting: Nurse Practitioner

## 2018-03-15 ENCOUNTER — Other Ambulatory Visit: Payer: Self-pay | Admitting: Nurse Practitioner

## 2018-03-20 ENCOUNTER — Ambulatory Visit: Payer: Medicare Other | Admitting: Nurse Practitioner

## 2018-03-21 ENCOUNTER — Encounter: Payer: Self-pay | Admitting: Nurse Practitioner

## 2018-04-19 ENCOUNTER — Other Ambulatory Visit: Payer: Self-pay | Admitting: Nurse Practitioner

## 2018-05-11 ENCOUNTER — Other Ambulatory Visit: Payer: Self-pay

## 2018-05-11 ENCOUNTER — Ambulatory Visit (INDEPENDENT_AMBULATORY_CARE_PROVIDER_SITE_OTHER): Payer: Medicare Other

## 2018-05-11 VITALS — BP 106/69 | HR 80 | Temp 97.1°F | Ht 64.0 in | Wt 191.0 lb

## 2018-05-11 DIAGNOSIS — Z0000 Encounter for general adult medical examination without abnormal findings: Secondary | ICD-10-CM

## 2018-05-11 MED ORDER — ROSUVASTATIN CALCIUM 10 MG PO TABS: 10.0000 mg | ORAL_TABLET | Freq: Every day | ORAL | 0 refills | Status: DC

## 2018-05-11 NOTE — Patient Instructions (Signed)
  Ariel [name] , Thank you for taking time to come for your Medicare Wellness Visit. I appreciate your ongoing commitment to your health goals. Please review the following plan we discussed and let me know if I can assist you in the future.   These are the goals we discussed: Goals    . DIET - EAT MORE FRUITS AND VEGETABLES    . Exercise 3x per week (30 min per time)     Try to walk for 30 minutes 3 times per week       This is a list of the screening recommended for you and due dates:  Health Maintenance  Topic Date Due  . Flu Shot  06/09/2018*  . Mammogram  12/26/2019  . Colon Cancer Screening  05/29/2022  . Tetanus Vaccine  10/13/2026  . DEXA scan (bone density measurement)  Completed  .  Hepatitis C: One time screening is recommended by Center for Disease Control  (CDC) for  adults born from [birth year] through 1964.   Completed  . Pneumonia vaccines  Completed  *Topic was postponed. The date shown is not the original due date.

## 2018-05-11 NOTE — Progress Notes (Signed)
Subjective:   Ariel [name] is a 72 y.o. female who presents for Medicare Annual (Subsequent) preventive examination. She currently resides in [place]. She is a widow and lives alone. She is originally from [place] and is retired from the [employer]. 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 She has one daughter that lives in [place] and several grandchildren. She visits with them frequently. She enjoys visiting with her family often. She also had one son that is not deceased. Patient does not have any type of advanced directive and declines information at this time. All immunizations are up to date.   Review of Systems:   Cardiac Risk Factors include: advanced age (>33men, >47 women);dyslipidemia;hypertension;obesity (BMI >30kg/m2)     Objective:     Vitals: BP 106/69   Pulse 80   Temp (!) 97.1 F (36.2 C) (Oral)   Ht 5\' 4"  (1.626 m)   Wt 191 lb (86.6 kg)   BMI 32.79 kg/m   Body mass index is 32.79 kg/m.  Advanced Directives 05/11/2018 10/13/2016 03/17/2016 03/15/2016 06/21/2014  Does Patient Have a Medical Advance Directive? No No No No No  Would patient like information on creating a medical advance directive? No - Patient declined No - Patient declined - No - patient declined information No - patient declined information    Tobacco Social History   Tobacco Use  Smoking Status Never Smoker  Smokeless Tobacco Never Used     Patient has never been a smoker  Clinical Intake:  Pre-visit preparation completed: No  Pain : No/denies pain     BMI - recorded: 33.11 Nutritional Status: BMI > 30  Obese Nutritional Risks: None Diabetes: No  How often do you need to have someone help you when you read instructions, pamphlets, or other written materials from your doctor or pharmacy?: 1 - Never What is the last grade level you completed in school?: 12th grade  Interpreter Needed?: No  Information entered by :: Mckinley Jewel LPN  Past Medical  History:  Diagnosis Date  . Arthritis   . Diverticulosis   . Hyperlipidemia   . Hypertension   . Osteopenia    Past Surgical History:  Procedure Laterality Date  . ABDOMINAL HYSTERECTOMY  1985  . CARPAL TUNNEL RELEASE Right 2007  . CARPAL TUNNEL RELEASE Left 03/17/2016   Procedure: LEFT CARPAL TUNNEL RELEASE;  Surgeon: Vickki Hearing, MD;  Location: [facility];  Service: Orthopedics;  Laterality: Left;   Family History  Problem Relation Age of Onset  . Diabetes Mother   . Cancer Father 4       colon  . Alcohol abuse Brother   . Cancer Brother        stomach  . Lupus Brother    Social History   Socioeconomic History  . Marital status: Widowed    Spouse name: Not on file  . Number of children: Not on file  . Years of education: Not on file  . Highest education level: 12th grade  Occupational History  . Occupation: Retired    Comment: Retired from the [employer]  . Financial resource strain: Not hard at all  . Food insecurity:    Worry: Never true    Inability: Never true  . Transportation needs:    Medical: Yes    Non-medical: No  Tobacco Use  . Smoking status: Never Smoker  . Smokeless tobacco: Never Used  Substance and Sexual Activity  .  Alcohol use: Yes    Comment: social  . Drug use: No  . Sexual activity: Yes    Birth control/protection: Surgical  Lifestyle  . Physical activity:    Days per week: 3 days    Minutes per session: 30 min  . Stress: Not at all  Relationships  . Social connections:    Talks on phone: More than three times a week    Gets together: More than three times a week    Attends religious service: More than 4 times per year    Active member of club or organization: No    Attends meetings of clubs or organizations: Never    Relationship status: Widowed  Other Topics Concern  . Not on file  Social History Narrative  . Not on file    Outpatient Encounter Medications as of 05/11/2018  Medication Sig  . rosuvastatin  (CRESTOR) 10 MG tablet Take 1 tablet (10 mg total) by mouth daily.  . valsartan-hydrochlorothiazide (DIOVAN-HCT) 160-25 MG tablet Take 0.5 tablets by mouth daily.  . [DISCONTINUED] amoxicillin (AMOXIL) 875 MG tablet Take 1 tablet (875 mg total) by mouth 2 (two) times daily.  . [DISCONTINUED] benzonatate (TESSALON) 200 MG capsule Take 1 capsule (200 mg total) by mouth 2 (two) times daily as needed for cough. (Patient not taking: Reported on 03/06/2018)  . [DISCONTINUED] fluticasone (FLONASE) 50 MCG/ACT nasal spray Place 1 spray into both nostrils 2 (two) times daily as needed for allergies or rhinitis.  . [DISCONTINUED] HYDROcodone-homatropine (HYCODAN) 5-1.5 MG/5ML syrup Take 5 mLs by mouth every 6 (six) hours as needed for cough.  . [DISCONTINUED] ibuprofen (ADVIL,MOTRIN) 800 MG tablet TAKE 1 TABLET BY MOUTH EVERY 8 HOURS AS NEEDED   No facility-administered encounter medications on file as of 05/11/2018.     Activities of Daily Living In your present state of health, do you have any difficulty performing the following activities: 05/11/2018  Hearing? N  Vision? Y  Comment Has reading glasses  Difficulty concentrating or making decisions? N  Walking or climbing stairs? N  Dressing or bathing? N  Doing errands, shopping? N  Preparing Food and eating ? N  Using the Toilet? N  In the past six months, have you accidently leaked urine? N  Do you have problems with loss of bowel control? N  Managing your Medications? N  Managing your Finances? N  Housekeeping or managing your Housekeeping? N  Some recent data might be hidden    Patient Care Team: Bennie PieriniMartin, Mary-Margaret, FNP as PCP - General (Nurse Practitioner) Vickki HearingHarrison, Stanley E, MD as Consulting Physician (Orthopedic Surgery)    Assessment:   This is a routine wellness examination for Ariel [name].  Exercise Activities and Dietary recommendations Current Exercise Habits: The patient does not participate in regular exercise at present,  Exercise limited by: None identified  Goals    . DIET - EAT MORE FRUITS AND VEGETABLES    . Exercise 3x per week (30 min per time)     Try to walk for 30 minutes 3 times per week       Fall Risk Fall Risk  05/11/2018 03/06/2018 02/28/2018 12/02/2016 10/13/2016  Falls in the past year? No No No No No   Is the patient's home free of loose throw rugs in walkways, pet beds, electrical cords, etc?   yes      Grab bars in the bathroom? no      Handrails on the stairs?   yes      Adequate  lighting?   yes    Depression Screen PHQ 2/9 Scores 05/11/2018 03/06/2018 02/28/2018 12/02/2016  PHQ - 2 Score 0 0 0 0     Cognitive Function MMSE - Mini Mental State Exam 05/11/2018 10/13/2016  Orientation to time 5 5  Orientation to Place 5 4  Registration 3 3  Attention/ Calculation 5 5  Recall 3 3  Language- name 2 objects 2 2  Language- repeat 1 1  Language- follow 3 step command 3 3  Language- read & follow direction 1 0  Write a sentence 1 1  Copy design 1 0  Total score 30 27    Patient had no problem with memory    Immunization History  Administered Date(s) Administered  . Influenza Whole 08/03/2012  . Pneumococcal Conjugate-13 12/08/2014  . Pneumococcal Polysaccharide-23 05/18/2016  . Tdap 10/13/2016  . Zoster 04/29/2014    Qualifies for Shingles Vaccine? Will discuss with patient at next office visit  Screening Tests Health Maintenance  Topic Date Due  . INFLUENZA VACCINE  06/09/2018 (Originally 05/03/2018)  . MAMMOGRAM  12/26/2019  . COLONOSCOPY  05/29/2022  . TETANUS/TDAP  10/13/2026  . DEXA SCAN  Completed  . Hepatitis C Screening  Completed  . PNA vac Low Risk Adult  Completed    Cancer Screenings: Lung: Low Dose CT Chest recommended if Age 66-80 years, 30 pack-year currently smoking OR have quit w/in 15years. Patient does not qualify. Breast:  Up to date on Mammogram? Yes   Up to date of Bone Density/Dexa? Yes Colorectal: Pt is UTD on colonoscopy and due again  2023  Additional Screenings:  Hepatitis C Screening:  Patient does not fall within the guidelines for this     Plan:     I have personally reviewed and noted the following in the patient's chart:   . Medical and social history . Use of alcohol, tobacco or illicit drugs  . Current medications and supplements . Functional ability and status . Nutritional status . Physical activity . Advanced directives . List of other physicians . Hospitalizations, surgeries, and ER visits in previous 12 months . Vitals . Screenings to include cognitive, depression, and falls . Referrals and appointments  In addition, I have reviewed and discussed with patient certain preventive protocols, quality metrics, and best practice recommendations. A written personalized care plan for preventive services as well as general preventive health recommendations were provided to patient.     Cleda Daub, LPN  10/08/1094

## 2018-06-08 ENCOUNTER — Other Ambulatory Visit: Payer: Self-pay | Admitting: Nurse Practitioner

## 2018-06-08 NOTE — Telephone Encounter (Signed)
Last lipid 12/02/16  MMM

## 2018-06-28 ENCOUNTER — Encounter: Payer: Self-pay | Admitting: Nurse Practitioner

## 2018-07-03 ENCOUNTER — Ambulatory Visit (INDEPENDENT_AMBULATORY_CARE_PROVIDER_SITE_OTHER): Payer: Medicare Other | Admitting: Family Medicine

## 2018-07-03 ENCOUNTER — Encounter: Payer: Self-pay | Admitting: Family Medicine

## 2018-07-03 VITALS — BP 109/71 | HR 83 | Temp 97.5°F | Ht 64.0 in | Wt 191.0 lb

## 2018-07-03 DIAGNOSIS — N3281 Overactive bladder: Secondary | ICD-10-CM | POA: Insufficient documentation

## 2018-07-03 DIAGNOSIS — R351 Nocturia: Secondary | ICD-10-CM | POA: Diagnosis not present

## 2018-07-03 DIAGNOSIS — Z23 Encounter for immunization: Secondary | ICD-10-CM

## 2018-07-03 LAB — URINALYSIS, COMPLETE
Bilirubin, UA: NEGATIVE
Glucose, UA: NEGATIVE
LEUKOCYTES UA: NEGATIVE
NITRITE UA: NEGATIVE
PH UA: 5.5 (ref 5.0–7.5)
SPEC GRAV UA: 1.025 (ref 1.005–1.030)
Urobilinogen, Ur: 0.2 mg/dL (ref 0.2–1.0)

## 2018-07-03 LAB — MICROSCOPIC EXAMINATION: BACTERIA UA: NONE SEEN

## 2018-07-03 MED ORDER — OXYBUTYNIN CHLORIDE ER 5 MG PO TB24: 5.0000 mg | ORAL_TABLET | Freq: Every day | ORAL | 3 refills | Status: DC

## 2018-07-03 NOTE — Patient Instructions (Signed)
Kegel Exercises Kegel exercises help strengthen the muscles that support the rectum, vagina, small intestine, bladder, and uterus. Doing Kegel exercises can help:  Improve bladder and bowel control.  Improve sexual response.  Reduce problems and discomfort during pregnancy.  Kegel exercises involve squeezing your pelvic floor muscles, which are the same muscles you squeeze when you try to stop the flow of urine. The exercises can be done while sitting, standing, or lying down, but it is best to vary your position. Phase 1 exercises 1. Squeeze your pelvic floor muscles tight. You should feel a tight lift in your rectal area. If you are a female, you should also feel a tightness in your vaginal area. Keep your stomach, buttocks, and legs relaxed. 2. Hold the muscles tight for up to 10 seconds. 3. Relax your muscles. Repeat this exercise 50 times a day or as many times as told by your health care provider. Continue to do this exercise for at least 4-6 weeks or for as long as told by your health care provider. This information is not intended to replace advice given to you by your health care provider. Make sure you discuss any questions you have with your health care provider. Document Released: 09/05/2012 Document Revised: 05/14/2016 Document Reviewed: 08/09/2015 Elsevier Interactive Patient Education  2018 [employer]  Overactive Bladder, Adult Overactive bladder is a group of urinary symptoms. With overactive bladder, you may suddenly feel the need to pass urine (urinate) right away. After feeling this sudden urge, you might also leak urine if you cannot get to the bathroom fast enough (urinary incontinence). These symptoms might interfere with your daily work or social activities. Overactive bladder symptoms may also wake you up at night. Overactive bladder affects the nerve signals between your bladder and your brain. Your bladder may get the signal to empty before it is full. Very  sensitive muscles can also make your bladder squeeze too soon. What are the causes? Many things can cause an overactive bladder. Possible causes include:  Urinary tract infection.  Infection of nearby tissues, such as the prostate.  Prostate enlargement.  Being pregnant with twins or more (multiples).  Surgery on the uterus or urethra.  Bladder stones, inflammation, or tumors.  Drinking too much caffeine or alcohol.  Certain medicines, especially those that you take to help your body get rid of extra fluid (diuretics) by increasing urine production.  Muscle or nerve weakness, especially from: ? A spinal cord injury. ? Stroke. ? Multiple sclerosis. ? Parkinson disease.  Diabetes. This can cause a high urine volume that fills the bladder so quickly that the normal urge to urinate is triggered very strongly.  Constipation. A buildup of too much stool can put pressure on your bladder.  What increases the risk? You may be at greater risk for overactive bladder if you:  Are an older adult.  Smoke.  Are going through menopause.  Have prostate problems.  Have a neurological disease, such as stroke, dementia, Parkinson disease, or multiple sclerosis (MS).  Eat or drink things that irritate the bladder. These include alcohol, spicy food, and caffeine.  Are overweight or obese.  What are the signs or symptoms? The signs and symptoms of an overactive bladder include:  Sudden, strong urges to urinate.  Leaking urine.  Urinating eight or more times per day.  Waking up to urinate two or more times per night.  How is this diagnosed? Your health care provider may suspect overactive bladder based on your symptoms. The health   care provider will do a physical exam and take your medical history. Blood or urine tests may also be done. For example, you might need to have a bladder function test to check how well you can hold your urine. You might also need to see a health care  provider who specializes in the urinary tract (urologist). How is this treated? Treatment for overactive bladder depends on the cause of your condition and whether it is mild or severe. Certain treatments can be done in your health care provider's office or clinic. You can also make lifestyle changes at home. Options include: Behavioral Treatments  Biofeedback. A specialist uses sensors to help you become aware of your body's signals.  Keeping a daily log of when you need to urinate and what happens after the urge. This may help you manage your condition.  Bladder training. This helps you learn to control the urge to urinate by following a schedule that directs you to urinate at regular intervals (timed voiding). At first, you might have to wait a few minutes after feeling the urge. In time, you should be able to schedule bathroom visits an hour or more apart.  Kegel exercises. These are exercises to strengthen the pelvic floor muscles, which support the bladder. Toning these muscles can help you control urination, even if your bladder muscles are overactive. A specialist will teach you how to do these exercises correctly. They require daily practice.  Weight loss. If you are obese or overweight, losing weight might relieve your symptoms of overactive bladder. Talk to your health care provider about losing weight and whether there is a specific program or method that would work best for you.  Diet change. This might help if constipation is making your overactive bladder worse. Your health care provider or a dietitian can explain ways to change what you eat to ease constipation. You might also need to consume less alcohol and caffeine or drink other fluids at different times of the day.  Stopping smoking.  Wearing pads to absorb leakage while you wait for other treatments to take effect. Physical Treatments  Electrical stimulation. Electrodes send gentle pulses of electricity to strengthen the  nerves or muscles that help to control the bladder. Sometimes, the electrodes are placed outside of the body. In other cases, they might be placed inside the body (implanted). This treatment can take several months to have an effect.  Supportive devices. Women may need a plastic device that fits into the vagina and supports the bladder (pessary). Medicines Several medicines can help treat overactive bladder and are usually used along with other treatments. Some are injected into the muscles involved in urination. Others come in pill form. Your health care provider may prescribe:  Antispasmodics. These medicines block the signals that the nerves send to the bladder. This keeps the bladder from releasing urine at the wrong time.  Tricyclic antidepressants. These types of antidepressants also relax bladder muscles.  Surgery  You may have a device implanted to help manage the nerve signals that indicate when you need to urinate.  You may have surgery to implant electrodes for electrical stimulation.  Sometimes, very severe cases of overactive bladder require surgery to change the shape of the bladder. Follow these instructions at home:  Take medicines only as directed by your health care provider.  Use any implants or a pessary as directed by your health care provider.  Make any diet or lifestyle changes that are recommended by your health care provider. These might   include: ? Drinking less fluid or drinking at different times of the day. If you need to urinate often during the night, you may need to stop drinking fluids early in the evening. ? Cutting down on caffeine or alcohol. Both can make an overactive bladder worse. Caffeine is found in coffee, tea, and sodas. ? Doing Kegel exercises to strengthen muscles. ? Losing weight if you need to. ? Eating a healthy and balanced diet to prevent constipation.  Keep a journal or log to track how much and when you drink and also when you feel the  need to urinate. This will help your health care provider to monitor your condition. Contact a health care provider if:  Your symptoms do not get better after treatment.  Your pain and discomfort are getting worse.  You have more frequent urges to urinate.  You have a fever. Get help right away if: You are not able to control your bladder at all. This information is not intended to replace advice given to you by your health care provider. Make sure you discuss any questions you have with your health care provider. Document Released: 07/16/2009 Document Revised: 02/25/2016 Document Reviewed: 02/12/2014 Elsevier Interactive Patient Education  2018 Elsevier Inc.  

## 2018-07-03 NOTE — Progress Notes (Signed)
Subjective:    Patient ID: Ariel [name], female    DOB: [birth year], 72 y.o.   MRN: [id number]  Chief Complaint:  Frequent urination at night only   HPI: Ariel [name] is a 72 y.o. female presenting on 07/03/2018 for Frequent urination at night only  Pt presents today for complaints of nocturia. Pt states this started several months ago and happens nightly. She states she gets up at least 6-7 times per night. She states she feels as if she is not emptying her bladder completely. She denies hematuria, dysuria, or hesitancy. She states she does have urgency. She denies daytime frequency. She states she does drink a lot of water in the evenings. Denies excessive caffeine intake. She denies abdominal pain, shortness of breath, orthopnea, cough, swelling, palpitations, chest pain, or constipation. Pt states she takes her blood pressure medications in the morning. Denies other complaints.   Relevant past medical, surgical, family and social history reviewed and updated as indicated. Interim medical history since our last visit reviewed. Allergies and medications reviewed and updated. DATA REVIEWED: CHART IN EPIC  Family History reviewed for pertinent findings.  Past Medical History:  Diagnosis Date  . Arthritis   . Diverticulosis   . Hyperlipidemia   . Hypertension   . Osteopenia     Past Surgical History:  Procedure Laterality Date  . ABDOMINAL HYSTERECTOMY  1985  . CARPAL TUNNEL RELEASE Right 2007  . CARPAL TUNNEL RELEASE Left 03/17/2016   Procedure: LEFT CARPAL TUNNEL RELEASE;  Surgeon: Carole Civil, MD;  Location: [facility];  Service: Orthopedics;  Laterality: Left;    Social History   Socioeconomic History  . Marital status: Widowed    Spouse name: Not on file  . Number of children: Not on file  . Years of education: Not on file  . Highest education level: 12th grade  Occupational History  . Occupation: Retired    Comment: Retired from the [employer]  .  Financial resource strain: Not hard at all  . Food insecurity:    Worry: Never true    Inability: Never true  . Transportation needs:    Medical: Yes    Non-medical: No  Tobacco Use  . Smoking status: Never Smoker  . Smokeless tobacco: Never Used  Substance and Sexual Activity  . Alcohol use: Yes    Comment: social  . Drug use: No  . Sexual activity: Yes    Birth control/protection: Surgical  Lifestyle  . Physical activity:    Days per week: 3 days    Minutes per session: 30 min  . Stress: Not at all  Relationships  . Social connections:    Talks on phone: More than three times a week    Gets together: More than three times a week    Attends religious service: More than 4 times per year    Active member of club or organization: No    Attends meetings of clubs or organizations: Never    Relationship status: Widowed  . Intimate partner violence:    Fear of current or ex partner: No    Emotionally abused: No    Physically abused: No    Forced sexual activity: No  Other Topics Concern  . Not on file  Social History Narrative  . Not on file    Outpatient Encounter Medications as of 07/03/2018  Medication Sig  . rosuvastatin (CRESTOR) 10 MG tablet TAKE 1 TABLET BY MOUTH EVERY DAY  . valsartan-hydrochlorothiazide (  DIOVAN-HCT) 160-25 MG tablet Take 0.5 tablets by mouth daily.  Marland Kitchen oxybutynin (DITROPAN-XL) 5 MG 24 hr tablet Take 1 tablet (5 mg total) by mouth at bedtime.   No facility-administered encounter medications on file as of 07/03/2018.     No Known Allergies  Review of Systems  Constitutional: Negative for chills, fatigue and fever.  Respiratory: Negative for cough, chest tightness, shortness of breath and wheezing.   Cardiovascular: Negative for chest pain, palpitations and leg swelling.  Gastrointestinal: Negative for abdominal distention, abdominal pain, blood in stool, constipation, diarrhea, nausea, rectal pain and vomiting.  Genitourinary: Positive for  frequency (Nocturia ) and urgency (at night). Negative for decreased urine volume, difficulty urinating, dyspareunia, dysuria, flank pain, hematuria, pelvic pain, vaginal bleeding, vaginal discharge and vaginal pain.  Neurological: Negative for dizziness and weakness.  All other systems reviewed and are negative.       Objective:    Ht 5' 4"  (1.626 m)   Wt 191 lb (86.6 kg)   BMI 32.79 kg/m    Wt Readings from Last 3 Encounters:  07/03/18 191 lb (86.6 kg)  05/11/18 191 lb (86.6 kg)  03/06/18 193 lb (87.5 kg)    Physical Exam  Constitutional: She is oriented to person, place, and time. She appears well-developed and well-nourished. No distress.  HENT:  Head: Normocephalic.  Cardiovascular: Normal rate, regular rhythm, normal heart sounds and intact distal pulses. Exam reveals no gallop and no friction rub.  No murmur heard. Pulmonary/Chest: Effort normal and breath sounds normal. No respiratory distress. She has no wheezes. She has no rales.  Abdominal: Soft. Bowel sounds are normal. She exhibits no distension and no mass. There is no tenderness. There is no rebound and no guarding.  Neurological: She is alert and oriented to person, place, and time.  Skin: Skin is warm and dry. Capillary refill takes less than 2 seconds.  Psychiatric: She has a normal mood and affect. Her speech is normal and behavior is normal. Judgment and thought content normal.  Nursing note and vitals reviewed.      Assessment & Plan:   1. Nocturia Urinalysis revealed 2+ RBC. Recheck urine in 4 weeks to see if hematuria has resolved. Will assess electrolytes and kidney function, labs pending.  Kegel exercises. Limit fluid intake in the evening. Limit caffeine intake. Medications as prescribed.  - Urinalysis, Complete - oxybutynin (DITROPAN-XL) 5 MG 24 hr tablet; Take 1 tablet (5 mg total) by mouth at bedtime.  Dispense: 30 tablet; Refill: 3 - CMP14+EGFR  2. Overactive bladder Kegel exercises. Limit  fluid intake in the evening. Limit caffeine intake. Medications as prescribed.  - oxybutynin (DITROPAN-XL) 5 MG 24 hr tablet; Take 1 tablet (5 mg total) by mouth at bedtime.  Dispense: 30 tablet; Refill: 3   Continue all other maintenance medications.  Follow up plan: Return in about 4 weeks (around 07/31/2018), or if symptoms worsen or fail to improve.  Educational handout given for kegel exercises, overactive bladder  The above assessment and management plan was discussed with the patient. The patient verbalized understanding of and has agreed to the management plan. Patient is aware to call the clinic if symptoms persist or worsen. Patient is aware when to return to the clinic for a follow-up visit. Patient educated on when it is appropriate to go to the emergency department.   Monia Pouch, FNP-C [facility] [phone]

## 2018-07-04 LAB — CMP14+EGFR
ALK PHOS: 78 IU/L (ref 39–117)
ALT: 16 IU/L (ref 0–32)
AST: 19 IU/L (ref 0–40)
Albumin/Globulin Ratio: 1.5 (ref 1.2–2.2)
Albumin: 4.1 g/dL (ref 3.5–4.8)
BUN/Creatinine Ratio: 19 (ref 12–28)
BUN: 15 mg/dL (ref 8–27)
Bilirubin Total: <0.2 mg/dL (ref 0.0–1.2)
CHLORIDE: 104 mmol/L (ref 96–106)
CO2: 22 mmol/L (ref 20–29)
Calcium: 9.9 mg/dL (ref 8.7–10.3)
Creatinine, Ser: 0.78 mg/dL (ref 0.57–1.00)
GFR calc non Af Amer: 77 mL/min/{1.73_m2} (ref >59)
GFR, EST AFRICAN AMERICAN: 88 mL/min/{1.73_m2} (ref >59)
Globulin, Total: 2.8 g/dL (ref 1.5–4.5)
Glucose: 115 mg/dL — ABNORMAL HIGH (ref 65–99)
POTASSIUM: 3.9 mmol/L (ref 3.5–5.2)
Sodium: 143 mmol/L (ref 134–144)
TOTAL PROTEIN: 6.9 g/dL (ref 6.0–8.5)

## 2018-07-07 ENCOUNTER — Other Ambulatory Visit: Payer: Self-pay | Admitting: Pediatrics

## 2018-07-09 ENCOUNTER — Other Ambulatory Visit: Payer: Self-pay | Admitting: Nurse Practitioner

## 2018-07-09 DIAGNOSIS — I10 Essential (primary) hypertension: Secondary | ICD-10-CM

## 2018-07-09 MED ORDER — ROSUVASTATIN CALCIUM 10 MG PO TABS: 10.0000 mg | ORAL_TABLET | Freq: Every day | ORAL | 0 refills | Status: DC

## 2018-07-09 NOTE — Telephone Encounter (Signed)
Last lipid 12/02/16

## 2018-07-09 NOTE — Telephone Encounter (Signed)
30 day supply then NTBS

## 2018-07-10 NOTE — Telephone Encounter (Signed)
Left detailed message.   

## 2018-07-31 ENCOUNTER — Encounter: Payer: Self-pay | Admitting: Nurse Practitioner

## 2018-07-31 ENCOUNTER — Ambulatory Visit (INDEPENDENT_AMBULATORY_CARE_PROVIDER_SITE_OTHER): Payer: Medicare Other | Admitting: Nurse Practitioner

## 2018-07-31 VITALS — BP 102/66 | HR 78 | Temp 97.0°F | Ht 64.0 in | Wt 188.0 lb

## 2018-07-31 DIAGNOSIS — I10 Essential (primary) hypertension: Secondary | ICD-10-CM | POA: Diagnosis not present

## 2018-07-31 DIAGNOSIS — E6609 Other obesity due to excess calories: Secondary | ICD-10-CM

## 2018-07-31 DIAGNOSIS — E7800 Pure hypercholesterolemia, unspecified: Secondary | ICD-10-CM | POA: Diagnosis not present

## 2018-07-31 DIAGNOSIS — R350 Frequency of micturition: Secondary | ICD-10-CM | POA: Diagnosis not present

## 2018-07-31 DIAGNOSIS — N3281 Overactive bladder: Secondary | ICD-10-CM

## 2018-07-31 DIAGNOSIS — Z6833 Body mass index (BMI) 33.0-33.9, adult: Secondary | ICD-10-CM

## 2018-07-31 DIAGNOSIS — E8881 Metabolic syndrome: Secondary | ICD-10-CM

## 2018-07-31 LAB — MICROSCOPIC EXAMINATION: BACTERIA UA: NONE SEEN

## 2018-07-31 LAB — URINALYSIS, COMPLETE
Bilirubin, UA: NEGATIVE
Glucose, UA: NEGATIVE
KETONES UA: NEGATIVE
Leukocytes, UA: NEGATIVE
Nitrite, UA: NEGATIVE
Protein, UA: NEGATIVE
SPEC GRAV UA: 1.02 (ref 1.005–1.030)
Urobilinogen, Ur: 0.2 mg/dL (ref 0.2–1.0)
pH, UA: 5.5 (ref 5.0–7.5)

## 2018-07-31 LAB — BAYER DCA HB A1C WAIVED: HB A1C (BAYER DCA - WAIVED): 6.2 % (ref <7.0)

## 2018-07-31 MED ORDER — ROSUVASTATIN CALCIUM 10 MG PO TABS: 10.0000 mg | ORAL_TABLET | Freq: Every day | ORAL | 1 refills | Status: DC

## 2018-07-31 NOTE — Progress Notes (Signed)
Subjective:    Patient ID: Ariel [name], female    DOB: Aug 02, 1946, 72 y.o.   MRN: [id number]   Chief Complaint: Medical Management of Chronic issues  HPI:  1. Frequent urination  Was seen recently for frequency and nocturia. Saw Rakes, NP and was started on ditroban. It has helped. Not going as frequently as she was.  2. Essential hypertension  No chest pain, sob or headache. Doe snot check blood pressures at home. BP Readings from Last 3 Encounters:  07/31/18 102/66  07/03/18 109/71  05/11/18 106/69     3. Pure hypercholesterolemia  Avoids fatty foods. Tries to walk 2-3 times a week.  4. Class 1 obesity due to excess calories with serious comorbidity and body mass index (BMI) of 33.0 to 33.9 in adult  Weight is down 3 lbs  5. Metabolic syndrome  Last ZMCE0E was 6.0. She does not check blood sugars at home.  6. Overactive bladder  As stated earlier she was put on ditropan XL which has helped- she denies any side effects.    Outpatient Encounter Medications as of 07/31/2018  Medication Sig  . oxybutynin (DITROPAN-XL) 5 MG 24 hr tablet Take 1 tablet (5 mg total) by mouth at bedtime.  . rosuvastatin (CRESTOR) 10 MG tablet Take 1 tablet (10 mg total) by mouth daily.  . valsartan-hydrochlorothiazide (DIOVAN-HCT) 160-25 MG tablet TAKE 1/2 TABLET BY MOUTH DAILY.      New complaints: None today  Social history: Retired from hospital.   Review of Systems  Constitutional: Negative for activity change and appetite change.  HENT: Negative.   Eyes: Negative for pain.  Respiratory: Negative for shortness of breath.   Cardiovascular: Negative for chest pain, palpitations and leg swelling.  Gastrointestinal: Negative for abdominal pain.  Endocrine: Negative for polydipsia.  Genitourinary: Negative.   Skin: Negative for rash.  Neurological: Negative for dizziness, weakness and headaches.  Hematological: Does not bruise/bleed easily.  Psychiatric/Behavioral: Negative.   All  other systems reviewed and are negative.      Objective:   Physical Exam  Constitutional: She is oriented to person, place, and time. She appears well-developed and well-nourished. No distress.  HENT:  Head: Normocephalic.  Nose: Nose normal.  Mouth/Throat: Oropharynx is clear and moist.  Eyes: Pupils are equal, round, and reactive to light. EOM are normal.  Neck: Normal range of motion. Neck supple. No JVD present. Carotid bruit is not present.  Cardiovascular: Normal rate, regular rhythm, normal heart sounds and intact distal pulses.  Pulmonary/Chest: Effort normal and breath sounds normal. No respiratory distress. She has no wheezes. She has no rales. She exhibits no tenderness.  Abdominal: Soft. Normal appearance, normal aorta and bowel sounds are normal. She exhibits no distension, no abdominal bruit, no pulsatile midline mass and no mass. There is no splenomegaly or hepatomegaly. There is no tenderness.  Musculoskeletal: Normal range of motion. She exhibits no edema.  Lymphadenopathy:    She has no cervical adenopathy.  Neurological: She is alert and oriented to person, place, and time. She has normal reflexes.  Skin: Skin is warm and dry.  Psychiatric: She has a normal mood and affect. Her behavior is normal. Judgment and thought content normal.  Nursing note and vitals reviewed.   BP 102/66   Pulse 78   Temp (!) 97 F (36.1 C) (Oral)   Ht 5' 4"  (1.626 m)   Wt 188 lb (85.3 kg)   BMI 32.27 kg/m   HGBA1c 6.0%    Assessment &  Plan:  Ariel [name] comes in today with chief complaint of Medical Management of Chronic Issues   Diagnosis and orders addressed:  1. Frequent urination Continue to force fluids - Urinalysis, Complete  2. Essential hypertension Low sodium diet - CMP14+EGFR  3. Pure hypercholesterolemia Low fat diet - Lipid panel - rosuvastatin (CRESTOR) 10 MG tablet; Take 1 tablet (10 mg total) by mouth daily.  Dispense: 90 tablet; Refill: 1  4. Class  1 obesity due to excess calories with serious comorbidity and body mass index (BMI) of 33.0 to 33.9 in adult Discussed diet and exercise for person with BMI >25 Will recheck weight in 3-6 months  5. Metabolic syndrome Watch carbs in diet No meds needed at this tme - Bayer DCA Hb A1c Waived  6. Overactive bladder Continue detrol LA as rx   Labs pending Health Maintenance reviewed Diet and exercise encouraged  Follow up plan: 6 months   Brock, FNP

## 2018-07-31 NOTE — Patient Instructions (Signed)

## 2018-08-01 ENCOUNTER — Other Ambulatory Visit: Payer: Self-pay | Admitting: Nurse Practitioner

## 2018-08-01 LAB — LIPID PANEL
CHOL/HDL RATIO: 3.1 ratio (ref 0.0–4.4)
CHOLESTEROL TOTAL: 140 mg/dL (ref 100–199)
HDL: 45 mg/dL (ref >39)
LDL CALC: 67 mg/dL (ref 0–99)
TRIGLYCERIDES: 139 mg/dL (ref 0–149)
VLDL Cholesterol Cal: 28 mg/dL (ref 5–40)

## 2018-08-01 LAB — CMP14+EGFR
ALK PHOS: 84 IU/L (ref 39–117)
ALT: 17 IU/L (ref 0–32)
AST: 22 IU/L (ref 0–40)
Albumin/Globulin Ratio: 1.5 (ref 1.2–2.2)
Albumin: 4.3 g/dL (ref 3.5–4.8)
BUN/Creatinine Ratio: 21 (ref 12–28)
BUN: 15 mg/dL (ref 8–27)
Bilirubin Total: <0.2 mg/dL (ref 0.0–1.2)
CO2: 24 mmol/L (ref 20–29)
CREATININE: 0.72 mg/dL (ref 0.57–1.00)
Calcium: 10.1 mg/dL (ref 8.7–10.3)
Chloride: 103 mmol/L (ref 96–106)
GFR calc Af Amer: 97 mL/min/{1.73_m2} (ref >59)
GFR, EST NON AFRICAN AMERICAN: 85 mL/min/{1.73_m2} (ref >59)
Globulin, Total: 2.9 g/dL (ref 1.5–4.5)
Glucose: 99 mg/dL (ref 65–99)
POTASSIUM: 3.8 mmol/L (ref 3.5–5.2)
SODIUM: 141 mmol/L (ref 134–144)
TOTAL PROTEIN: 7.2 g/dL (ref 6.0–8.5)

## 2018-08-08 ENCOUNTER — Other Ambulatory Visit: Payer: Self-pay | Admitting: Nurse Practitioner

## 2018-10-26 ENCOUNTER — Ambulatory Visit (INDEPENDENT_AMBULATORY_CARE_PROVIDER_SITE_OTHER): Payer: Medicare Other | Admitting: Orthopedic Surgery

## 2018-10-26 ENCOUNTER — Encounter: Payer: Self-pay | Admitting: Orthopedic Surgery

## 2018-10-26 ENCOUNTER — Ambulatory Visit (INDEPENDENT_AMBULATORY_CARE_PROVIDER_SITE_OTHER): Payer: Medicare Other

## 2018-10-26 VITALS — BP 117/74 | HR 80 | Ht 64.0 in | Wt 185.0 lb

## 2018-10-26 DIAGNOSIS — M25512 Pain in left shoulder: Secondary | ICD-10-CM

## 2018-10-26 DIAGNOSIS — G8929 Other chronic pain: Secondary | ICD-10-CM

## 2018-10-26 DIAGNOSIS — M7711 Lateral epicondylitis, right elbow: Secondary | ICD-10-CM | POA: Diagnosis not present

## 2018-10-26 DIAGNOSIS — M1812 Unilateral primary osteoarthritis of first carpometacarpal joint, left hand: Secondary | ICD-10-CM | POA: Diagnosis not present

## 2018-10-26 DIAGNOSIS — M67819 Other specified disorders of synovium and tendon, unspecified shoulder: Secondary | ICD-10-CM | POA: Diagnosis not present

## 2018-10-26 MED ORDER — MELOXICAM 7.5 MG PO TABS: 7.5000 mg | ORAL_TABLET | Freq: Every day | ORAL | 0 refills | Status: DC

## 2018-10-26 NOTE — Patient Instructions (Addendum)
Stop ibuprofen start meloxicam   Start physical therapy for the shoulder   You have received an injection of steroids into the joint. 15% of patients will have increased pain within the 24 hours postinjection.   This is transient and will go away.   We recommend that you use ice packs on the injection site for 20 minutes every 2 hours and extra strength Tylenol 2 tablets every 8 as needed until the pain resolves.  If you continue to have pain after taking the Tylenol and using the ice please call the office for further instructions.   Wear elbow and thumb brace 16 hrs per day     Tennis Elbow  Tennis elbow (lateral epicondylitis) is inflammation of tendons in your outer forearm, near your elbow. Tendons are tissues that connect muscle to bone. When you have tennis elbow, inflammation affects the tendons that you use to bend your wrist and move your hand up. Inflammation occurs in the lower part of the upper arm bone (humerus), where the tendons connect to the bone (lateral epicondyle). Tennis elbow often affects people who play tennis, but anyone may get the condition from repeatedly extending the wrist or turning the forearm. What are the causes? This condition is usually caused by repeatedly extending the wrist, turning the forearm, and using the hands. It can result from sports or work that requires repetitive forearm movements. In some cases, it may be caused by a sudden injury. What increases the risk? You are more likely to develop tennis elbow if you play tennis or another racket sport. You also have a higher risk if you frequently use your hands for work. Besides people who play tennis, others at greater risk include:  Musicians.  Carpenters, painters, and plumbers.  Cooks.  Cashiers.  People who work in [employer].  Holiday representative workers.  Butchers.  People who use computers. What are the signs or symptoms? Symptoms of this condition include:  Pain and tenderness in  the forearm and the outer part of the elbow. Pain may be felt only when using the arm, or it may be there all the time.  A burning feeling that starts in the elbow and spreads down the forearm.  A weak grip in the hand. How is this diagnosed? This condition may be diagnosed based on:  Your symptoms and medical history.  A physical exam.  X-rays.  MRI. How is this treated? Resting and icing your arm is often the first treatment. Your health care provider may also recommend:  Medicines to reduce pain and inflammation. These may be in the form of a pill, topical gels, or shots of a steroid medicine (cortisone).  An elbow strap to reduce stress on the area.  Physical therapy. This may include massage or exercises.  An elbow brace to restrict the movements that cause symptoms. If these treatments do not help relieve your symptoms, your health care provider may recommend surgery to remove damaged muscle and reattach healthy muscle to bone. Follow these instructions at home: Activity  Rest your elbow and wrist and avoid activities that cause symptoms, as told by your health care provider.  Do physical therapy exercises as instructed.  If you lift an object, lift it with your palm facing up. This reduces stress on your elbow. Lifestyle  If your tennis elbow is caused by sports, check your equipment and make sure that: ? You are using it correctly. ? It is the best fit for you.  If your tennis elbow is  caused by work or computer use, take frequent breaks to stretch your arm. Talk with your manager about ways to manage your condition at work. If you have a brace:  Wear the brace or strap as told by your health care provider. Remove it only as told by your health care provider.  Loosen the brace if your fingers tingle, become numb, or turn cold and blue.  Keep the brace clean.  If the brace is not waterproof, ask if you may remove it for bathing. If you must keep the brace on  while bathing: ? Do not let it get wet. ? Cover it with a watertight covering when you take a bath or a shower. General instructions   If directed, put ice on the painful area: ? Put ice in a plastic bag. ? Place a towel between your skin and the bag. ? Leave the ice on for 20 minutes, 2-3 times a day.  Take over-the-counter and prescription medicines only as told by your health care provider.  Keep all follow-up visits as told by your health care provider. This is important. Contact a health care provider if:  You have pain that gets worse or does not get better with treatment.  You have numbness or weakness in your forearm, hand, or fingers. Summary  Tennis elbow (lateral epicondylitis) is inflammation of tendons in your outer forearm, near your elbow.  Common symptoms include pain and tenderness in your forearm and the outer part of your elbow.  This condition is usually caused by repeatedly extending your wrist, turning your forearm, and using your hands.  The first treatment is often resting and icing your arm to relieve symptoms. Further treatment may include taking medicine, getting physical therapy, wearing a brace or strap, or having surgery. This information is not intended to replace advice given to you by your health care provider. Make sure you discuss any questions you have with your health care provider. Document Released: 09/19/2005 Document Revised: 07/04/2017 Document Reviewed: 07/04/2017 Elsevier Interactive Patient Education  2019 [employer]  Rotator Cuff Tendinitis  Rotator cuff tendinitis is inflammation of the tough, cord-like bands that connect muscle to bone (tendons) in the rotator cuff. The rotator cuff includes all of the muscles and tendons that connect the arm to the shoulder. The rotator cuff holds the head of the upper arm bone (humerus) in the cup (fossa) of the shoulder blade (scapula). This condition can lead to a long-lasting (chronic) tear.  The tear may be partial or complete. What are the causes? This condition is usually caused by overusing the rotator cuff. What increases the risk? This condition is more likely to develop in athletes and workers who frequently use their shoulder or reach over their heads. This can include activities such as:  Tennis.  Baseball or softball.  Swimming.  Construction work.  Painting. What are the signs or symptoms? Symptoms of this condition include:  Pain spreading (radiating) from the shoulder to the upper arm.  Swelling and tenderness in front of the shoulder.  Pain when reaching, pulling, or lifting the arm above the head.  Pain when lowering the arm from above the head.  Minor pain in the shoulder when resting.  Increased pain in the shoulder at night.  Difficulty placing the arm behind the back. How is this diagnosed? This condition is diagnosed with a medical history and physical exam. Tests may also be done, including:  X-rays.  MRI.  Ultrasounds.  CT or MR arthrogram. During this  test, a contrast material is injected and then images are taken. How is this treated? Treatment for this condition depends on the severity of the condition. In less severe cases, treatment may include:  Rest. This may be done with a sling that holds the shoulder still (immobilization). Your health care provider may also recommend avoiding activities that involve lifting your arm over your head.  Icing the shoulder.  Anti-inflammatory medicines, such as aspirin or ibuprofen. In more severe cases, treatment may include:  Physical therapy.  Steroid injections.  Surgery. Follow these instructions at home: If you have a sling:  Wear the sling as told by your health care provider. Remove it only as told by your health care provider.  Loosen the sling if your fingers tingle, become numb, or turn cold and blue.  Keep the sling clean.  If the sling is not waterproof, do not let  it get wet. Remove it, if allowed, or cover it with a watertight covering when you take a bath or shower. Managing pain, stiffness, and swelling  If directed, put ice on the injured area. ? If you have a removable sling, remove it as told by your health care provider. ? Put ice in a plastic bag. ? Place a towel between your skin and the bag. ? Leave the ice on for 20 minutes, 2-3 times a day.  Move your fingers often to avoid stiffness and to lessen swelling.  Raise (elevate) the injured area above the level of your heart while you are lying down.  Find a comfortable sleeping position or sleep on a recliner, if available. Driving  Do not drive or use heavy machinery while taking prescription pain medicine.  Ask your health care provider when it is safe to drive if you have a sling on your arm. Activity  Rest your shoulder as told by your health care provider.  Return to your normal activities as told by your health care provider. Ask your health care provider what activities are safe for you.  Do any exercises or stretches as told by your health care provider.  If you do repetitive overhead tasks, take small breaks in between and include stretching exercises as told by your health care provider. General instructions  Do not use any products that contain nicotine or tobacco, such as cigarettes and e-cigarettes. These can delay healing. If you need help quitting, ask your health care provider.  Take over-the-counter and prescription medicines only as told by your health care provider.  Keep all follow-up visits as told by your health care provider. This is important. Contact a health care provider if:  Your pain gets worse.  You have new pain in your arm, hands, or fingers.  Your pain is not relieved with medicine or does not get better after 6 weeks of treatment.  You have cracking sensations when moving your shoulder in certain directions.  You hear a snapping sound after  using your shoulder, followed by severe pain and weakness. Get help right away if:  Your arm, hand, or fingers are numb or tingling.  Your arm, hand, or fingers are swollen or painful or they turn white or blue. Summary  Rotator cuff tendinitis is inflammation of the tough, cord-like bands that connect muscle to bone (tendons) in the rotator cuff.  This condition is usually caused by overusing the rotator cuff, which includes all of the muscles and tendons that connect the arm to the shoulder.  This condition is more likely to develop in  athletes and workers who frequently use their shoulder or reach over their heads.  Treatment generally includes rest, anti-inflammatory medicines, and icing. In some cases, physical therapy and steroid injections may be needed. In severe cases, surgery may be needed. This information is not intended to replace advice given to you by your health care provider. Make sure you discuss any questions you have with your health care provider. Document Released: 12/10/2003 Document Revised: 09/05/2016 Document Reviewed: 09/05/2016 Elsevier Interactive Patient Education  2019 [employer].

## 2018-10-26 NOTE — Progress Notes (Addendum)
NEW Problem//OFFICE VISIT  Chief Complaint  Patient presents with  . Wrist Pain    Left CT DOS 03/17/16    73 year old female had a left carpal tunnel release in 2017 comes in today complaining of left shoulder pain right forearm pain and left thumb pain  Left shoulder pain since 2017 actually had MRI and x-ray back in 2016 showing partial rotator cuff tears tendinitis tendinosis AC joint arthritis.  She complains of the hand going numb intermittently involving all the fingers except the thumb she complains of loss of motion in the shoulder especially with forward elevation and abduction with pain along the anterior joint line rated 6-7 out of 10 unrelieved by Motrin  Right arm stiffness pain in the forearm tenderness over the right elbow for 2 to 3 months with no relief with Motrin as well.  Right thumb is sore and discomfort is noted especially with fine motor activity   Review of Systems  Constitutional: Negative for fever.  Respiratory: Negative.   Cardiovascular: Negative.   Gastrointestinal: Negative.   Genitourinary: Negative.   Musculoskeletal: Negative for neck pain.  Neurological: Positive for sensory change.     Past Medical History:  Diagnosis Date  . Arthritis   . Diverticulosis   . Hyperlipidemia   . Hypertension   . Osteopenia     Past Surgical History:  Procedure Laterality Date  . ABDOMINAL HYSTERECTOMY  1985  . CARPAL TUNNEL RELEASE Right 2007  . CARPAL TUNNEL RELEASE Left 03/17/2016   Procedure: LEFT CARPAL TUNNEL RELEASE;  Surgeon: Vickki Hearing, MD;  Location: [facility];  Service: Orthopedics;  Laterality: Left;    Family History  Problem Relation Age of Onset  . Diabetes Mother   . Cancer Father 39       colon  . Alcohol abuse Brother   . Cancer Brother        stomach  . Lupus Brother    Social History   Tobacco Use  . Smoking status: Never Smoker  . Smokeless tobacco: Never Used  Substance Use Topics  . Alcohol use: Yes   Comment: social  . Drug use: No    No Known Allergies  Current Meds  Medication Sig  . ibuprofen (ADVIL,MOTRIN) 800 MG tablet TAKE 1 TABLET BY MOUTH EVERY 8 HOURS AS NEEDED  . rosuvastatin (CRESTOR) 10 MG tablet Take 1 tablet (10 mg total) by mouth daily.  . valsartan-hydrochlorothiazide (DIOVAN-HCT) 160-25 MG tablet TAKE 1/2 TABLET BY MOUTH DAILY.    BP 117/74   Pulse 80   Ht 5\' 4"  (1.626 m)   Wt 185 lb (83.9 kg)   BMI 31.76 kg/m   Physical Exam Vitals signs reviewed.  Constitutional:      Appearance: She is well-developed.  Neurological:     Mental Status: She is alert and oriented to person, place, and time.  Psychiatric:        Attention and Perception: Attention normal.        Mood and Affect: Mood and affect normal.        Speech: Speech normal.        Behavior: Behavior normal.        Thought Content: Thought content normal.        Judgment: Judgment normal.     Ortho Exam  Right arm full range of motion elbow ligaments are stable tenderness over the lateral epicondyle painful wrist extension against resistance normal sensation pulse perfusion without edema lymph nodes are negative grip strength is  equal right to left with slight weakness in each  Left shoulder alignment is normal there is tenderness in the peri-acromial region full passive range of motion with painful range of motion from 90 degrees up to 150 degrees positive impingement sign mild weakness in the supraspinatus tendon normal internal/external rotation strength no strength instability is noted again there is a positive impingement sign neurovascular exam is normal except for the residual mild carpal tunnel pulse perfusion normal lymph nodes are negative  MEDICAL DECISION SECTION  Prior x-rays are reviewed first x-rays from [facility] its in our PACS system is from September 2015 there is notable arthritis at the inferior margin of the humeral head but normal joint space in the glenohumeral  joint there is also no evidence of avascular necrosis  MRI of the shoulder noted October 02, 2015 was read as significant rotator cuff tendinopathy tendinosis interstitial tear shallow articular surface tear calcific tendinitis AC joint degenerative changes inferior spurring distal clavicle and acromion tendinopathy and split tear of the biceps tendon advanced glenohumeral joint arthritis  My independent reading of xrays:  Plain film of the shoulder from September 2015 glenohumeral arthritis with inferior humeral head spur no evidence of avascular necrosis normal glenohumeral joint space  Encounter Diagnoses  Name Primary?  . Chronic left shoulder pain   . Right tennis elbow   . Arthritis of carpometacarpal (CMC) joint of left thumb   . Tendinosis of rotator cuff left Yes    PLAN: (Rx., injectx, surgery, frx, mri/ct) Her shoulder arthritis is worse.  I gave her a subacromial injection and sent her for physical therapy  I injected the right tennis elbow and put her in a tennis elbow brace  I splinted the left thumb  I started her on meloxicam we stop the ibuprofen  Follow-up in 6 weeks  No orders of the defined types were placed in this encounter. Procedure note injection for right tennis elbow  Diagnosis right tennis elbow  Anesthesia ethyl chloride was used Alcohol use is clean the skin  After we obtained verbal consent and timeout a 25-gauge needle was used to inject 40 mg of Depo-Medrol and 3 cc of 1% lidocaine just distal to the insertion of the ECRB RT ELBOW  There were no complications and a sterile bandage was applied.   Fuller CanadaStanley Jaiveon Suppes, MD  10/26/2018 10:10 AM

## 2018-10-26 NOTE — Addendum Note (Signed)
Addended byCaffie Damme on: 10/26/2018 11:03 AM   Modules accepted: Orders

## 2018-10-31 ENCOUNTER — Ambulatory Visit: Payer: Medicare Other | Admitting: Physical Therapy

## 2018-11-07 ENCOUNTER — Ambulatory Visit: Payer: Medicare Other | Admitting: Physical Therapy

## 2018-11-17 ENCOUNTER — Other Ambulatory Visit: Payer: Self-pay | Admitting: Orthopedic Surgery

## 2018-11-17 DIAGNOSIS — M25512 Pain in left shoulder: Principal | ICD-10-CM

## 2018-11-17 DIAGNOSIS — G8929 Other chronic pain: Secondary | ICD-10-CM

## 2018-11-17 DIAGNOSIS — M1812 Unilateral primary osteoarthritis of first carpometacarpal joint, left hand: Secondary | ICD-10-CM

## 2018-11-17 DIAGNOSIS — M67819 Other specified disorders of synovium and tendon, unspecified shoulder: Secondary | ICD-10-CM

## 2018-11-17 DIAGNOSIS — M7711 Lateral epicondylitis, right elbow: Secondary | ICD-10-CM

## 2018-12-10 ENCOUNTER — Ambulatory Visit: Payer: Medicare Other | Admitting: Nurse Practitioner

## 2018-12-14 ENCOUNTER — Ambulatory Visit: Payer: Medicare Other | Admitting: Orthopedic Surgery

## 2018-12-24 ENCOUNTER — Ambulatory Visit: Payer: Medicare Other | Admitting: Orthopedic Surgery

## 2019-01-14 ENCOUNTER — Encounter: Payer: Self-pay | Admitting: Orthopedic Surgery

## 2019-01-14 ENCOUNTER — Ambulatory Visit (INDEPENDENT_AMBULATORY_CARE_PROVIDER_SITE_OTHER): Payer: Medicare Other | Admitting: Orthopedic Surgery

## 2019-01-14 ENCOUNTER — Other Ambulatory Visit: Payer: Self-pay

## 2019-01-14 DIAGNOSIS — G5601 Carpal tunnel syndrome, right upper limb: Secondary | ICD-10-CM | POA: Diagnosis not present

## 2019-01-14 DIAGNOSIS — M75102 Unspecified rotator cuff tear or rupture of left shoulder, not specified as traumatic: Secondary | ICD-10-CM

## 2019-01-14 DIAGNOSIS — M7711 Lateral epicondylitis, right elbow: Secondary | ICD-10-CM

## 2019-01-14 DIAGNOSIS — M25512 Pain in left shoulder: Secondary | ICD-10-CM | POA: Diagnosis not present

## 2019-01-14 DIAGNOSIS — M12812 Other specific arthropathies, not elsewhere classified, left shoulder: Secondary | ICD-10-CM

## 2019-01-14 DIAGNOSIS — G8929 Other chronic pain: Secondary | ICD-10-CM

## 2019-01-14 MED ORDER — HYDROCODONE-ACETAMINOPHEN 5-325 MG PO TABS: 1.0000 | ORAL_TABLET | Freq: Two times a day (BID) | ORAL | 0 refills | Status: AC | PRN

## 2019-01-14 NOTE — Progress Notes (Signed)
I connected with Ariel Birchwoodosetta [name] on 01/14/19 at  1:30 PM EDT by telephone and verified that I am speaking with the correct person using two identifiers.   I discussed the limitations, risks, security and privacy concerns of performing an evaluation and management service by telephone and the availability of in person appointments. I also discussed with the patient that there may be a patient responsible charge related to this service. The patient expressed understanding and agreed to proceed.   Progress Note   Patient ID: Ariel [name], female   DOB: 08/09/1946, 73 y.o.   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  We have also treated her for tennis elbow with injection and bracing which is helped and that has pretty much resolved  Back in 2016 she had an MRI and x-rays of the left shoulder which showed a partial rotator cuff tear with osteoarthritis of the left shoulder.  She has not noted improvement regarding that with medication and injection she still has significant pain at night and inability to fully raise her arm  She took Advil Aleve and meloxicam  She took a pain pill that 1 of her friends had 7.5 mg of hydrocodone and she was able to sleep      Review of Systems  Constitutional: Negative for chills and fever.  Skin: Negative for rash.  Neurological: Positive for tingling, sensory change and weakness.   Past Medical History:  Diagnosis Date  . Arthritis   . Diverticulosis   . Hyperlipidemia   . Hypertension   . Osteopenia      No Known Allergies   There were no  vitals taken for this visit.  Physical Exam   Medical decisions:   Data  Imaging:   CLINICAL DATA:  Left shoulder pain and decreased range of motion for the past few months. No known injury.   EXAM: MRI OF THE LEFT SHOULDER WITHOUT CONTRAST   TECHNIQUE: Multiplanar, multisequence MR imaging of the shoulder was performed. No intravenous contrast was administered.   COMPARISON:  None.   FINDINGS: Rotator cuff: Significant rotator cuff tendinopathy/tendinosis with evidence of calcific tendinitis at the supraspinatus infraspinatus junction region. No partial or full-thickness tear.   Muscles:  Normal.   Biceps long head: Intact. Moderate tendinopathy involving the intra-articular portion and possible short segment longitudinal split type tear.   Acromioclavicular Joint: Advanced degenerative changes. The acromion is type 1- 2 in shape. No significant lateral downsloping. There is mild undersurface spurring.   Glenohumeral Joint: Moderate to advanced degenerative changes with joint space narrowing, cartilage loss, osteophytic spurring and subchondral cystic change. There is also small joint effusion and mild synovitis.   Labrum:  Advanced labral degenerative changes.   Bones: No acute bony findings. Subchondral cystic changes noted near the greater tuberosity.   IMPRESSION: 1. Significant rotator cuff tendinopathy/tendinosis with interstitial tears and probable shallow articular surface tears. There is also calcific tendinitis noted at the infraspinatus supraspinatus junction area. 2. AC joint degenerative changes and inferior spurring from the distal clavicle and acromion may contribute to bony impingement. 3. Significant tendinopathy involving the long head biceps tendon with suspected short segment longitudinal  split type tear. 4. Advanced glenohumeral joint degenerative changes.     Electronically Signed   By: Rudie Meyer M.D.   On: 10/02/2015  13:05  X-ray report   [facility] lateral left shoulder   Shoulder pain for over 4 years now   X-rays were done about 3 to 4 years ago there noted for comparison   There is been progressive increase in the amount of glenohumeral joint space narrowing enlarging osteophyte at the inferior portion of the humeral head and glenoid   There is a fairly large osteophyte in the inferior edge of the acromion and narrowing of the acromioclavicular joint   Impression AC joint arthritis moderate to severe   Glenohumeral arthritis mild to moderate   I reviewed the x-ray again and the MRI again my interpretation is that she has osteoarthritis of the shoulder with a significant inferior glenohumeral osteophyte joint space narrowing is mild to moderate  On MRI she has a partially torn rotator cuff cyst formation in the greater tuberosity and humeral head with a arthritic acromioclavicular joint   Encounter Diagnoses  Name Primary?  . Chronic left shoulder pain   . Right tennis elbow   . Carpal tunnel syndrome of right wrist   . 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  Right tennis elbow resolved  3.  Right carpal tunnel syndrome repeat carpal tunnel testing with nerve conduction study  Hydrocodone 5 mg twice daily  Virtual visit in 4 weeks  Meds ordered this encounter  Medications  . HYDROcodone-acetaminophen (NORCO) 5-325 MG tablet    Sig: Take 1 tablet by mouth every 12 (twelve) hours as needed for up to 14 days for moderate pain.    Dispense:  28 tablet    Refill:  0       Fuller Canada, MD 01/14/2019 1:42 PM  Follow Up Instructions:    I discussed the assessment and treatment plan with the patient. The patient was provided an opportunity to ask questions and all were  answered. The patient agreed with the plan and demonstrated an understanding of the instructions.   Virtual visit in May 4 weeks   Meds ordered this encounter  Medications  . HYDROcodone-acetaminophen (NORCO) 5-325 MG tablet    Sig: Take 1 tablet by mouth every 12 (twelve) hours as needed for up to 14 days for moderate pain.    Dispense:  28 tablet    Refill:  0    I provided 16 sec of non-face-to-face time during this encounter.

## 2019-01-29 ENCOUNTER — Ambulatory Visit: Payer: Medicare Other | Admitting: Nurse Practitioner

## 2019-02-11 ENCOUNTER — Ambulatory Visit (INDEPENDENT_AMBULATORY_CARE_PROVIDER_SITE_OTHER): Payer: Medicare Other | Admitting: Orthopedic Surgery

## 2019-02-11 ENCOUNTER — Other Ambulatory Visit: Payer: Self-pay

## 2019-02-11 DIAGNOSIS — M12812 Other specific arthropathies, not elsewhere classified, left shoulder: Secondary | ICD-10-CM

## 2019-02-11 DIAGNOSIS — M7711 Lateral epicondylitis, right elbow: Secondary | ICD-10-CM | POA: Diagnosis not present

## 2019-02-11 DIAGNOSIS — M75102 Unspecified rotator cuff tear or rupture of left shoulder, not specified as traumatic: Secondary | ICD-10-CM | POA: Diagnosis not present

## 2019-02-11 DIAGNOSIS — M25512 Pain in left shoulder: Secondary | ICD-10-CM

## 2019-02-11 DIAGNOSIS — G5601 Carpal tunnel syndrome, right upper limb: Secondary | ICD-10-CM | POA: Diagnosis not present

## 2019-02-11 DIAGNOSIS — M25511 Pain in right shoulder: Secondary | ICD-10-CM

## 2019-02-11 DIAGNOSIS — G8929 Other chronic pain: Secondary | ICD-10-CM

## 2019-02-11 NOTE — Progress Notes (Signed)
Virtual Visit via Telephone Note  I connected with Ariel [name] on 02/11/19 at  2:40 PM EDT by telephone and verified that I am speaking with the correct person using two identifiers.  Location: Patient: home Provider: office   I discussed the limitations, risks, security and privacy concerns of performing an evaluation and management service by telephone and the availability of in person appointments. I also discussed with the patient that there may be a patient responsible charge related to this service. The patient expressed understanding and agreed to proceed.  Chief complaint pain and paresthesias right upper extremity  This patient is followed for left rotator cuff arthropathy, arthritis left shoulder.  Right tennis elbow resolved.  Right carpal tunnel syndrome.  She has been treated with 2 anti-inflammatories naproxen and ibuprofen she is taken hydrocodone 5mg .  She says that the 7.5 mg hydrocodone helps better but also knocks her out.  She complains of pain and paresthesias in the right upper extremity these have now been recurrent since prior to January of this year.  System review the patient seems to have multiple joint aches denies neck pain does have the pain paresthesias sensory loss in the right upper extremity.  No weakness.  No history of fever rash.  Medical decision making  Multiple problems Further diagnostic testing needed    Encounter Diagnoses  Name Primary?  . Chronic left shoulder pain Yes  . Right tennis elbow   . Carpal tunnel syndrome of right wrist   . Rotator cuff tear arthropathy of left shoulder   . Chronic pain in right shoulder     Chronic left shoulder pain patient at some point will need further imaging with MRI Right tennis elbow resolved Carpal tunnel symptoms right wrist needs nerve conduction study Chronic pain right shoulder patient says most of the problem is in the right hand and she can still lift her arm above her head no further  work-up planned at present    I discussed the assessment and treatment plan with the patient. The patient was provided an opportunity to ask questions and all were answered. The patient agreed with the plan and demonstrated an understanding of the instructions.   The patient was advised to call back or seek an in-person evaluation if the symptoms worsen or if the condition fails to improve as anticipated.  I provided 8 minutes of non-face-to-face time during this encounter.   [phone]   Fuller Canada, MD

## 2019-02-12 ENCOUNTER — Telehealth: Payer: Self-pay | Admitting: Radiology

## 2019-02-12 DIAGNOSIS — G5601 Carpal tunnel syndrome, right upper limb: Secondary | ICD-10-CM

## 2019-02-12 NOTE — Telephone Encounter (Signed)
-----   Message from Vickki Hearing, MD sent at 02/11/2019  2:43 PM EDT ----- Needs nerve study re current right carpal tunnel  Uses [facility] transportation   Results will be called to the patient

## 2019-02-14 ENCOUNTER — Telehealth: Payer: Self-pay | Admitting: Orthopedic Surgery

## 2019-02-14 NOTE — Telephone Encounter (Signed)
Patient called wanting to cancel her appointment on 6/23. I explained to her that the appointment is for her Nerve Conduction Study with Dr. Alvester Morin. She said she would be out of town then. I gave her the office number for Dr. Alvester Morin and told her to call them to see if they could schedule her appointment earlier/later due to her going out of town. I told her that the sooner she has the study then Dr. Romeo Apple would be able to explain the next step for her treatment. She agreed with this.

## 2019-02-27 ENCOUNTER — Telehealth: Payer: Self-pay | Admitting: Orthopedic Surgery

## 2019-02-27 ENCOUNTER — Other Ambulatory Visit: Payer: Self-pay | Admitting: Orthopedic Surgery

## 2019-02-27 DIAGNOSIS — G8929 Other chronic pain: Secondary | ICD-10-CM

## 2019-02-27 MED ORDER — HYDROCODONE-ACETAMINOPHEN 5-325 MG PO TABS: 1.0000 | ORAL_TABLET | Freq: Four times a day (QID) | ORAL | 0 refills | Status: DC | PRN

## 2019-02-27 NOTE — Telephone Encounter (Signed)
Patient is asking for something for pain. States her shoulder is bothering her a lot. She is asking if Dr. Romeo Apple will prescribe 7.5 mg Hydrocodone since the 5/325 mg didn't help the last time.  PATIENT USES [facility] IN [place]

## 2019-03-16 ENCOUNTER — Other Ambulatory Visit: Payer: Self-pay | Admitting: Nurse Practitioner

## 2019-03-16 DIAGNOSIS — E7800 Pure hypercholesterolemia, unspecified: Secondary | ICD-10-CM

## 2019-03-26 ENCOUNTER — Encounter: Payer: Medicare Other | Admitting: Physical Medicine and Rehabilitation

## 2019-04-03 ENCOUNTER — Other Ambulatory Visit: Payer: Self-pay | Admitting: Nurse Practitioner

## 2019-04-03 DIAGNOSIS — E7800 Pure hypercholesterolemia, unspecified: Secondary | ICD-10-CM

## 2019-04-04 ENCOUNTER — Other Ambulatory Visit: Payer: Self-pay | Admitting: Nurse Practitioner

## 2019-04-04 DIAGNOSIS — E7800 Pure hypercholesterolemia, unspecified: Secondary | ICD-10-CM

## 2019-04-12 ENCOUNTER — Ambulatory Visit (INDEPENDENT_AMBULATORY_CARE_PROVIDER_SITE_OTHER): Payer: Medicare Other | Admitting: Physical Medicine and Rehabilitation

## 2019-04-12 ENCOUNTER — Other Ambulatory Visit: Payer: Self-pay

## 2019-04-12 ENCOUNTER — Encounter: Payer: Self-pay | Admitting: Physical Medicine and Rehabilitation

## 2019-04-12 DIAGNOSIS — R202 Paresthesia of skin: Secondary | ICD-10-CM | POA: Diagnosis not present

## 2019-04-12 NOTE — Progress Notes (Signed)
Numeric Pain Rating Scale and Functional Assessment Average Pain 6   In the last MONTH (on 0-10 scale) has pain interfered with the following?  1. General activity like being  able to carry out your everyday physical activities such as walking, climbing stairs, carrying groceries, or moving a chair?  Rating(6)  

## 2019-04-15 ENCOUNTER — Encounter: Payer: Self-pay | Admitting: Physical Medicine and Rehabilitation

## 2019-04-15 ENCOUNTER — Telehealth: Payer: Self-pay

## 2019-04-15 NOTE — Progress Notes (Signed)
Ariel [name] - 73 y.o. female MRN [id number]  Date of birth: 07/25/1946  Office Visit Note: Visit Date: 04/12/2019 PCP: Bennie PieriniMartin, Mary-Margaret, FNP Referred by: Daphine DeutscherMartin, Mary-Margaret, *  Subjective: Chief Complaint  Patient presents with  . Right Hand - Pain, Numbness  . Numbness    right UE   HPI:  Ariel [name] is a 73 y.o. female who comes in today At the request of Dr. Fuller CanadaStanley Harrison for electrodiagnostic study of the right upper limb.  Patient is right-hand dominant with months of chronic worsening hand pain with numbness and tingling particularly in the radial digits and sometimes the fifth digit.  She gets worsening nocturnal complaints as well as using her hand and talking on the phone.  She has a prior history of carpal tunnel release on both the right and left hand.  The right hand was completed in 2008.  She has not had recent electrodiagnostic studies.  She denies any frank radicular pain or paresthesia.  She denies diabetes or thyroid disease.  ROS Otherwise per HPI.  Assessment & Plan: Visit Diagnoses:  1. Paresthesia of skin     Plan:  Impression: The above electrodiagnostic study is ABNORMAL and reveals evidence of a moderate right median nerve entrapment at the wrist (carpal tunnel syndrome) affecting sensory and motor components.   There is no significant electrodiagnostic evidence of any other focal nerve entrapment, brachial plexopathy or cervical radiculopathy.   Recommendations: 1.  Follow-up with referring physician. 2.  Continue current management of symptoms. 3.  Continue use of resting splint at night-time and as needed during the day. 4.  Suggest surgical evaluation.  Meds & Orders: No orders of the defined types were placed in this encounter.   Orders Placed This Encounter  Procedures  . NCV with EMG (electromyography)    Follow-up: Return for Fuller CanadaStanley Harrison, MD.   Procedures: No procedures performed  EMG & NCV Findings: Evaluation of the  right median motor nerve showed prolonged distal onset latency (4.5 ms) and decreased conduction velocity (Elbow-Wrist, 49 m/s).  The right median (across palm) sensory nerve showed no response (Palm) and prolonged distal peak latency (4.3 ms).  All remaining nerves (as indicated in the following tables) were within normal limits.    All examined muscles (as indicated in the following table) showed no evidence of electrical instability.    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   There is no significant electrodiagnostic evidence of any other focal nerve entrapment, brachial plexopathy or cervical radiculopathy.   Recommendations: 1.  Follow-up with referring physician. 2.  Continue current management of symptoms. 3.  Continue use of resting splint at night-time and as needed during the day. 4.  Suggest surgical evaluation.  ___________________________ Naaman PlummerFred Elza Varricchio FAAPMR Board Certified, American Board of Physical Medicine and Rehabilitation    Nerve Conduction Studies Anti Sensory Summary Table   Stim Site NR Peak (ms) Norm Peak (ms) P-T Amp (V) Norm P-T Amp Site1 Site2 Delta-P (ms) Dist (cm) Vel (m/s) Norm Vel (m/s)  Right Median Acr Palm Anti Sensory (2nd Digit)  31.9C  Wrist    *4.3 <3.6 41.2 >10 Wrist Palm  0.0    Palm *NR  <2.0          Right Radial Anti Sensory (Base 1st Digit)  33.1C  Wrist    2.2 <3.1 24.9  Wrist Base 1st  Digit 2.2 0.0    Right Ulnar Anti Sensory (5th Digit)  32.2C  Wrist    3.6 <3.7 19.5 >15.0 Wrist 5th Digit 3.6 14.0 39 >38   Motor Summary Table   Stim Site NR Onset (ms) Norm Onset (ms) O-P Amp (mV) Norm O-P Amp Site1 Site2 Delta-0 (ms) Dist (cm) Vel (m/s) Norm Vel (m/s)  Right Median Motor (Abd Poll Brev)  32.7C  Wrist    *4.5 <4.2 6.4 >5 Elbow Wrist 4.6 22.5 *49 >50  Elbow    9.1  6.2          Right Ulnar Motor (Abd Dig Min)  31.7C  Wrist    2.8 <4.2 11.0 >3 B Elbow Wrist 3.9 21.0 54 >53  B Elbow    6.7  6.8  A Elbow B Elbow 1.9 10.0 53 >53  A Elbow    8.6  6.5          EMG   Side Muscle Nerve Root Ins Act Fibs Psw Amp Dur Poly Recrt Int Dennie BiblePat Comment  Right Abd Poll Brev Median C8-T1 Nml Nml Nml Nml Nml 0 Nml Nml   Right 1stDorInt Ulnar C8-T1 Nml Nml Nml Nml Nml 0 Nml Nml   Right PronatorTeres Median C6-7 Nml Nml Nml Nml Nml 0 Nml Nml   Right Biceps Musculocut C5-6 Nml Nml Nml Nml Nml 0 Nml Nml   Right Deltoid Axillary C5-6 Nml Nml Nml Nml Nml 0 Nml Nml     Nerve Conduction Studies Anti Sensory Left/Right Comparison   Stim Site L Lat (ms) R Lat (ms) L-R Lat (ms) L Amp (V) R Amp (V) L-R Amp (%) Site1 Site2 L Vel (m/s) R Vel (m/s) L-R Vel (m/s)  Median Acr Palm Anti Sensory (2nd Digit)  31.9C  Wrist  *4.3   41.2  Wrist Palm     Palm             Radial Anti Sensory (Base 1st Digit)  33.1C  Wrist  2.2   24.9  Wrist Base 1st Digit     Ulnar Anti Sensory (5th Digit)  32.2C  Wrist  3.6   19.5  Wrist 5th Digit  39    Motor Left/Right Comparison   Stim Site L Lat (ms) R Lat (ms) L-R Lat (ms) L Amp (mV) R Amp (mV) L-R Amp (%) Site1 Site2 L Vel (m/s) R Vel (m/s) L-R Vel (m/s)  Median Motor (Abd Poll Brev)  32.7C  Wrist  *4.5   6.4  Elbow Wrist  *49   Elbow  9.1   6.2        Ulnar Motor (Abd Dig Min)  31.7C  Wrist  2.8   11.0  B Elbow Wrist  54   B Elbow  6.7   6.8  A Elbow B Elbow  53   A Elbow  8.6   6.5           Waveforms:            Clinical History: No specialty comments available.     Objective:  VS:  HT:    WT:   BMI:     BP:   HR: bpm  TEMP: ( )  RESP:  Physical Exam Musculoskeletal:        General: No swelling, tenderness or deformity.     Comments: Inspection reveals no atrophy of the bilateral APB or FDI or hand intrinsics. There is no swelling, color changes, allodynia or dystrophic changes.  There is 5 out of 5 strength in the  bilateral wrist extension, finger abduction and long finger flexion. There is intact sensation to light touch in all dermatomal and peripheral nerve distributions. There is a negative Froment's test bilaterally. There is a negative Hoffmann's test bilaterally.  Skin:    General: Skin is warm and dry.     Findings: No erythema or rash.  Neurological:     General: No focal deficit present.     Mental Status: She is alert and oriented to person, place, and time.     Motor: No weakness or abnormal muscle tone.     Coordination: Coordination normal.  Psychiatric:        Mood and Affect: Mood normal.        Behavior: Behavior normal.     Ortho Exam Imaging: No results found.

## 2019-04-15 NOTE — Telephone Encounter (Signed)
Patient left message on voicemail stating she had her CNS done on Friday with Dr. Ernestina Patches. She is asking if you will call her with results.  [phone]

## 2019-04-15 NOTE — Telephone Encounter (Signed)
I will but I dont have the report

## 2019-04-15 NOTE — Procedures (Signed)
EMG & NCV Findings: Evaluation of the right median motor nerve showed prolonged distal onset latency (4.5 ms) and decreased conduction velocity (Elbow-Wrist, 49 m/s).  The right median (across palm) sensory nerve showed no response (Palm) and prolonged distal peak latency (4.3 ms).  All remaining nerves (as indicated in the following tables) were within normal limits.    All examined muscles (as indicated in the following table) showed no evidence of electrical instability.    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   There is no significant electrodiagnostic evidence of any other focal nerve entrapment, brachial plexopathy or cervical radiculopathy.   Recommendations: 1.  Follow-up with referring physician. 2.  Continue current management of symptoms. 3.  Continue use of resting splint at night-time and as needed during the day. 4.  Suggest surgical evaluation.  ___________________________ Naaman PlummerFred Falisa Lamora FAAPMR Board Certified, American Board of Physical Medicine and Rehabilitation    Nerve Conduction Studies Anti Sensory Summary Table   Stim Site NR Peak (ms) Norm Peak (ms) P-T Amp (V) Norm P-T Amp Site1 Site2 Delta-P (ms) Dist (cm) Vel (m/s) Norm Vel (m/s)  Right Median Acr Palm Anti Sensory (2nd Digit)  31.9C  Wrist    *4.3 <3.6 41.2 >10 Wrist Palm  0.0    Palm *NR  <2.0          Right Radial Anti Sensory (Base 1st Digit)  33.1C  Wrist    2.2 <3.1 24.9  Wrist Base 1st Digit 2.2 0.0    Right Ulnar Anti Sensory (5th Digit)  32.2C  Wrist    3.6 <3.7 19.5 >15.0 Wrist 5th Digit 3.6 14.0 39 >38   Motor Summary Table   Stim Site NR Onset (ms) Norm Onset (ms) O-P Amp (mV) Norm O-P Amp Site1 Site2 Delta-0 (ms) Dist (cm) Vel (m/s) Norm Vel (m/s)  Right Median Motor (Abd Poll Brev)  32.7C  Wrist    *4.5 <4.2 6.4 >5 Elbow  Wrist 4.6 22.5 *49 >50  Elbow    9.1  6.2         Right Ulnar Motor (Abd Dig Min)  31.7C  Wrist    2.8 <4.2 11.0 >3 B Elbow Wrist 3.9 21.0 54 >53  B Elbow    6.7  6.8  A Elbow B Elbow 1.9 10.0 53 >53  A Elbow    8.6  6.5          EMG   Side Muscle Nerve Root Ins Act Fibs Psw Amp Dur Poly Recrt Int Dennie BiblePat Comment  Right Abd Poll Brev Median C8-T1 Nml Nml Nml Nml Nml 0 Nml Nml   Right 1stDorInt Ulnar C8-T1 Nml Nml Nml Nml Nml 0 Nml Nml   Right PronatorTeres Median C6-7 Nml Nml Nml Nml Nml 0 Nml Nml   Right Biceps Musculocut C5-6 Nml Nml Nml Nml Nml 0 Nml Nml   Right Deltoid Axillary C5-6 Nml Nml Nml Nml Nml 0 Nml Nml     Nerve Conduction Studies Anti Sensory Left/Right Comparison   Stim Site L Lat (ms) R Lat (ms) L-R Lat (ms) L Amp (V) R Amp (V) L-R Amp (%) Site1 Site2 L Vel (m/s) R Vel (m/s) L-R Vel (m/s)  Median Acr Palm Anti Sensory (2nd Digit)  31.9C  Wrist  *4.3   41.2  Wrist [employer] MaterialsPalm     Palm  Radial Anti Sensory (Base 1st Digit)  33.1C  Wrist  2.2   24.9  Wrist Base 1st Digit     Ulnar Anti Sensory (5th Digit)  32.2C  Wrist  3.6   19.5  Wrist 5th Digit  39    Motor Left/Right Comparison   Stim Site L Lat (ms) R Lat (ms) L-R Lat (ms) L Amp (mV) R Amp (mV) L-R Amp (%) Site1 Site2 L Vel (m/s) R Vel (m/s) L-R Vel (m/s)  Median Motor (Abd Poll Brev)  32.7C  Wrist  *4.5   6.4  Elbow Wrist  *49   Elbow  9.1   6.2        Ulnar Motor (Abd Dig Min)  31.7C  Wrist  2.8   11.0  B Elbow Wrist  54   B Elbow  6.7   6.8  A Elbow B Elbow  53   A Elbow  8.6   6.5           Waveforms:

## 2019-04-16 ENCOUNTER — Ambulatory Visit (INDEPENDENT_AMBULATORY_CARE_PROVIDER_SITE_OTHER): Payer: Medicare HMO | Admitting: Nurse Practitioner

## 2019-04-16 ENCOUNTER — Other Ambulatory Visit: Payer: Self-pay

## 2019-04-16 ENCOUNTER — Other Ambulatory Visit: Payer: Self-pay | Admitting: *Deleted

## 2019-04-16 ENCOUNTER — Encounter: Payer: Self-pay | Admitting: Nurse Practitioner

## 2019-04-16 VITALS — BP 124/75 | HR 80 | Temp 98.0°F | Ht 64.0 in | Wt 184.8 lb

## 2019-04-16 DIAGNOSIS — N3281 Overactive bladder: Secondary | ICD-10-CM

## 2019-04-16 DIAGNOSIS — R351 Nocturia: Secondary | ICD-10-CM

## 2019-04-16 DIAGNOSIS — I10 Essential (primary) hypertension: Secondary | ICD-10-CM

## 2019-04-16 DIAGNOSIS — R1030 Lower abdominal pain, unspecified: Secondary | ICD-10-CM | POA: Diagnosis not present

## 2019-04-16 DIAGNOSIS — R6889 Other general symptoms and signs: Secondary | ICD-10-CM | POA: Diagnosis not present

## 2019-04-16 DIAGNOSIS — E7800 Pure hypercholesterolemia, unspecified: Secondary | ICD-10-CM

## 2019-04-16 LAB — URINALYSIS, COMPLETE
Bilirubin, UA: NEGATIVE
Glucose, UA: NEGATIVE
Ketones, UA: NEGATIVE
Leukocytes,UA: NEGATIVE
Nitrite, UA: NEGATIVE
Protein,UA: NEGATIVE
Specific Gravity, UA: 1.025 (ref 1.005–1.030)
Urobilinogen, Ur: 0.2 mg/dL (ref 0.2–1.0)
pH, UA: 5.5 (ref 5.0–7.5)

## 2019-04-16 LAB — MICROSCOPIC EXAMINATION: Renal Epithel, UA: NONE SEEN /hpf

## 2019-04-16 MED ORDER — VALSARTAN-HYDROCHLOROTHIAZIDE 160-25 MG PO TABS: 0.5000 | ORAL_TABLET | Freq: Every day | ORAL | 0 refills | Status: DC

## 2019-04-16 MED ORDER — ROSUVASTATIN CALCIUM 10 MG PO TABS: 10.0000 mg | ORAL_TABLET | Freq: Every day | ORAL | 0 refills | Status: DC

## 2019-04-16 MED ORDER — OXYBUTYNIN CHLORIDE ER 5 MG PO TB24: 5.0000 mg | ORAL_TABLET | Freq: Every day | ORAL | 3 refills | Status: DC

## 2019-04-16 NOTE — Progress Notes (Signed)
   Subjective:    Patient ID: Ariel [name], female    DOB: [birth year], 73 y.o.   MRN: [id number]   Chief Complaint: lower abdominal pain   HPI Patient comes in today c/o abdominal pain. She says she has had cramping in bottom of her stomach for 4-5 days. The pain comes and goes and is worse when she is trying to have a bowel movement. Stools have been little balls for the last week.     Review of Systems  Constitutional: Negative for chills, fatigue and fever.  Respiratory: Negative.   Cardiovascular: Negative.   Gastrointestinal: Positive for abdominal pain and constipation. Negative for diarrhea, nausea and vomiting.  Genitourinary: Positive for frequency and urgency. Negative for dysuria.  Neurological: Negative.   Psychiatric/Behavioral: Negative.   All other systems reviewed and are negative.      Objective:   Physical Exam Vitals signs and nursing note reviewed.  Constitutional:      Appearance: Normal appearance.  Cardiovascular:     Rate and Rhythm: Normal rate and regular rhythm.     Pulses: Normal pulses.     Heart sounds: Normal heart sounds.  Pulmonary:     Breath sounds: Normal breath sounds.  Skin:    General: Skin is warm and dry.  Neurological:     General: No focal deficit present.     Mental Status: She is alert and oriented to person, place, and time.  Psychiatric:        Mood and Affect: Mood normal.        Behavior: Behavior normal.    BP 124/75   Pulse 80   Temp 98 F (36.7 C) (Oral)   Ht '5\' 4"'$  (1.626 m)   Wt 184 lb 12.8 oz (83.8 kg)   BMI 31.72 kg/m   UA 2= blood- otherwise normal      Assessment & Plan:  Ariel [name] in today with chief complaint of lower abdominal pain   1. Lower abdominal pain No xray in office today Probable constipation- milk of magnesia and 6oz prune juice- once good results then start on miralax daily and increase fiber in diet - Urinalysis, Complete - CBC with Differential/Platelet - BMP8+EGFR   Mary-Margaret Hassell Done, FNP

## 2019-04-16 NOTE — Addendum Note (Signed)
Addended by: Chevis Pretty on: 04/16/2019 12:41 PM   Modules accepted: Orders

## 2019-04-16 NOTE — Patient Instructions (Signed)

## 2019-04-17 LAB — CBC WITH DIFFERENTIAL/PLATELET
Basophils Absolute: 0.1 10*3/uL (ref 0.0–0.2)
Basos: 1 %
EOS (ABSOLUTE): 0.1 10*3/uL (ref 0.0–0.4)
Eos: 2 %
Hematocrit: 37.2 % (ref 34.0–46.6)
Hemoglobin: 12.3 g/dL (ref 11.1–15.9)
Immature Grans (Abs): 0 10*3/uL (ref 0.0–0.1)
Immature Granulocytes: 0 %
Lymphocytes Absolute: 2.6 10*3/uL (ref 0.7–3.1)
Lymphs: 46 %
MCH: 27.9 pg (ref 26.6–33.0)
MCHC: 33.1 g/dL (ref 31.5–35.7)
MCV: 84 fL (ref 79–97)
Monocytes Absolute: 0.8 10*3/uL (ref 0.1–0.9)
Monocytes: 14 %
Neutrophils Absolute: 2 10*3/uL (ref 1.4–7.0)
Neutrophils: 37 %
Platelets: 339 10*3/uL (ref 150–450)
RBC: 4.41 x10E6/uL (ref 3.77–5.28)
RDW: 12.8 % (ref 11.7–15.4)
WBC: 5.6 10*3/uL (ref 3.4–10.8)

## 2019-04-17 LAB — BMP8+EGFR
BUN/Creatinine Ratio: 23 (ref 12–28)
BUN: 15 mg/dL (ref 8–27)
CO2: 25 mmol/L (ref 20–29)
Calcium: 10.2 mg/dL (ref 8.7–10.3)
Chloride: 105 mmol/L (ref 96–106)
Creatinine, Ser: 0.64 mg/dL (ref 0.57–1.00)
GFR calc Af Amer: 103 mL/min/{1.73_m2} (ref >59)
GFR calc non Af Amer: 89 mL/min/{1.73_m2} (ref >59)
Glucose: 89 mg/dL (ref 65–99)
Potassium: 4.1 mmol/L (ref 3.5–5.2)
Sodium: 143 mmol/L (ref 134–144)

## 2019-05-08 ENCOUNTER — Other Ambulatory Visit: Payer: Self-pay | Admitting: Nurse Practitioner

## 2019-05-08 DIAGNOSIS — E7800 Pure hypercholesterolemia, unspecified: Secondary | ICD-10-CM

## 2019-05-13 NOTE — Telephone Encounter (Signed)
Patient called to follow up on Nerve conduction study, per last message 04/15/19. States has seen Dr Ernestina Patches. If report available, schedule telephone visit? Patient is at work daily until at least 3:00pm. Please call 3:30pm on at home# [phone]

## 2019-05-13 NOTE — Telephone Encounter (Signed)
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   There is no significant electrodiagnostic evidence of any other focal nerve entrapment, brachial plexopathy or cervical radiculopathy.   Recommendations: 1.  Follow-up with referring physician. 2.  Continue current management of symptoms. 3.  Continue use of resting splint at night-time and as needed during the day. 4.  Suggest surgical evaluation.  ___________________________ Wonda Olds Board Certified, American Board of Physical Medicine and Rehabilitation

## 2019-05-16 ENCOUNTER — Telehealth: Payer: Self-pay | Admitting: Orthopedic Surgery

## 2019-05-16 NOTE — Telephone Encounter (Signed)
Needs surgery right carpal tunnel release

## 2019-05-16 NOTE — Telephone Encounter (Signed)
Phone call to Ariel [name] at her house and with her mobile number no answer  Patient needs a right carpal tunnel release.  I need to talk to her before the surgery can be scheduled so that I can review the risks and benefits of the surgery and see that she still wants to proceed with the open right carpal tunnel release

## 2019-05-17 NOTE — Telephone Encounter (Signed)
I called home, was given another number to try [phone], called no answer  I will call her when I am back in office / I am out next week.

## 2019-05-20 ENCOUNTER — Telehealth: Payer: Self-pay

## 2019-05-20 NOTE — Telephone Encounter (Signed)
Patient left message on voicemail Friday afternoon that she was returning a call from our office. I read your note and tried to reach her to let her know you would give her a call when you return to the office but got no answer.

## 2019-05-22 ENCOUNTER — Ambulatory Visit (INDEPENDENT_AMBULATORY_CARE_PROVIDER_SITE_OTHER): Payer: Medicare HMO | Admitting: *Deleted

## 2019-05-22 VITALS — Ht 64.0 in | Wt 184.7 lb

## 2019-05-22 DIAGNOSIS — Z0000 Encounter for general adult medical examination without abnormal findings: Secondary | ICD-10-CM | POA: Diagnosis not present

## 2019-05-22 NOTE — Progress Notes (Signed)
MEDICARE ANNUAL WELLNESS VISIT  05/22/2019  Telephone Visit Disclaimer This Medicare AWV was conducted by telephone due to national recommendations for restrictions regarding the COVID-19 Pandemic (e.g. social distancing).  I verified, using two identifiers, that I am speaking with Ariel [name] or their authorized healthcare agent. I discussed the limitations, risks, security, and privacy concerns of performing an evaluation and management service by telephone and the potential availability of an in-person appointment in the future. The patient expressed understanding and agreed to proceed.   Subjective:  Ariel [name] is a 73 y.o. female patient of Chevis Pretty, [place] who had a Medicare Annual Wellness Visit today via telephone. Ariel [name] is Working part time and lives alone. she has 1 child. she reports that she is socially active and does interact with friends/family regularly. she is not physically active and enjoys reading.  Patient Care Team: Chevis Pretty, FNP as PCP - General (Nurse Practitioner) Carole Civil, MD as Consulting Physician (Orthopedic Surgery)  Advanced Directives 05/22/2019 05/11/2018 10/13/2016 03/17/2016 03/15/2016 06/21/2014  Does Patient Have a Medical Advance Directive? No No No No No No  Would patient like information on creating a medical advance directive? Yes (MAU/Ambulatory/Procedural Areas - Information given) No - Patient declined No - Patient declined - No - patient declined information No - patient declined information    Hospital Utilization Over the Past 12 Months: # of hospitalizations or ER visits: 0 # of surgeries: 0  Review of Systems    Patient reports that her overall health is unchanged compared to last year.  Patient Reported Readings (BP, Pulse, CBG, Weight, etc) none  Review of Systems: No complaints  All other systems negative.  Pain Assessment Pain : No/denies pain     Current Medications & Allergies  (verified) Allergies as of 05/22/2019   No Known Allergies     Medication List       Accurate as of May 22, 2019  3:51 PM. If you have any questions, ask your nurse or doctor.        HYDROcodone-acetaminophen 5-325 MG tablet Commonly known as: NORCO/VICODIN Take 1 tablet by mouth every 6 (six) hours as needed for moderate pain.   meloxicam 7.5 MG tablet Commonly known as: MOBIC TAKE 1 TABLET BY MOUTH EVERY DAY   oxybutynin 5 MG 24 hr tablet Commonly known as: DITROPAN-XL Take 1 tablet (5 mg total) by mouth at bedtime.   rosuvastatin 10 MG tablet Commonly known as: Crestor Take 1 tablet (10 mg total) by mouth daily.   valsartan-hydrochlorothiazide 160-25 MG tablet Commonly known as: DIOVAN-HCT Take 0.5 tablets by mouth daily.       History (reviewed): Past Medical History:  Diagnosis Date   Arthritis    Diverticulosis    Hyperlipidemia    Hypertension    Osteopenia    Past Surgical History:  Procedure Laterality Date   ABDOMINAL HYSTERECTOMY  1985   CARPAL TUNNEL RELEASE Right 2007   CARPAL TUNNEL RELEASE Left 03/17/2016   Procedure: LEFT CARPAL TUNNEL RELEASE;  Surgeon: Carole Civil, MD;  Location: [facility];  Service: Orthopedics;  Laterality: Left;   Family History  Problem Relation Age of Onset   Diabetes Mother    Cancer Father 66       colon   Alcohol abuse Brother    Cancer Brother        stomach   Lupus Brother    Social History   Socioeconomic History   Marital status: Widowed  Spouse name: Not on file   Number of children: 1   Years of education: 5812   Highest education level: 12th grade  Occupational History   Occupation: Retired    Comment: Retired from the [employer]  Social Network [employer]   Financial resource strain: Not hard at [employer]   Food insecurity    Worry: Never true    Inability: Never true   Transportation needs    Medical: Yes    Non-medical: No  Tobacco Use   Smoking status: Never Smoker   Smokeless  tobacco: Never Used  Substance and Sexual Activity   Alcohol use: Yes    Comment: social   Drug use: No   Sexual activity: Yes    Birth control/protection: Surgical  Lifestyle   Physical activity    Days per week: 3 days    Minutes per session: 30 min   Stress: Not at all  Relationships   Social connections    Talks on phone: More than three times a week    Gets together: More than three times a week    Attends religious service: More than 4 times per year    Active member of club or organization: No    Attends meetings of clubs or organizations: Never    Relationship status: Widowed  Other Topics Concern   Not on file  Social History Narrative   Not on file    Activities of Daily Living In your present state of health, do you have any difficulty performing the following activities: 05/22/2019  Hearing? N  Vision? N  Difficulty concentrating or making decisions? N  Walking or climbing stairs? N  Dressing or bathing? N  Doing errands, shopping? N  Preparing Food and eating ? N  Using the Toilet? N  In the past six months, have you accidently leaked urine? N  Do you have problems with loss of bowel control? N  Managing your Medications? N  Managing your Finances? N  Housekeeping or managing your Housekeeping? N  Some recent data might be hidden    Patient Education/ Literacy How often do you need to have someone help you when you read instructions, pamphlets, or other written materials from your doctor or pharmacy?: 1 - Never What is the last grade level you completed in school?: 12th Grade  Exercise Current Exercise Habits: The patient does not participate in regular exercise at present  Diet Patient reports consuming 2 meals a day and 0 snack(s) a day Patient reports that her primary diet is: Regular Patient reports that she does have regular access to food.   Depression Screen PHQ 2/9 Scores 05/22/2019 04/16/2019 07/31/2018 07/03/2018 05/11/2018 03/06/2018  02/28/2018  PHQ - 2 Score 0 0 0 0 0 0 0     Fall Risk Fall Risk  05/22/2019 04/16/2019 07/31/2018 07/03/2018 05/11/2018  Falls in the past year? 0 0 No No No  Number falls in past yr: 0 - - - -  Injury with Fall? 0 - - - -     Objective:  Ariel [name] seemed alert and oriented and she participated appropriately during our telephone visit.  Blood Pressure Weight BMI  BP Readings from Last 3 Encounters:  04/16/19 124/75  10/26/18 117/74  07/31/18 102/66   Wt Readings from Last 3 Encounters:  05/22/19 184 lb 11.9 oz (83.8 kg)  04/16/19 184 lb 12.8 oz (83.8 kg)  10/26/18 185 lb (83.9 kg)   BMI Readings from Last 1 Encounters:  05/22/19 31.71 kg/m    *  Unable to obtain current vital signs, weight, and BMI due to telephone visit type  Hearing/Vision   Ariel [name] did not seem to have difficulty with hearing/understanding during the telephone conversation  Reports that she has had a formal eye exam by an eye care professional within the past year  Reports that she has not had a formal hearing evaluation within the past year *Unable to fully assess hearing and vision during telephone visit type  Cognitive Function: 6CIT Screen 05/22/2019  What Year? 0 points  What month? 0 points  What time? 0 points  Count back from 20 0 points  Months in reverse 0 points  Repeat phrase 0 points  Total Score 0   (Normal:0-7, Significant for Dysfunction: >8)  Normal Cognitive Function Screening: Yes   Immunization & Health Maintenance Record Immunization History  Administered Date(s) Administered   Influenza Whole 08/03/2012   Influenza, High Dose Seasonal PF 07/03/2018   Pneumococcal Conjugate-13 12/08/2014   Pneumococcal Polysaccharide-23 05/18/2016   Tdap 10/13/2016   Zoster 04/29/2014    Health Maintenance  Topic Date Due   INFLUENZA VACCINE  05/04/2019   MAMMOGRAM  12/26/2019   COLONOSCOPY  05/29/2022   TETANUS/TDAP  10/13/2026   DEXA SCAN  Completed   Hepatitis C  Screening  Completed   PNA vac Low Risk Adult  Completed       Assessment  This is a routine wellness examination for Ariel [name].  Health Maintenance: Due or Overdue Health Maintenance Due  Topic Date Due   INFLUENZA VACCINE  05/04/2019    Ariel [name] does not need a referral for Community Assistance: Care Management:   no Social Work:    no Prescription Assistance:  no Nutrition/Diabetes Education:  no   Plan:  Personalized Goals Goals Addressed   None    Personalized Health Maintenance & Screening Recommendations  Influenza vaccine  Lung Cancer Screening Recommended: no (Low Dose CT Chest recommended if Age 5-80 years, 30 pack-year currently smoking OR have quit w/in past 15 years) Hepatitis C Screening recommended: no HIV Screening recommended: no  Advanced Directives: Written information was prepared per patient's request.  Referrals & Orders No orders of the defined types were placed in this encounter.   Follow-up Plan  Follow-up with Ariel [name], Mary-Margaret, FNP as planned    I have personally reviewed and noted the following in the patients chart:    Medical and social history  Use of alcohol, tobacco or illicit drugs   Current medications and supplements  Functional ability and status  Nutritional status  Physical activity  Advanced directives  List of other physicians  Hospitalizations, surgeries, and ER visits in previous 12 months  Vitals  Screenings to include cognitive, depression, and falls  Referrals and appointments  In addition, I have reviewed and discussed with Gean Birchwoodosetta [name] certain preventive protocols, quality metrics, and best practice recommendations. A written personalized care plan for preventive services as well as general preventive health recommendations is available and can be mailed to the patient at her request.      Caryl BisChanda M Aarika Moon, LPN  1/[id number]/09608/19/2020

## 2019-05-22 NOTE — Patient Instructions (Signed)
[name] , Thank you for taking time to come for your Medicare Wellness Visit. I appreciate your ongoing commitment to your health goals. Please review the following plan we discussed and let me know if I can assist you in the future.   These are the goals we discussed: Goals     DIET - EAT MORE FRUITS AND VEGETABLES     Exercise 3x per week (30 min per time)     Try to walk for 30 minutes 3 times per week       This is a list of the screening recommended for you and due dates:  Health Maintenance  Topic Date Due   Flu Shot  05/04/2019   Mammogram  12/26/2019   Colon Cancer Screening  05/29/2022   Tetanus Vaccine  10/13/2026   DEXA scan (bone density measurement)  Completed    Hepatitis C: One time screening is recommended by Center for Disease Control  (CDC) for  adults born from [birth year] through 1964.   Completed   Pneumonia vaccines  Completed     Preventive Care 36 Years and Older, Female Preventive care refers to lifestyle choices and visits with your health care provider that can promote health and wellness. This includes:  A yearly physical exam. This is also called an annual well check.  Regular dental and eye exams.  Immunizations.  Screening for certain conditions.  Healthy lifestyle choices, such as diet and exercise. What can I expect for my preventive care visit? Physical exam Your health care provider will check:  Height and weight. These may be used to calculate body mass index (BMI), which is a measurement that tells if you are at a healthy weight.  Heart rate and blood pressure.  Your skin for abnormal spots. Counseling Your health care provider may ask you questions about:  Alcohol, tobacco, and drug use.  Emotional well-being.  Home and relationship well-being.  Sexual activity.  Eating habits.  History of falls.  Memory and ability to understand (cognition).  Work and work Statistician.  Pregnancy and menstrual  history. What immunizations do I need?  Influenza (flu) vaccine  This is recommended every year. Tetanus, diphtheria, and pertussis (Tdap) vaccine  You may need a Td booster every 10 years. Varicella (chickenpox) vaccine  You may need this vaccine if you have not already been vaccinated. Zoster (shingles) vaccine  You may need this after age 15. Pneumococcal conjugate (PCV13) vaccine  One dose is recommended after age 80. Pneumococcal polysaccharide (PPSV23) vaccine  One dose is recommended after age 33. Measles, mumps, and rubella (MMR) vaccine  You may need at least one dose of MMR if you were born in 1957 or later. You may also need a second dose. Meningococcal conjugate (MenACWY) vaccine  You may need this if you have certain conditions. Hepatitis A vaccine  You may need this if you have certain conditions or if you travel or work in places where you may be exposed to hepatitis A. Hepatitis B vaccine  You may need this if you have certain conditions or if you travel or work in places where you may be exposed to hepatitis B. Haemophilus influenzae type b (Hib) vaccine  You may need this if you have certain conditions. You may receive vaccines as individual doses or as more than one vaccine together in one shot (combination vaccines). Talk with your health care provider about the risks and benefits of combination vaccines. What tests do I need? Blood tests  Lipid and cholesterol levels. These may be checked every 5 years, or more frequently depending on your overall health.  Hepatitis C test.  Hepatitis B test. Screening  Lung cancer screening. You may have this screening every year starting at age 53 if you have a 30-pack-year history of smoking and currently smoke or have quit within the past 15 years.  Colorectal cancer screening. All adults should have this screening starting at age 27 and continuing until age 39. Your health care provider may recommend  screening at age 70 if you are at increased risk. You will have tests every 1-10 years, depending on your results and the type of screening test.  Diabetes screening. This is done by checking your blood sugar (glucose) after you have not eaten for a while (fasting). You may have this done every 1-3 years.  Mammogram. This may be done every 1-2 years. Talk with your health care provider about how often you should have regular mammograms.  BRCA-related cancer screening. This may be done if you have a family history of breast, ovarian, tubal, or peritoneal cancers. Other tests  Sexually transmitted disease (STD) testing.  Bone density scan. This is done to screen for osteoporosis. You may have this done starting at age 38. Follow these instructions at home: Eating and drinking  Eat a diet that includes fresh fruits and vegetables, whole grains, lean protein, and low-fat dairy products. Limit your intake of foods with high amounts of sugar, saturated fats, and salt.  Take vitamin and mineral supplements as recommended by your health care provider.  Do not drink alcohol if your health care provider tells you not to drink.  If you drink alcohol: ? Limit how much you have to 0-1 drink a day. ? Be aware of how much alcohol is in your drink. In the [place] one drink equals one 12 oz bottle of beer (355 mL), one 5 oz glass of wine (148 mL), or one 1 oz glass of hard liquor (44 mL). Lifestyle  Take daily care of your teeth and gums.  Stay active. Exercise for at least 30 minutes on 5 or more days each week.  Do not use any products that contain nicotine or tobacco, such as cigarettes, e-cigarettes, and chewing tobacco. If you need help quitting, ask your health care provider.  If you are sexually active, practice safe sex. Use a condom or other form of protection in order to prevent STIs (sexually transmitted infections).  Talk with your health care provider about taking a low-dose aspirin or  statin. What's next?  Go to your health care provider once a year for a well check visit.  Ask your health care provider how often you should have your eyes and teeth checked.  Stay up to date on all vaccines. This information is not intended to replace advice given to you by your health care provider. Make sure you discuss any questions you have with your health care provider. Document Released: 10/16/2015 Document Revised: 09/13/2018 Document Reviewed: 09/13/2018 Elsevier Patient Education  2020 [place] Directive  Advance directives are legal documents that let you make choices ahead of time about your health care and medical treatment in case you become unable to communicate for yourself. Advance directives are a way for you to communicate your wishes to family, friends, and health care providers. This can help convey your decisions about end-of-life care if you become unable to communicate. Discussing and writing advance directives should happen over time  rather than all at once. Advance directives can be changed depending on your situation and what you want, even after you have signed the advance directives. If you do not have an advance directive, some states assign family decision makers to act on your behalf based on how closely you are related to them. Each state has its own laws regarding advance directives. You may want to check with your health care provider, attorney, or state representative about the laws in your state. There are different types of advance directives, such as:  Medical power of attorney.  Living will.  Do not resuscitate (DNR) or do not attempt resuscitation (DNAR) order. Health care proxy and medical power of attorney A health care proxy, also called a health care agent, is a person who is appointed to make medical decisions for you in cases in which you are unable to make the decisions yourself. Generally, people choose someone they know  well and trust to represent their preferences. Make sure to ask this person for an agreement to act as your proxy. A proxy may have to exercise judgment in the event of a medical decision for which your wishes are not known. A medical power of attorney is a legal document that names your health care proxy. Depending on the laws in your state, after the document is written, it may also need to be:  Signed.  Notarized.  Dated.  Copied.  Witnessed.  Incorporated into your medical record. You may also want to appoint someone to manage your financial affairs in a situation in which you are unable to do so. This is called a durable power of attorney for finances. It is a separate legal document from the durable power of attorney for health care. You may choose the same person or someone different from your health care proxy to act as your agent in financial matters. If you do not appoint a proxy, or if there is a concern that the proxy is not acting in your best interests, a court-appointed guardian may be designated to act on your behalf. Living will A living will is a set of instructions documenting your wishes about medical care when you cannot express them yourself. Health care providers should keep a copy of your living will in your medical record. You may want to give a copy to family members or friends. To alert caregivers in case of an emergency, you can place a card in your wallet to let them know that you have a living will and where they can find it. A living will is used if you become:  Terminally ill.  Incapacitated.  Unable to communicate or make decisions. Items to consider in your living will include:  The use or non-use of life-sustaining equipment, such as dialysis machines and breathing machines (ventilators).  A DNR or DNAR order, which is the instruction not to use cardiopulmonary resuscitation (CPR) if breathing or heartbeat stops.  The use or non-use of tube  feeding.  Withholding of food and fluids.  Comfort (palliative) care when the goal becomes comfort rather than a cure.  Organ and tissue donation. A living will does not give instructions for distributing your money and property if you should pass away. It is recommended that you seek the advice of a lawyer when writing a will. Decisions about taxes, beneficiaries, and asset distribution will be legally binding. This process can relieve your family and friends of any concerns surrounding disputes or questions that may come up about the  distribution of your assets. DNR or DNAR A DNR or DNAR order is a request not to have CPR in the event that your heart stops beating or you stop breathing. If a DNR or DNAR order has not been made and shared, a health care provider will try to help any patient whose heart has stopped or who has stopped breathing. If you plan to have surgery, talk with your health care provider about how your DNR or DNAR order will be followed if problems occur. Summary  Advance directives are the legal documents that allow you to make choices ahead of time about your health care and medical treatment in case you become unable to communicate for yourself.  The process of discussing and writing advance directives should happen over time. You can change the advance directives, even after you have signed them.  Advance directives include DNR or DNAR orders, living wills, and designating an agent as your medical power of attorney. This information is not intended to replace advice given to you by your health care provider. Make sure you discuss any questions you have with your health care provider. Document Released: 12/27/2007 Document Revised: 10/24/2018 Document Reviewed: 08/08/2016 Elsevier Patient Education  2020 [employer].

## 2019-05-27 ENCOUNTER — Other Ambulatory Visit: Payer: Self-pay

## 2019-05-27 ENCOUNTER — Ambulatory Visit: Payer: Medicare HMO | Admitting: Nurse Practitioner

## 2019-05-27 ENCOUNTER — Ambulatory Visit: Payer: Medicaid Other

## 2019-05-27 ENCOUNTER — Encounter: Payer: Self-pay | Admitting: Orthopedic Surgery

## 2019-05-27 ENCOUNTER — Ambulatory Visit (INDEPENDENT_AMBULATORY_CARE_PROVIDER_SITE_OTHER): Payer: Medicare HMO | Admitting: Orthopedic Surgery

## 2019-05-27 VITALS — BP 121/64 | HR 78 | Ht 64.0 in | Wt 180.0 lb

## 2019-05-27 DIAGNOSIS — M25512 Pain in left shoulder: Secondary | ICD-10-CM

## 2019-05-27 DIAGNOSIS — G8929 Other chronic pain: Secondary | ICD-10-CM | POA: Diagnosis not present

## 2019-05-27 DIAGNOSIS — M79644 Pain in right finger(s): Secondary | ICD-10-CM

## 2019-05-27 DIAGNOSIS — G5601 Carpal tunnel syndrome, right upper limb: Secondary | ICD-10-CM

## 2019-05-27 MED ORDER — HYDROCODONE-ACETAMINOPHEN 5-325 MG PO TABS: 1.0000 | ORAL_TABLET | Freq: Four times a day (QID) | ORAL | 0 refills | Status: DC | PRN

## 2019-05-27 NOTE — Telephone Encounter (Signed)
She came in today to discuss

## 2019-05-27 NOTE — Progress Notes (Signed)
1. NCV with EMG (electromyography) [[id number]] ordered by Magnus Sinning, MD at 04/15/19 0535  Pre-procedure Diagnoses  1. Paresthesia of skin [R20.2]    EMG & NCV Findings: Evaluation of the right median motor nerve showed prolonged distal onset latency (4.5 ms) and decreased conduction velocity (Elbow-Wrist, 49 m/s).  The right median (across palm) sensory nerve showed no response (Palm) and prolonged distal peak latency (4.3 ms).  All remaining nerves (as indicated in the following tables) were within normal limits.    All examined muscles (as indicated in the following table) showed no evidence of electrical instability.    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   There is no significant electrodiagnostic evidence of any other focal nerve entrapment, brachial plexopathy or cervical radiculopathy.   Recommendations: 1.  Follow-up with referring physician. 2.  Continue current management of symptoms. 3.  Continue use of resting splint at night-time and as needed during the day. 4.  Suggest surgical evaluation.  ___________________________ Wonda Olds Board Certified, American Board of Physical

## 2019-05-27 NOTE — Progress Notes (Signed)
Ariel [name]  05/27/2019  HISTORY SECTION :  Chief Complaint  Patient presents with  . Carpal Tunnel    right / discuss surgery   . Shoulder Pain    left   HPI The patient presents for evaluation of several problems  The main problem is that she has a carpal tunnel test that needs reviewing.  After review and discussion she is noted to have moderate recurrent carpal tunnel syndrome after surgery many years ago in [place].  However she says she can tolerate the symptoms at this time  Problem #2 chronic pain left shoulder had MRI showed no rotator cuff tear but extensive tendinosis and tendinitis  Problem #3 2 weeks ago she injured her right hand moving furniture she comes in with a flexed DIP joint right small finger with inability to extend and pain at the joint  Review of Systems  Respiratory: Negative for shortness of breath.   Cardiovascular: Negative for chest pain.  Musculoskeletal: Positive for joint pain.     Past Medical History:  Diagnosis Date  . Arthritis   . Diverticulosis   . Hyperlipidemia   . Hypertension   . Osteopenia     Past Surgical History:  Procedure Laterality Date  . ABDOMINAL HYSTERECTOMY  1985  . CARPAL TUNNEL RELEASE Right 2007  . CARPAL TUNNEL RELEASE Left 03/17/2016   Procedure: LEFT CARPAL TUNNEL RELEASE;  Surgeon: Carole Civil, MD;  Location: [facility];  Service: Orthopedics;  Laterality: Left;     No Known Allergies   Current Outpatient Medications:  .  rosuvastatin (CRESTOR) 10 MG tablet, Take 1 tablet (10 mg total) by mouth daily., Disp: 90 tablet, Rfl: 0 .  valsartan-hydrochlorothiazide (DIOVAN-HCT) 160-25 MG tablet, Take 0.5 tablets by mouth daily., Disp: 90 tablet, Rfl: 0 .  HYDROcodone-acetaminophen (NORCO/VICODIN) 5-325 MG tablet, Take 1 tablet by mouth every 6 (six) hours as needed for moderate pain., Disp: 30 tablet, Rfl: 0 .  oxybutynin (DITROPAN-XL) 5 MG 24 hr tablet, Take 1 tablet (5 mg total) by mouth at bedtime.  (Patient not taking: Reported on 05/27/2019), Disp: 30 tablet, Rfl: 3   PHYSICAL EXAM SECTION: 1) BP 121/64   Pulse 78   Ht 5\' 4"  (1.626 m)   Wt 180 lb (81.6 kg)   BMI 30.90 kg/m   Body mass index is 30.9 kg/m. General appearance: Well-developed well-nourished no gross deformities  2) Cardiovascular normal pulse and perfusion in all 4 extremities normal color without edema  3) Neurologically deep tendon reflexes are equal and normal, no sensation loss or deficits no pathologic reflexes  4) Psychological: Awake alert and oriented x3 mood and affect normal  5) Skin no lacerations or ulcerations no nodularity no palpable masses, no erythema or nodularity  6) Musculoskeletal:  Left shoulder painful forward elevation to 90 degrees positive impingement sign no instability strength remains normal she is tender in the posterior subacromial space  Right small finger tenderness at the DIP joint no extension normal flexion joint feels stable obvious extension weakness of the extensor tendon    MEDICAL DECISION SECTION:  Encounter Diagnosis  Name Primary?  . Pain of finger of right hand Yes    Imaging See chart for nerve conduction study which was read as positive moderate disease  X-ray right small finger shows a 50% joint space involved fracture of the DIP joint at the proximal phalanx bony mallet  Plan:  (Rx., Inj., surg., Frx, MRI/CT, XR:2)  Injection left shoulder Procedure note the subacromial injection  shoulder left   Verbal consent was obtained to inject the  Left   Shoulder  Timeout was completed to confirm the injection site is a subacromial space of the  left  shoulder  Medication used Depo-Medrol 40 mg and lidocaine 1% 3 cc  Anesthesia was provided by ethyl chloride  The injection was performed in the left  posterior subacromial space. After pinning the skin with alcohol and anesthetized the skin with ethyl chloride the subacromial space was injected using a  20-gauge needle. There were no complications  Sterile dressing was applied.  Referral to hand surgery for fixation if deemed necessary   Encounter Diagnoses  Name Primary?  . Pain of finger of right hand, bony mallet 50 percent involvement Yes  . Chronic left shoulder pain   . Carpal tunnel syndrome of right wrist       12:20 PM Fuller CanadaStanley Harrison, MD  05/27/2019

## 2019-05-27 NOTE — Patient Instructions (Signed)

## 2019-06-04 DIAGNOSIS — S62636A Displaced fracture of distal phalanx of right little finger, initial encounter for closed fracture: Secondary | ICD-10-CM | POA: Diagnosis not present

## 2019-06-04 DIAGNOSIS — M79644 Pain in right finger(s): Secondary | ICD-10-CM | POA: Diagnosis not present

## 2019-06-09 ENCOUNTER — Other Ambulatory Visit: Payer: Self-pay | Admitting: Nurse Practitioner

## 2019-06-09 DIAGNOSIS — E7800 Pure hypercholesterolemia, unspecified: Secondary | ICD-10-CM

## 2019-06-14 ENCOUNTER — Encounter (HOSPITAL_BASED_OUTPATIENT_CLINIC_OR_DEPARTMENT_OTHER): Payer: Self-pay | Admitting: *Deleted

## 2019-06-14 ENCOUNTER — Other Ambulatory Visit: Payer: Self-pay

## 2019-06-17 ENCOUNTER — Encounter (HOSPITAL_BASED_OUTPATIENT_CLINIC_OR_DEPARTMENT_OTHER)
Admission: RE | Admit: 2019-06-17 | Discharge: 2019-06-17 | Disposition: A | Discharge status: Home / Self Care | Payer: Medicare HMO | Source: Ambulatory Visit | Attending: Orthopedic Surgery | Admitting: Orthopedic Surgery

## 2019-06-17 ENCOUNTER — Other Ambulatory Visit (HOSPITAL_COMMUNITY)
Admission: RE | Admit: 2019-06-17 | Discharge: 2019-06-17 | Disposition: A | Discharge status: Home / Self Care | Payer: Medicare HMO | Source: Ambulatory Visit | Attending: Orthopedic Surgery | Admitting: Orthopedic Surgery

## 2019-06-17 DIAGNOSIS — Z01812 Encounter for preprocedural laboratory examination: Secondary | ICD-10-CM | POA: Insufficient documentation

## 2019-06-17 DIAGNOSIS — I10 Essential (primary) hypertension: Secondary | ICD-10-CM | POA: Diagnosis not present

## 2019-06-17 DIAGNOSIS — Z01818 Encounter for other preprocedural examination: Secondary | ICD-10-CM | POA: Diagnosis not present

## 2019-06-17 DIAGNOSIS — Z79899 Other long term (current) drug therapy: Secondary | ICD-10-CM | POA: Diagnosis not present

## 2019-06-17 DIAGNOSIS — R9431 Abnormal electrocardiogram [ECG] [EKG]: Secondary | ICD-10-CM | POA: Diagnosis not present

## 2019-06-17 DIAGNOSIS — Y9389 Activity, other specified: Secondary | ICD-10-CM | POA: Diagnosis not present

## 2019-06-17 DIAGNOSIS — I447 Left bundle-branch block, unspecified: Secondary | ICD-10-CM | POA: Insufficient documentation

## 2019-06-17 DIAGNOSIS — S92531A Displaced fracture of distal phalanx of right lesser toe(s), initial encounter for closed fracture: Secondary | ICD-10-CM | POA: Diagnosis not present

## 2019-06-17 DIAGNOSIS — X500XXA Overexertion from strenuous movement or load, initial encounter: Secondary | ICD-10-CM | POA: Diagnosis not present

## 2019-06-17 DIAGNOSIS — Z20828 Contact with and (suspected) exposure to other viral communicable diseases: Secondary | ICD-10-CM | POA: Diagnosis not present

## 2019-06-17 DIAGNOSIS — E785 Hyperlipidemia, unspecified: Secondary | ICD-10-CM | POA: Diagnosis not present

## 2019-06-17 LAB — BASIC METABOLIC PANEL
Anion gap: 8 (ref 5–15)
BUN: 13 mg/dL (ref 8–23)
CO2: 27 mmol/L (ref 22–32)
Calcium: 10 mg/dL (ref 8.9–10.3)
Chloride: 104 mmol/L (ref 98–111)
Creatinine, Ser: 0.65 mg/dL (ref 0.44–1.00)
GFR calc Af Amer: >60 mL/min (ref >60)
GFR calc non Af Amer: >60 mL/min (ref >60)
Glucose, Bld: 103 mg/dL — ABNORMAL HIGH (ref 70–99)
Potassium: 4.4 mmol/L (ref 3.5–5.1)
Sodium: 139 mmol/L (ref 135–145)

## 2019-06-17 NOTE — Progress Notes (Signed)

## 2019-06-18 LAB — NOVEL CORONAVIRUS, NAA (HOSP ORDER, SEND-OUT TO REF LAB; TAT 18-24 HRS): SARS-CoV-2, NAA: NOT DETECTED

## 2019-06-19 ENCOUNTER — Other Ambulatory Visit: Payer: Self-pay | Admitting: Orthopedic Surgery

## 2019-06-20 ENCOUNTER — Ambulatory Visit (HOSPITAL_BASED_OUTPATIENT_CLINIC_OR_DEPARTMENT_OTHER): Payer: Medicare HMO | Admitting: Anesthesiology

## 2019-06-20 ENCOUNTER — Ambulatory Visit: Payer: Medicare HMO | Admitting: Nurse Practitioner

## 2019-06-20 ENCOUNTER — Encounter (HOSPITAL_BASED_OUTPATIENT_CLINIC_OR_DEPARTMENT_OTHER): Payer: Self-pay | Admitting: *Deleted

## 2019-06-20 ENCOUNTER — Other Ambulatory Visit: Payer: Self-pay

## 2019-06-20 ENCOUNTER — Encounter (HOSPITAL_BASED_OUTPATIENT_CLINIC_OR_DEPARTMENT_OTHER)
Admission: RE | Disposition: A | Discharge status: Home / Self Care | Payer: Self-pay | Source: Ambulatory Visit | Attending: Orthopedic Surgery

## 2019-06-20 ENCOUNTER — Ambulatory Visit (HOSPITAL_BASED_OUTPATIENT_CLINIC_OR_DEPARTMENT_OTHER)
Admission: RE | Admit: 2019-06-20 | Discharge: 2019-06-20 | Disposition: A | Discharge status: Home / Self Care | Payer: Medicare HMO | Source: Ambulatory Visit | Attending: Orthopedic Surgery | Admitting: Orthopedic Surgery

## 2019-06-20 DIAGNOSIS — Y9389 Activity, other specified: Secondary | ICD-10-CM | POA: Insufficient documentation

## 2019-06-20 DIAGNOSIS — I10 Essential (primary) hypertension: Secondary | ICD-10-CM | POA: Insufficient documentation

## 2019-06-20 DIAGNOSIS — E785 Hyperlipidemia, unspecified: Secondary | ICD-10-CM | POA: Insufficient documentation

## 2019-06-20 DIAGNOSIS — S92531A Displaced fracture of distal phalanx of right lesser toe(s), initial encounter for closed fracture: Secondary | ICD-10-CM | POA: Diagnosis not present

## 2019-06-20 DIAGNOSIS — S62636A Displaced fracture of distal phalanx of right little finger, initial encounter for closed fracture: Secondary | ICD-10-CM | POA: Diagnosis not present

## 2019-06-20 DIAGNOSIS — Z79899 Other long term (current) drug therapy: Secondary | ICD-10-CM | POA: Diagnosis not present

## 2019-06-20 DIAGNOSIS — X500XXA Overexertion from strenuous movement or load, initial encounter: Secondary | ICD-10-CM | POA: Insufficient documentation

## 2019-06-20 DIAGNOSIS — M20011 Mallet finger of right finger(s): Secondary | ICD-10-CM | POA: Diagnosis not present

## 2019-06-20 HISTORY — PX: CLOSED REDUCTION FINGER WITH PERCUTANEOUS PINNING: SHX5612

## 2019-06-20 SURGERY — CLOSED REDUCTION, FINGER, WITH PERCUTANEOUS PINNING
Anesthesia: General | Site: Finger | Laterality: Right

## 2019-06-20 MED ORDER — MIDAZOLAM HCL 2 MG/2ML IJ SOLN: 1.0000 mg | INTRAMUSCULAR | Status: DC | PRN

## 2019-06-20 MED ORDER — ONDANSETRON HCL 4 MG/2ML IJ SOLN: 4.0000 mg | Freq: Once | INTRAMUSCULAR | Status: DC | PRN

## 2019-06-20 MED ORDER — HYDROCODONE-ACETAMINOPHEN 5-325 MG PO TABS: ORAL_TABLET | ORAL | 0 refills | Status: DC

## 2019-06-20 MED ORDER — ONDANSETRON HCL 4 MG/2ML IJ SOLN
INTRAMUSCULAR | Status: DC | PRN
  Administered 2019-06-20: 4 mg via INTRAVENOUS

## 2019-06-20 MED ORDER — LACTATED RINGERS IV SOLN: INTRAVENOUS | Status: DC

## 2019-06-20 MED ORDER — CEFAZOLIN SODIUM-DEXTROSE 2-4 GM/100ML-% IV SOLN: 2.0000 g | INTRAVENOUS | Status: DC

## 2019-06-20 MED ORDER — ACETAMINOPHEN 10 MG/ML IV SOLN: 1000.0000 mg | Freq: Once | INTRAVENOUS | Status: DC | PRN

## 2019-06-20 MED ORDER — DEXAMETHASONE SODIUM PHOSPHATE 10 MG/ML IJ SOLN
INTRAMUSCULAR | Status: DC | PRN
  Administered 2019-06-20: 10 mg via INTRAVENOUS

## 2019-06-20 MED ORDER — BUPIVACAINE HCL (PF) 0.25 % IJ SOLN
INTRAMUSCULAR | Status: DC | PRN
  Administered 2019-06-20: 10 mL

## 2019-06-20 MED ORDER — ACETAMINOPHEN 325 MG PO TABS: 325.0000 mg | ORAL_TABLET | Freq: Once | ORAL | Status: DC | PRN

## 2019-06-20 MED ORDER — SCOPOLAMINE 1 MG/3DAYS TD PT72: 1.0000 | MEDICATED_PATCH | Freq: Once | TRANSDERMAL | Status: DC

## 2019-06-20 MED ORDER — CEFAZOLIN SODIUM-DEXTROSE 2-4 GM/100ML-% IV SOLN
INTRAVENOUS | Status: AC
  Filled 2019-06-20: qty 100

## 2019-06-20 MED ORDER — CHLORHEXIDINE GLUCONATE 4 % EX LIQD: 60.0000 mL | Freq: Once | CUTANEOUS | Status: DC

## 2019-06-20 MED ORDER — FENTANYL CITRATE (PF) 100 MCG/2ML IJ SOLN: 25.0000 ug | INTRAMUSCULAR | Status: DC | PRN

## 2019-06-20 MED ORDER — MEPERIDINE HCL 25 MG/ML IJ SOLN: 6.2500 mg | INTRAMUSCULAR | Status: DC | PRN

## 2019-06-20 MED ORDER — ONDANSETRON HCL 4 MG/2ML IJ SOLN
INTRAMUSCULAR | Status: AC
  Filled 2019-06-20: qty 2

## 2019-06-20 MED ORDER — LIDOCAINE 2% (20 MG/ML) 5 ML SYRINGE
INTRAMUSCULAR | Status: AC
  Filled 2019-06-20: qty 5

## 2019-06-20 MED ORDER — FENTANYL CITRATE (PF) 100 MCG/2ML IJ SOLN
INTRAMUSCULAR | Status: AC
  Filled 2019-06-20: qty 2

## 2019-06-20 MED ORDER — OXYCODONE HCL 5 MG PO TABS: 5.0000 mg | ORAL_TABLET | Freq: Once | ORAL | Status: DC | PRN

## 2019-06-20 MED ORDER — OXYCODONE HCL 5 MG/5ML PO SOLN: 5.0000 mg | Freq: Once | ORAL | Status: DC | PRN

## 2019-06-20 MED ORDER — DEXAMETHASONE SODIUM PHOSPHATE 10 MG/ML IJ SOLN
INTRAMUSCULAR | Status: AC
  Filled 2019-06-20: qty 1

## 2019-06-20 MED ORDER — FENTANYL CITRATE (PF) 100 MCG/2ML IJ SOLN
50.0000 ug | INTRAMUSCULAR | Status: DC | PRN
  Administered 2019-06-20: 50 ug via INTRAVENOUS

## 2019-06-20 MED ORDER — ACETAMINOPHEN 160 MG/5ML PO SOLN: 325.0000 mg | Freq: Once | ORAL | Status: DC | PRN

## 2019-06-20 MED ORDER — LIDOCAINE 2% (20 MG/ML) 5 ML SYRINGE
INTRAMUSCULAR | Status: DC | PRN
  Administered 2019-06-20: 60 mg via INTRAVENOUS

## 2019-06-20 MED ORDER — LACTATED RINGERS IV SOLN
INTRAVENOUS | Status: DC
  Administered 2019-06-20 (×2): via INTRAVENOUS

## 2019-06-20 MED ORDER — PROMETHAZINE HCL 25 MG/ML IJ SOLN: 6.2500 mg | INTRAMUSCULAR | Status: DC | PRN

## 2019-06-20 MED ORDER — PROPOFOL 10 MG/ML IV BOLUS
INTRAVENOUS | Status: DC | PRN
  Administered 2019-06-20: 150 mg via INTRAVENOUS

## 2019-06-20 SURGICAL SUPPLY — 45 items
BLADE SURG 15 STRL LF DISP TIS (BLADE) ×2 IMPLANT
BLADE SURG 15 STRL SS (BLADE) ×2
BNDG ELASTIC 2X5.8 VLCR STR LF (GAUZE/BANDAGES/DRESSINGS) IMPLANT
BNDG ELASTIC 3X5.8 VLCR STR LF (GAUZE/BANDAGES/DRESSINGS) ×2 IMPLANT
BNDG ESMARK 4X9 LF (GAUZE/BANDAGES/DRESSINGS) ×2 IMPLANT
BNDG GAUZE ELAST 4 BULKY (GAUZE/BANDAGES/DRESSINGS) ×2 IMPLANT
CHLORAPREP W/TINT 26 (MISCELLANEOUS) ×2 IMPLANT
CORD BIPOLAR FORCEPS 12FT (ELECTRODE) ×1 IMPLANT
COVER BACK TABLE REUSABLE LG (DRAPES) ×2 IMPLANT
COVER MAYO STAND REUSABLE (DRAPES) ×2 IMPLANT
COVER WAND RF STERILE (DRAPES) IMPLANT
CUFF TOURN SGL QUICK 18X4 (TOURNIQUET CUFF) ×2 IMPLANT
DRAPE EXTREMITY T 121X128X90 (DISPOSABLE) ×2 IMPLANT
DRAPE OEC MINIVIEW 54X84 (DRAPES) ×2 IMPLANT
DRAPE SURG 17X23 STRL (DRAPES) ×2 IMPLANT
GAUZE SPONGE 4X4 12PLY STRL (GAUZE/BANDAGES/DRESSINGS) ×2 IMPLANT
GAUZE XEROFORM 1X8 LF (GAUZE/BANDAGES/DRESSINGS) ×2 IMPLANT
GLOVE BIO SURGEON STRL SZ7.5 (GLOVE) ×2 IMPLANT
GLOVE BIOGEL PI IND STRL 7.0 (GLOVE) IMPLANT
GLOVE BIOGEL PI IND STRL 8 (GLOVE) ×1 IMPLANT
GLOVE BIOGEL PI IND STRL 8.5 (GLOVE) IMPLANT
GLOVE BIOGEL PI INDICATOR 7.0 (GLOVE) ×3
GLOVE BIOGEL PI INDICATOR 8 (GLOVE) ×1
GLOVE BIOGEL PI INDICATOR 8.5 (GLOVE) ×1
GLOVE ECLIPSE 6.5 STRL STRAW (GLOVE) ×2 IMPLANT
GLOVE SURG ORTHO 8.0 STRL STRW (GLOVE) ×1 IMPLANT
GOWN STRL REUS W/ TWL LRG LVL3 (GOWN DISPOSABLE) ×1 IMPLANT
GOWN STRL REUS W/TWL LRG LVL3 (GOWN DISPOSABLE) ×2
NDL HYPO 25X1 1.5 SAFETY (NEEDLE) IMPLANT
NEEDLE HYPO 25X1 1.5 SAFETY (NEEDLE) ×2 IMPLANT
NS IRRIG 1000ML POUR BTL (IV SOLUTION) ×2 IMPLANT
PACK BASIN DAY SURGERY FS (CUSTOM PROCEDURE TRAY) ×2 IMPLANT
PAD CAST 3X4 CTTN HI CHSV (CAST SUPPLIES) IMPLANT
PAD CAST 4YDX4 CTTN HI CHSV (CAST SUPPLIES) IMPLANT
PADDING CAST COTTON 3X4 STRL (CAST SUPPLIES) ×1
PADDING CAST COTTON 4X4 STRL (CAST SUPPLIES)
SLEEVE SCD COMPRESS KNEE MED (MISCELLANEOUS) ×1 IMPLANT
SPLINT FINGER 3.25 911903 (SOFTGOODS) ×1 IMPLANT
STOCKINETTE 4X48 STRL (DRAPES) ×2 IMPLANT
SUT ETHILON 3 0 PS 1 (SUTURE) IMPLANT
SUT ETHILON 4 0 PS 2 18 (SUTURE) IMPLANT
SYR BULB 3OZ (MISCELLANEOUS) IMPLANT
SYR CONTROL 10ML LL (SYRINGE) ×1 IMPLANT
TOWEL GREEN STERILE FF (TOWEL DISPOSABLE) ×4 IMPLANT
UNDERPAD 30X36 HEAVY ABSORB (UNDERPADS AND DIAPERS) ×2 IMPLANT

## 2019-06-20 NOTE — Op Note (Signed)
I assisted Surgeon(s) and Role:    * Leanora Cover, MD - Primary    Daryll Brod, MD - Assisting on the Procedure(s): RIGHT SMALL FINGER CLOSED REDUCTION PIN FIXATION on 06/20/2019.  I provided assistance on this case as follows: setup, manipulation, reduction, stabilization, fixation and application of the dressings and splint.  Electronically signed by: Daryll Brod, MD Date: 06/20/2019 Time: 11:43 AM

## 2019-06-20 NOTE — Anesthesia Postprocedure Evaluation (Signed)
Anesthesia Post Note  Patient: Ariel [name]  Procedure(s) Performed: RIGHT SMALL FINGER CLOSED REDUCTION PIN FIXATION (Right Finger)     Patient location during evaluation: PACU Anesthesia Type: General Level of consciousness: awake and alert Pain management: pain level controlled Vital Signs Assessment: post-procedure vital signs reviewed and stable Respiratory status: spontaneous breathing, nonlabored ventilation, respiratory function stable and patient connected to nasal cannula oxygen Cardiovascular status: blood pressure returned to baseline and stable Postop Assessment: no apparent nausea or vomiting Anesthetic complications: no    Last Vitals:  Vitals:   06/20/19 1215 06/20/19 1230  BP: 113/72 121/72  Pulse: 72 64  Resp: 18 19  Temp:    SpO2: 99% 99%    Last Pain:  Vitals:   06/20/19 1230  TempSrc:   PainSc: 0-No pain                 Effie Berkshire

## 2019-06-20 NOTE — H&P (Signed)
  Ariel [name] is an 73 y.o. female.   Chief Complaint: right small finger bony mallet HPI: 73 yo female injured right small finger moving furniture 4 weeks ago.  Unable to extend dip joint.  XR show bony mallet injury.  She wishes to proceed with operative fixation.  Allergies: No Known Allergies  Past Medical History:  Diagnosis Date  . Arthritis   . Diverticulosis   . Hyperlipidemia   . Hypertension   . Osteopenia     Past Surgical History:  Procedure Laterality Date  . ABDOMINAL HYSTERECTOMY  1985  . CARPAL TUNNEL RELEASE Right 2007  . CARPAL TUNNEL RELEASE Left 03/17/2016   Procedure: LEFT CARPAL TUNNEL RELEASE;  Surgeon: Carole Civil, MD;  Location: [facility];  Service: Orthopedics;  Laterality: Left;    Family History: Family History  Problem Relation Age of Onset  . Diabetes Mother   . Cancer Father 53       colon  . Alcohol abuse Brother   . Cancer Brother        stomach  . Lupus Brother     Social History:   reports that she has never smoked. She has never used smokeless tobacco. She reports current alcohol use. She reports that she does not use drugs.  Medications: Medications Prior to Admission  Medication Sig Dispense Refill  . HYDROcodone-acetaminophen (NORCO/VICODIN) 5-325 MG tablet Take 1 tablet by mouth every 6 (six) hours as needed for moderate pain. 30 tablet 0  . rosuvastatin (CRESTOR) 10 MG tablet TAKE 1 TABLET (10 MG TOTAL) BY MOUTH DAILY. (NEEDS TO BE SEEN BEFORE NEXT REFILL) 30 tablet 0  . valsartan-hydrochlorothiazide (DIOVAN-HCT) 160-25 MG tablet Take 0.5 tablets by mouth daily. 90 tablet 0    No results found for this or any previous visit (from the past 48 hour(s)).  No results found.   A comprehensive review of systems was negative.  Blood pressure 136/68, pulse 83, temperature 98 F (36.7 C), temperature source Oral, resp. rate 20, height 5\' 4"  (1.626 m), weight 84.5 kg, SpO2 100 %.  General appearance: alert, cooperative and  appears stated age Head: Normocephalic, without obvious abnormality, atraumatic Neck: supple, symmetrical, trachea midline Cardio: regular rate and rhythm Resp: clear to auscultation bilaterally Extremities: Intact sensation and capillary refill all digits.  +epl/fpl/io.  No wounds.  Pulses: 2+ and symmetric Skin: Skin color, texture, turgor normal. No rashes or lesions Neurologic: Grossly normal Incision/Wound: none  Assessment/Plan Right small finger bony mallet injury.  Plan CRPP vs open reduction and fixation.  Non operative and operative treatment options have been discussed with the patient and patient wishes to proceed with operative treatment. Risks, benefits, and alternatives of surgery have been discussed and the patient agrees with the plan of care.   Ariel [name] 06/20/2019, 10:55 AM

## 2019-06-20 NOTE — Anesthesia Procedure Notes (Signed)
Procedure Name: LMA Insertion Date/Time: 06/20/2019 11:08 AM Performed by: Maryella Shivers, CRNA Pre-anesthesia Checklist: Patient identified, Emergency Drugs available, Suction available and Patient being monitored Patient Re-evaluated:Patient Re-evaluated prior to induction Oxygen Delivery Method: Circle system utilized Preoxygenation: Pre-oxygenation with 100% oxygen Induction Type: IV induction Ventilation: Mask ventilation without difficulty LMA: LMA inserted LMA Size: 4.0 Number of attempts: 1 Airway Equipment and Method: Bite block Placement Confirmation: positive ETCO2 Tube secured with: Tape Dental Injury: Teeth and Oropharynx as per pre-operative assessment

## 2019-06-20 NOTE — Anesthesia Preprocedure Evaluation (Addendum)
Anesthesia Evaluation  Patient identified by MRN, date of birth, ID band Patient awake    Reviewed: Allergy & Precautions, NPO status , Patient's Chart, lab work & pertinent test results  Airway Mallampati: II  TM Distance: >3 FB     Dental  (+) Upper Dentures, Lower Dentures   Pulmonary neg pulmonary ROS,    breath sounds clear to auscultation       Cardiovascular hypertension, Pt. on medications  Rhythm:Regular Rate:Normal     Neuro/Psych  Neuromuscular disease    GI/Hepatic   Endo/Other    Renal/GU      Musculoskeletal  (+) Arthritis ,   Abdominal   Peds  Hematology   Anesthesia Other Findings   Reproductive/Obstetrics                            Anesthesia Physical  Anesthesia Plan  ASA: II  Anesthesia Plan: General   Post-op Pain Management:    Induction: Intravenous  PONV Risk Score and Plan: 4 or greater and Ondansetron, Dexamethasone and Treatment may vary due to age or medical condition  Airway Management Planned: LMA  Additional Equipment:   Intra-op Plan:   Post-operative Plan: Extubation in OR  Informed Consent: I have reviewed the patients History and Physical, chart, labs and discussed the procedure including the risks, benefits and alternatives for the proposed anesthesia with the patient or authorized representative who has indicated his/her understanding and acceptance.     Dental advisory given  Plan Discussed with: CRNA  Anesthesia Plan Comments:       Anesthesia Quick Evaluation

## 2019-06-20 NOTE — Transfer of Care (Signed)
Immediate Anesthesia Transfer of Care Note  Patient: Rosalea Green  Procedure(s) Performed: RIGHT SMALL FINGER CLOSED REDUCTION PIN FIXATION (Right Finger)  Patient Location: PACU  Anesthesia Type:General  Level of Consciousness: awake, alert , oriented and drowsy  Airway & Oxygen Therapy: Patient Spontanous Breathing and Patient connected to face mask oxygen  Post-op Assessment: Report given to RN and Post -op Vital signs reviewed and stable  Post vital signs: Reviewed and stable  Last Vitals:  Vitals Value Taken Time  BP    Temp    Pulse 86 06/20/19 1144  Resp 7 06/20/19 1144  SpO2 98 % 06/20/19 1144  Vitals shown include unvalidated device data.  Last Pain:  Vitals:   06/20/19 0930  TempSrc: Oral  PainSc: 2       Patients Stated Pain Goal: 2 (51/88/41 6606)  Complications: No apparent anesthesia complications

## 2019-06-20 NOTE — Discharge Instructions (Addendum)

## 2019-06-20 NOTE — Op Note (Signed)
NAME: Ariel [name] MEDICAL RECORD NO: [id number] DATE OF BIRTH: 02-01-1946 FACILITY: [facility] LOCATION: [facility] PHYSICIAN: Tennis Must, MD   OPERATIVE REPORT   DATE OF PROCEDURE: 06/20/19    PREOPERATIVE DIAGNOSIS:   Right small finger bony mallet injury   POSTOPERATIVE DIAGNOSIS:   Right small finger bony mallet injury   PROCEDURE:   Closed reduction pin fixation right small finger bony mallet injury   SURGEON:  Leanora Cover, M.D.   ASSISTANT: Daryll Brod, MD   ANESTHESIA:  General   INTRAVENOUS FLUIDS:  Per anesthesia flow sheet.   ESTIMATED BLOOD LOSS:  Minimal.   COMPLICATIONS:  None.   SPECIMENS:  none   TOURNIQUET TIME:    Total Tourniquet Time Documented: Upper Arm (Right) - 14 minutes Total: Upper Arm (Right) - 14 minutes    DISPOSITION:  Stable to PACU.   INDICATIONS: 73 year old female states approximate 4 weeks ago she injured her right small finger while moving furniture.  Radiographs show a bony mallet injury.  She wishes to undergo operative reduction and fixation. Risks, benefits and alternatives of surgery were discussed including the risks of blood loss, infection, damage to nerves, vessels, tendons, ligaments, bone for surgery, need for additional surgery, complications with wound healing, continued pain, nonunion, malunion, stiffness.  She voiced understanding of these risks and elected to proceed.  OPERATIVE COURSE:  After being identified preoperatively by myself,  the patient and I agreed on the procedure and site of the procedure.  The surgical site was marked.  Surgical consent had been signed. She was given IV antibiotics as preoperative antibiotic prophylaxis. She was transferred to the operating room and placed on the operating table in supine position with the Right upper extremity on an arm board.  General anesthesia was induced by the anesthesiologist.  Right upper extremity was prepped and draped in normal sterile orthopedic  fashion.  A surgical pause was performed between the surgeons, anesthesia, and operating room staff and all were in agreement as to the patient, procedure, and site of procedure.  Tourniquet at the proximal aspect of the extremity was inflated to 250 mmHg after exsanguination of the arm with an Esmarch bandage.    Close reduction of the right small finger bony mallet injury was performed.  C-arm was used in AP and lateral projections throughout the case to aid in reduction.  A 0.035 inch K wire was placed through the dorsum of the finger to aid in reduction of the dorsal bony fragment.  This provided adequate reduction.  The dorsal pin was then advanced as a blocking pin across the middle phalanx.  A longitudinal 0.035 inch K wires then advanced from the tip of the finger across the DIP joint.  This was adequate to stabilize the finger and good position with acceptable reduction of the fragment.  C-arm was again used in AP and lateral projections to ensure appropriate reduction position of heart which was the case.  The pins were bent and cut short.  Pin sites were dressed with sterile Xeroform 4 x 4 and wrapped with a Coban dressing lightly.  AlumaFoam splint was placed and wrapped lightly with Coban dressing.  A digital block was performed with quarter percent plain Marcaine to aid in postoperative analgesia.  The tourniquet was deflated at 14 minutes.  Fingertips were pink with brisk capillary refill after deflation of tourniquet.  The operative  drapes were broken down.  The patient was awoken from anesthesia safely.  She was transferred  back to the stretcher and taken to PACU in stable condition.  I will see her back in the office in 1 week for postoperative followup.  I will give her a prescription for Norco 5/325 1-2 tabs PO q6 hours prn pain, dispense # 20.   Ariel LoaKevin Arlington Sigmund, MD Electronically signed, 06/20/19

## 2019-06-21 NOTE — Op Note (Signed)
Intra-operative fluoroscopic images in the AP, lateral, and oblique views were taken and evaluated by myself.  Reduction and hardware placement were confirmed.  There was no intraarticular penetration of permanent hardware. There is acceptable reduction of the fracture.

## 2019-06-24 ENCOUNTER — Encounter (HOSPITAL_BASED_OUTPATIENT_CLINIC_OR_DEPARTMENT_OTHER): Payer: Self-pay | Admitting: Orthopedic Surgery

## 2019-06-25 ENCOUNTER — Ambulatory Visit (INDEPENDENT_AMBULATORY_CARE_PROVIDER_SITE_OTHER): Payer: Medicare HMO | Admitting: Nurse Practitioner

## 2019-06-25 ENCOUNTER — Encounter: Payer: Self-pay | Admitting: Nurse Practitioner

## 2019-06-25 ENCOUNTER — Other Ambulatory Visit: Payer: Self-pay

## 2019-06-25 VITALS — BP 119/78 | HR 73 | Temp 97.2°F | Ht 64.0 in | Wt 186.0 lb

## 2019-06-25 DIAGNOSIS — R351 Nocturia: Secondary | ICD-10-CM

## 2019-06-25 DIAGNOSIS — E8881 Metabolic syndrome: Secondary | ICD-10-CM

## 2019-06-25 DIAGNOSIS — E7800 Pure hypercholesterolemia, unspecified: Secondary | ICD-10-CM | POA: Diagnosis not present

## 2019-06-25 DIAGNOSIS — I10 Essential (primary) hypertension: Secondary | ICD-10-CM

## 2019-06-25 DIAGNOSIS — N3281 Overactive bladder: Secondary | ICD-10-CM

## 2019-06-25 DIAGNOSIS — Z6833 Body mass index (BMI) 33.0-33.9, adult: Secondary | ICD-10-CM

## 2019-06-25 DIAGNOSIS — E6609 Other obesity due to excess calories: Secondary | ICD-10-CM

## 2019-06-25 DIAGNOSIS — R6889 Other general symptoms and signs: Secondary | ICD-10-CM | POA: Diagnosis not present

## 2019-06-25 MED ORDER — MIRABEGRON ER 25 MG PO TB24: 25.0000 mg | ORAL_TABLET | Freq: Every day | ORAL | 1 refills | Status: DC

## 2019-06-25 MED ORDER — ROSUVASTATIN CALCIUM 10 MG PO TABS: ORAL_TABLET | ORAL | 1 refills | Status: DC

## 2019-06-25 MED ORDER — VALSARTAN-HYDROCHLOROTHIAZIDE 160-25 MG PO TABS: 0.5000 | ORAL_TABLET | Freq: Every day | ORAL | 1 refills | Status: DC

## 2019-06-25 NOTE — Patient Instructions (Signed)

## 2019-06-25 NOTE — Progress Notes (Signed)
Subjective:    Patient ID: Ariel [name], female    DOB: 06-12-1946, 73 y.o.   MRN: [id number]   Chief Complaint: Medical Management of Chronic Issues    HPI:  1. Essential hypertension No c/o chest pain, sob or headache. Does not check blood pressure at home. BP Readings from Last 3 Encounters:  06/25/19 119/78  06/20/19 121/75  05/27/19 121/64     2. Pure hypercholesterolemia Does not watch diet and does very little exercise. Lab Results  Component Value Date   CHOL 140 07/31/2018   HDL 45 07/31/2018   LDLCALC 67 07/31/2018   TRIG 139 07/31/2018   CHOLHDL 3.1 07/31/2018     3. Metabolic syndrome Patient does not check blood sugars at home. Lab Results  Component Value Date   HGBA1C 6.2 07/31/2018     4. Overactive bladder Gets up 3-4 times at night to go to the restroom  5. Nocturia Falls asleep easily at night. Denies any sleeping during the day time  6. Class 1 obesity due to excess calories with serious comorbidity and body mass index (BMI) of 33.0 to 33.9 in adult No recent weight chnages Wt Readings from Last 3 Encounters:  06/25/19 186 lb (84.4 kg)  06/20/19 186 lb 4.6 oz (84.5 kg)  05/27/19 180 lb (81.6 kg)   BMI Readings from Last 3 Encounters:  06/25/19 31.93 kg/m  06/20/19 31.98 kg/m  05/27/19 30.90 kg/m       Outpatient Encounter Medications as of 06/25/2019  Medication Sig  . rosuvastatin (CRESTOR) 10 MG tablet TAKE 1 TABLET (10 MG TOTAL) BY MOUTH DAILY. (NEEDS TO BE SEEN BEFORE NEXT REFILL)  . valsartan-hydrochlorothiazide (DIOVAN-HCT) 160-25 MG tablet Take 0.5 tablets by mouth daily.     Past Surgical History:  Procedure Laterality Date  . ABDOMINAL HYSTERECTOMY  1985  . CARPAL TUNNEL RELEASE Right 2007  . CARPAL TUNNEL RELEASE Left 03/17/2016   Procedure: LEFT CARPAL TUNNEL RELEASE;  Surgeon: Carole Civil, MD;  Location: [facility];  Service: Orthopedics;  Laterality: Left;  . CLOSED REDUCTION FINGER WITH PERCUTANEOUS  PINNING Right 06/20/2019   Procedure: RIGHT SMALL FINGER CLOSED REDUCTION PIN FIXATION;  Surgeon: Leanora Cover, MD;  Location: [facility];  Service: Orthopedics;  Laterality: Right;    Family History  Problem Relation Age of Onset  . Diabetes Mother   . Cancer Father 44       colon  . Alcohol abuse Brother   . Cancer Brother        stomach  . Lupus Brother     New complaints: Broke her right 5th finger moving furniture seveal weeks ago and had to have surgery  Social history: Lives with her boyfriend  Controlled substance contract: n/a    Review of Systems  Constitutional: Negative for activity change and appetite change.  HENT: Negative.   Eyes: Negative for pain.  Respiratory: Negative for shortness of breath.   Cardiovascular: Negative for chest pain, palpitations and leg swelling.  Gastrointestinal: Negative for abdominal pain.  Endocrine: Negative for polydipsia.  Genitourinary: Negative.   Skin: Negative for rash.  Neurological: Negative for dizziness, weakness and headaches.  Hematological: Does not bruise/bleed easily.  Psychiatric/Behavioral: Negative.   All other systems reviewed and are negative.      Objective:   Physical Exam Vitals signs and nursing note reviewed.  Constitutional:      General: She is not in acute distress.    Appearance: Normal appearance. She is well-developed.  HENT:     Head: Normocephalic.     Nose: Nose normal.  Eyes:     Pupils: Pupils are equal, round, and reactive to light.  Neck:     Musculoskeletal: Normal range of motion and neck supple.     Vascular: No carotid bruit or JVD.  Cardiovascular:     Rate and Rhythm: Normal rate and regular rhythm.     Heart sounds: Normal heart sounds.  Pulmonary:     Effort: Pulmonary effort is normal. No respiratory distress.     Breath sounds: Normal breath sounds. No wheezing or rales.  Chest:     Chest wall: No tenderness.  Abdominal:     General: Bowel sounds  are normal. There is no distension or abdominal bruit.     Palpations: Abdomen is soft. There is no hepatomegaly, splenomegaly, mass or pulsatile mass.     Tenderness: There is no abdominal tenderness.  Musculoskeletal: Normal range of motion.  Lymphadenopathy:     Cervical: No cervical adenopathy.  Skin:    General: Skin is warm and dry.  Neurological:     Mental Status: She is alert and oriented to person, place, and time.     Deep Tendon Reflexes: Reflexes are normal and symmetric.  Psychiatric:        Behavior: Behavior normal.        Thought Content: Thought content normal.        Judgment: Judgment normal.    BP 119/78   Pulse 73   Temp (!) 97.2 F (36.2 C) (Temporal)   Ht 5' 4" (1.626 m)   Wt 186 lb (84.4 kg)   SpO2 98%   BMI 31.93 kg/m        Assessment & Plan:  [name] comes in today with chief complaint of Medical Management of Chronic Issues   Diagnosis and orders addressed:  1. Essential hypertension Low sodium diet - valsartan-hydrochlorothiazide (DIOVAN-HCT) 160-25 MG tablet; Take 0.5 tablets by mouth daily.  Dispense: 90 tablet; Refill: 1 - CMP14+EGFR  2. Pure hypercholesterolemia Low fat diet - rosuvastatin (CRESTOR) 10 MG tablet; TAKE 1 TABLET (10 MG TOTAL) BY MOUTH DAILY. (NEEDS TO BE SEEN BEFORE NEXT REFILL)  Dispense: 90 tablet; Refill: 1 - Lipid panel  3. Metabolic syndrome Watch carbs in diet - Bayer DCA Hb A1c Waived  4. Overactive bladder Added myrbetriq today - mirabegron ER (MYRBETRIQ) 25 MG TB24 tablet; Take 1 tablet (25 mg total) by mouth daily.  Dispense: 90 tablet; Refill: 1  5. Nocturia  6. Class 1 obesity due to excess calories with serious comorbidity and body mass index (BMI) of 33.0 to 33.9 in adult Discussed diet and exercise for person with BMI >25 Will recheck weight in 3-6 months   Labs pending Health Maintenance reviewed Diet and exercise encouraged  Follow up plan: 3 months   Mary-Margaret Hassell Done, FNP

## 2019-06-26 DIAGNOSIS — S62636A Displaced fracture of distal phalanx of right little finger, initial encounter for closed fracture: Secondary | ICD-10-CM | POA: Diagnosis not present

## 2019-06-26 DIAGNOSIS — M79644 Pain in right finger(s): Secondary | ICD-10-CM | POA: Diagnosis not present

## 2019-07-02 ENCOUNTER — Other Ambulatory Visit: Payer: Self-pay

## 2019-07-02 DIAGNOSIS — Z1231 Encounter for screening mammogram for malignant neoplasm of breast: Secondary | ICD-10-CM | POA: Diagnosis not present

## 2019-07-02 DIAGNOSIS — R6889 Other general symptoms and signs: Secondary | ICD-10-CM | POA: Diagnosis not present

## 2019-07-04 ENCOUNTER — Telehealth: Payer: Self-pay | Admitting: Orthopedic Surgery

## 2019-07-04 NOTE — Telephone Encounter (Signed)
Patient called to ask if Dr Aline Brochure can prescribe medication for her shoulder pain. Relayed to patient that, per Dr Ruthe Mannan review, she was referred to Dr Fredna Dow; therefore, currently under Dr Levell July care. Patient voiced understanding; confirmed that she did have surgery by Dr Fredna Dow, and that her hand is doing well. Michela Pitcher will call their office with medication request.

## 2019-07-08 ENCOUNTER — Other Ambulatory Visit: Payer: Self-pay | Admitting: *Deleted

## 2019-07-08 MED ORDER — BD SWAB SINGLE USE REGULAR PADS: MEDICATED_PAD | 3 refills | Status: DC

## 2019-07-08 MED ORDER — ACCU-CHEK AVIVA VI SOLN: 0 refills | Status: DC

## 2019-07-10 ENCOUNTER — Other Ambulatory Visit: Payer: Self-pay | Admitting: *Deleted

## 2019-07-10 DIAGNOSIS — N3281 Overactive bladder: Secondary | ICD-10-CM

## 2019-07-10 DIAGNOSIS — E7800 Pure hypercholesterolemia, unspecified: Secondary | ICD-10-CM

## 2019-07-10 DIAGNOSIS — S62636A Displaced fracture of distal phalanx of right little finger, initial encounter for closed fracture: Secondary | ICD-10-CM | POA: Diagnosis not present

## 2019-07-10 MED ORDER — MIRABEGRON ER 25 MG PO TB24: 25.0000 mg | ORAL_TABLET | Freq: Every day | ORAL | 1 refills | Status: DC

## 2019-07-10 MED ORDER — ROSUVASTATIN CALCIUM 10 MG PO TABS: 10.0000 mg | ORAL_TABLET | Freq: Every day | ORAL | 1 refills | Status: DC

## 2019-07-10 MED ORDER — ACCU-CHEK FASTCLIX LANCETS MISC: 3 refills | Status: DC

## 2019-07-10 MED ORDER — ACCU-CHEK AVIVA PLUS W/DEVICE KIT: PACK | 0 refills | Status: DC

## 2019-07-10 MED ORDER — ACCU-CHEK AVIVA PLUS VI STRP: ORAL_STRIP | 3 refills | Status: DC

## 2019-07-24 DIAGNOSIS — S62636A Displaced fracture of distal phalanx of right little finger, initial encounter for closed fracture: Secondary | ICD-10-CM | POA: Diagnosis not present

## 2019-07-29 DIAGNOSIS — M25641 Stiffness of right hand, not elsewhere classified: Secondary | ICD-10-CM | POA: Diagnosis not present

## 2019-08-07 DIAGNOSIS — S62636A Displaced fracture of distal phalanx of right little finger, initial encounter for closed fracture: Secondary | ICD-10-CM | POA: Diagnosis not present

## 2019-08-12 ENCOUNTER — Ambulatory Visit: Payer: Medicare HMO | Admitting: Orthopedic Surgery

## 2019-08-26 ENCOUNTER — Other Ambulatory Visit: Payer: Self-pay

## 2019-08-26 ENCOUNTER — Ambulatory Visit (INDEPENDENT_AMBULATORY_CARE_PROVIDER_SITE_OTHER): Payer: Medicare HMO | Admitting: Orthopedic Surgery

## 2019-08-26 ENCOUNTER — Encounter: Payer: Self-pay | Admitting: Orthopedic Surgery

## 2019-08-26 VITALS — BP 125/77 | HR 91 | Ht 64.0 in | Wt 179.0 lb

## 2019-08-26 DIAGNOSIS — G8929 Other chronic pain: Secondary | ICD-10-CM | POA: Diagnosis not present

## 2019-08-26 DIAGNOSIS — M25512 Pain in left shoulder: Secondary | ICD-10-CM

## 2019-08-26 DIAGNOSIS — R6889 Other general symptoms and signs: Secondary | ICD-10-CM | POA: Diagnosis not present

## 2019-08-26 MED ORDER — HYDROCODONE-ACETAMINOPHEN 5-325 MG PO TABS: 1.0000 | ORAL_TABLET | Freq: Four times a day (QID) | ORAL | 0 refills | Status: DC | PRN

## 2019-08-26 NOTE — Patient Instructions (Signed)
You have been scheduled for an MRI scan We will call your insurance company to do a precertification to get the MRI covered You will receive a phone call regarding the date of the scanYou have received an injection of steroids into the joint. 15% of patients will have increased pain within the 24 hours postinjection.   This is transient and will go away.   We recommend that you use ice packs on the injection site for 20 minutes every 2 hours and extra strength Tylenol 2 tablets every 8 as needed until the pain resolves.  If you continue to have pain after taking the Tylenol and using the ice please call the office for further instructions.  What You Need to Know About Prescription Opioid Pain Medicine        Please be advised. You are on a medication which is classified as an "opiod". The CDC the [facility]  has recently advised all providers to advise patient's that these medications have certain risks which include but are not limited to:    drug intolerance  drug addiction  respiratory depression   respiratory failure  Death  Please keep these medications locked away. If you feel that you are becoming addicted to these medicines or you are having difficulties with these medications please alert your provider.   As your provider I will attempt to wean you off of these medications when you're severe acute pain has been taking care of. However, if we cannot wean you off of this medication you will be sent to a pain management center where they can better manage chronic pain   Opioids are powerful medicines that are used to treat moderate to severe pain. Opioids should be taken with the supervision of a trained health care provider. They should be taken for the shortest period of time as possible. This is because opioids can be addictive and the longer you take opioids, the greater your risk of addiction (opioid use disorder). What do opioids do? Opioids help  reduce or eliminate pain. When used for short periods of time, they can help you:  Sleep better.  Do better in physical or occupational therapy.  Feel better in the first few days after an injury.  Recover from surgery. What kind of problems can opioids cause? Opioids can cause side effects, such as:  Constipation.  Nausea.  Vomiting.  Drowsiness.  Confusion.  Opioid use disorder.  Breathing difficulties (respiratory depression). Using opioid pain medicines for longer than 3 days increases your risk of these side effects. Taking opioid pain medicine for a long period of time can affect your ability to do daily tasks. It also puts you at risk for:  Car accidents.  Heart attack.  Overdose, which can sometimes lead to death. What can increase my risk for developing problems while taking opioids? You may be at an especially high risk for problems while taking opioids if you:  Are over the age of 75.  Are pregnant.  Have kidney or liver disease.  Have certain mental health conditions, such as depression or anxiety.  Have a history of substance use disorder.  Have had an opioid overdose in the past. How do I stop taking opioids if I have been taking them for a long time? If you have been taking opioid medicine for more than a few weeks, you may need to slowly stop taking them (taper). Tapering your use of opioids can decrease your chances of experiencing withdrawal symptoms, such as:  Abdominal pain and cramping.  Nausea.  Sweating.  Sleepiness.  Restlessness.  Uncontrollable shaking (tremors).  Cravings for the medicine. Do not attempt to taper your use of opioids on your own. Talk with your health care provider about how to do this. Your health care provider may prescribe a step-down schedule based on how much medicine you are taking and how long you have been taking it. What are the benefits of stopping the use of opioids? By switching from opioid pain  medicine to non-opioid pain management options, you will decrease your risk of accidents and injuries associated with long-term opioid use. You will also be able to:  Monitor your pain more accurately and know when to seek medical care if it is not improving.  Decrease risk to others around you. Having opioids in the home increases the risk for accidental or intentional use or overdose by others. How can I treat pain without opioids? Pain can be managed with many types of alternative treatments. Ask your health care provider to refer you to one or more specialists who can help you manage pain through:  Physical or occupational therapy.  Counseling (cognitive-behavioral therapy).  Good nutrition.  Biofeedback.  Massage.  Meditation.  Non-opioid medicine.  Following a gentle exercise program. Where can I get support? If you have been taking opioids for a long time, you may benefit from receiving support for quitting from a local support group or counselor. Ask your health care provider for a referral to these resources in your area. When should I seek medical care? Seek medical care right away if you are taking opioids and you experience any of the following:  Difficulty breathing.  Breathing that is more shallow or slower than normal.  A very slow heartbeat (pulse).  Severe confusion.  Unconsciousness.  Sleepiness.  Difficulty waking from sleep.  Slurred speech.  Nausea and vomiting.  Cold, clammy skin.  Blue lips or fingernails.  Limpness.  Abnormally small pupils. If you think that you or someone else may have taken too much of an opioid medicine, get medical help right away. Do not wait to see if the symptoms go away on their own. Call your local emergency services (911 in the [place] or call the hotline of the [facility] (678) 048-2556 in the [place]  Where can I get more information? To learn more about opioid medicines, visit the Centers  for Disease Control and Prevention web site Opioid Basics at [employer].pt. Summary  Opioid medicines can help you manage moderate-to-severe pain for a short period of time.  Taking opioid pain medicine for a long period of time puts you at risk for unintentional accidents, injury, and even death.  If you think that you or someone else may have taken too much of an opioid, get medical help right away. This information is not intended to replace advice given to you by your health care provider. Make sure you discuss any questions you have with your health care provider. Document Released: 10/16/2015 Document Revised: 05/13/2016 Document Reviewed: 05/01/2015 Elsevier Interactive Patient Education  2017 [employer].

## 2019-08-26 NOTE — Progress Notes (Signed)
Progress Note   Patient ID: Ariel [name], female   DOB: 1946-01-02, 73 y.o.   MRN: [id number]  Body mass index is 30.73 kg/m.  Chief Complaint  Patient presents with  . Shoulder Pain    left/ wants injection, wants surgery after holidays     Encounter Diagnosis  Name Primary?  . Chronic left shoulder pain Yes    73 year old female with history of chronic shoulder pain had an MRI back in 2010 I believe x-rays in 2020 showed glenohumeral arthritis rotator cuff disease without frank tearing presents with left shoulder pain requested injection and possible surgery after the holidays she also request pain medication for her shoulder  Review of systems    Review of Systems  Musculoskeletal:       Status post fracture right fifth finger with some discomfort      BP 125/77   Pulse 91   Ht 5\' 4"  (1.626 m)   Wt 179 lb (81.2 kg)   BMI 30.73 kg/m   Physical Exam Vitals signs and nursing note reviewed.  Constitutional:      Appearance: Normal appearance.  Musculoskeletal:        General: Tenderness present. No swelling or deformity.     Comments: No significant weakness in the rotator cuff  Skin:    General: Skin is warm.  Neurological:     Mental Status: She is alert and oriented to person, place, and time.  Psychiatric:        Mood and Affect: Mood normal.      Medical decisions:  (Established problem worse, x-ray ,physical therapy, over-the-counter medicines, read outside film or summarize x-ray)  Data  Imaging:   The x-ray from January 2020 shows glenohumeral arthritis inferior osteophyte with a curved acromion  MRI 2016 in December IMPRESSION: 1. Significant rotator cuff tendinopathy/tendinosis with interstitial tears and probable shallow articular surface tears. There is also calcific tendinitis noted at the infraspinatus supraspinatus junction area. 2. AC joint degenerative changes and inferior spurring from the distal clavicle and acromion may  contribute to bony impingement. 3. Significant tendinopathy involving the long head biceps tendon with suspected short segment longitudinal split type tear. 4. Advanced glenohumeral joint degenerative changes.   Electronically Signed   By: January M.D.   On: 10/02/2015 13:05  Encounter Diagnosis  Name Primary?  . Chronic left shoulder pain Yes    PLAN:   MRI shoulder   Procedure note the subacromial injection shoulder left   Verbal consent was obtained to inject the  Left   Shoulder  Timeout was completed to confirm the injection site is a subacromial space of the  left  shoulder  Medication used Depo-Medrol 40 mg and lidocaine 1% 3 cc  Anesthesia was provided by ethyl chloride  The injection was performed in the left  posterior subacromial space. After pinning the skin with alcohol and anesthetized the skin with ethyl chloride the subacromial space was injected using a 20-gauge needle. There were no complications  Sterile dressing was applied.   Meds ordered this encounter  Medications  . HYDROcodone-acetaminophen (NORCO/VICODIN) 5-325 MG tablet    Sig: Take 1 tablet by mouth every 6 (six) hours as needed for moderate pain.    Dispense:  30 tablet    Refill:  0    I am ordering a new MRI of the shoulder.  The cuff needs to be documented intact to determine if she can have a total shoulder cuff repair or reverse shoulder  Arther Abbott, MD 08/26/2019 8:55 AM

## 2019-09-12 ENCOUNTER — Ambulatory Visit (HOSPITAL_COMMUNITY)
Admission: RE | Admit: 2019-09-12 | Discharge: 2019-09-12 | Disposition: A | Discharge status: Home / Self Care | Payer: Medicare HMO | Source: Ambulatory Visit | Attending: Orthopedic Surgery | Admitting: Orthopedic Surgery

## 2019-09-12 ENCOUNTER — Other Ambulatory Visit: Payer: Self-pay

## 2019-09-12 DIAGNOSIS — M25512 Pain in left shoulder: Secondary | ICD-10-CM

## 2019-09-12 DIAGNOSIS — M19012 Primary osteoarthritis, left shoulder: Secondary | ICD-10-CM | POA: Diagnosis not present

## 2019-09-17 ENCOUNTER — Telehealth: Payer: Self-pay | Admitting: Radiology

## 2019-09-17 ENCOUNTER — Telehealth: Payer: Self-pay | Admitting: Orthopedic Surgery

## 2019-09-17 DIAGNOSIS — S62636D Displaced fracture of distal phalanx of right little finger, subsequent encounter for fracture with routine healing: Secondary | ICD-10-CM | POA: Diagnosis not present

## 2019-09-17 DIAGNOSIS — M25512 Pain in left shoulder: Secondary | ICD-10-CM

## 2019-09-17 DIAGNOSIS — G8929 Other chronic pain: Secondary | ICD-10-CM

## 2019-09-17 DIAGNOSIS — Y93D3 Activity, furniture building and finishing: Secondary | ICD-10-CM | POA: Diagnosis not present

## 2019-09-17 NOTE — Telephone Encounter (Signed)
-----   Message from Carole Civil, MD sent at 09/17/2019  9:48 AM EST ----- Refer to Dr. Marlou Sa for total shoulder but patient does not want to be seen until January

## 2019-09-17 NOTE — Telephone Encounter (Signed)
Results given  Patient was proceed with evaluation for possible surgical replacement left shoulder  Referral to Dr. Marlou Sa for next year    IMPRESSION: 1. Dominant finding is advanced glenohumeral osteoarthritis with associated severe degeneration of the anterior and superior labrum. 2. Severe appearing rotator cuff and long head of biceps tendinopathy without tear. 3. Moderately severe acromioclavicular osteoarthritis. Small subacromial spur also noted. 4. Small volume of subacromial/subdeltoid fluid compatible with bursitis.   Electronically Signed   By: Inge Rise M.D.   On: 09/12/2019 13:33   Past Medical History:  Diagnosis Date  . Arthritis   . Diverticulosis   . Hyperlipidemia   . Hypertension   . Osteopenia

## 2019-09-18 ENCOUNTER — Telehealth: Payer: Self-pay | Admitting: Orthopedic Surgery

## 2019-09-18 NOTE — Telephone Encounter (Signed)
[name] called stating she is a little confused about what is going on.  She said she thought she was to have an appointment in January but didn't know who or when.    Would you call her and go over with her about what is going on with her shoulder and about her appointment in January with Dr. Marlou Sa?  Thanks

## 2019-09-18 NOTE — Telephone Encounter (Signed)
I called her to advise of the appointment in [place], she has voiced understanding.

## 2019-10-01 ENCOUNTER — Ambulatory Visit: Payer: Self-pay | Admitting: Nurse Practitioner

## 2019-10-11 ENCOUNTER — Ambulatory Visit: Payer: Medicare HMO | Admitting: Orthopedic Surgery

## 2019-10-21 ENCOUNTER — Other Ambulatory Visit: Payer: Self-pay

## 2019-10-22 ENCOUNTER — Ambulatory Visit (INDEPENDENT_AMBULATORY_CARE_PROVIDER_SITE_OTHER): Payer: Medicare HMO | Admitting: Nurse Practitioner

## 2019-10-22 ENCOUNTER — Encounter: Payer: Self-pay | Admitting: Nurse Practitioner

## 2019-10-22 ENCOUNTER — Other Ambulatory Visit: Payer: Self-pay | Admitting: Nurse Practitioner

## 2019-10-22 VITALS — BP 107/73 | HR 71 | Temp 97.1°F | Resp 20 | Ht 64.0 in | Wt 186.0 lb

## 2019-10-22 DIAGNOSIS — E6609 Other obesity due to excess calories: Secondary | ICD-10-CM | POA: Diagnosis not present

## 2019-10-22 DIAGNOSIS — I10 Essential (primary) hypertension: Secondary | ICD-10-CM

## 2019-10-22 DIAGNOSIS — E8881 Metabolic syndrome: Secondary | ICD-10-CM | POA: Diagnosis not present

## 2019-10-22 DIAGNOSIS — E7800 Pure hypercholesterolemia, unspecified: Secondary | ICD-10-CM | POA: Diagnosis not present

## 2019-10-22 DIAGNOSIS — N3281 Overactive bladder: Secondary | ICD-10-CM

## 2019-10-22 DIAGNOSIS — R739 Hyperglycemia, unspecified: Secondary | ICD-10-CM | POA: Diagnosis not present

## 2019-10-22 DIAGNOSIS — R6889 Other general symptoms and signs: Secondary | ICD-10-CM | POA: Diagnosis not present

## 2019-10-22 DIAGNOSIS — Z6833 Body mass index (BMI) 33.0-33.9, adult: Secondary | ICD-10-CM | POA: Diagnosis not present

## 2019-10-22 LAB — BAYER DCA HB A1C WAIVED: HB A1C (BAYER DCA - WAIVED): 6.2 % (ref <7.0)

## 2019-10-22 MED ORDER — MIRABEGRON ER 25 MG PO TB24: 25.0000 mg | ORAL_TABLET | Freq: Every day | ORAL | 1 refills | Status: DC

## 2019-10-22 MED ORDER — ROSUVASTATIN CALCIUM 10 MG PO TABS: 10.0000 mg | ORAL_TABLET | Freq: Every day | ORAL | 1 refills | Status: DC

## 2019-10-22 MED ORDER — VALSARTAN-HYDROCHLOROTHIAZIDE 160-25 MG PO TABS: 0.5000 | ORAL_TABLET | Freq: Every day | ORAL | 1 refills | Status: DC

## 2019-10-22 NOTE — Patient Instructions (Signed)
Diabetes Mellitus and Foot Care Foot care is an important part of your health, especially when you have diabetes. Diabetes may cause you to have problems because of poor blood flow (circulation) to your feet and legs, which can cause your skin to:  Become thinner and drier.  Break more easily.  Heal more slowly.  Peel and crack. You may also have nerve damage (neuropathy) in your legs and feet, causing decreased feeling in them. This means that you may not notice minor injuries to your feet that could lead to more serious problems. Noticing and addressing any potential problems early is the best way to prevent future foot problems. How to care for your feet Foot hygiene  Wash your feet daily with warm water and mild soap. Do not use hot water. Then, pat your feet and the areas between your toes until they are completely dry. Do not soak your feet as this can dry your skin.  Trim your toenails straight across. Do not dig under them or around the cuticle. File the edges of your nails with an emery board or nail file.  Apply a moisturizing lotion or petroleum jelly to the skin on your feet and to dry, brittle toenails. Use lotion that does not contain alcohol and is unscented. Do not apply lotion between your toes. Shoes and socks  Wear clean socks or stockings every day. Make sure they are not too tight. Do not wear knee-high stockings since they may decrease blood flow to your legs.  Wear shoes that fit properly and have enough cushioning. Always look in your shoes before you put them on to be sure there are no objects inside.  To break in new shoes, wear them for just a few hours a day. This prevents injuries on your feet. Wounds, scrapes, corns, and calluses  Check your feet daily for blisters, cuts, bruises, sores, and redness. If you cannot see the bottom of your feet, use a mirror or ask someone for help.  Do not cut corns or calluses or try to remove them with medicine.  If you  find a minor scrape, cut, or break in the skin on your feet, keep it and the skin around it clean and dry. You may clean these areas with mild soap and water. Do not clean the area with peroxide, alcohol, or iodine.  If you have a wound, scrape, corn, or callus on your foot, look at it several times a day to make sure it is healing and not infected. Check for: ? Redness, swelling, or pain. ? Fluid or blood. ? Warmth. ? Pus or a bad smell. General instructions  Do not cross your legs. This may decrease blood flow to your feet.  Do not use heating pads or hot water bottles on your feet. They may burn your skin. If you have lost feeling in your feet or legs, you may not know this is happening until it is too late.  Protect your feet from hot and cold by wearing shoes, such as at the beach or on hot pavement.  Schedule a complete foot exam at least once a year (annually) or more often if you have foot problems. If you have foot problems, report any cuts, sores, or bruises to your health care provider immediately. Contact a health care provider if:  You have a medical condition that increases your risk of infection and you have any cuts, sores, or bruises on your feet.  You have an injury that is not   healing.  You have redness on your legs or feet.  You feel burning or tingling in your legs or feet.  You have pain or cramps in your legs and feet.  Your legs or feet are numb.  Your feet always feel cold.  You have pain around a toenail. Get help right away if:  You have a wound, scrape, corn, or callus on your foot and: ? You have pain, swelling, or redness that gets worse. ? You have fluid or blood coming from the wound, scrape, corn, or callus. ? Your wound, scrape, corn, or callus feels warm to the touch. ? You have pus or a bad smell coming from the wound, scrape, corn, or callus. ? You have a fever. ? You have a red line going up your leg. Summary  Check your feet every day  for cuts, sores, red spots, swelling, and blisters.  Moisturize feet and legs daily.  Wear shoes that fit properly and have enough cushioning.  If you have foot problems, report any cuts, sores, or bruises to your health care provider immediately.  Schedule a complete foot exam at least once a year (annually) or more often if you have foot problems. This information is not intended to replace advice given to you by your health care provider. Make sure you discuss any questions you have with your health care provider. Document Revised: 06/12/2019 Document Reviewed: 10/21/2016 Elsevier Patient Education  2020 Elsevier Inc.  

## 2019-10-22 NOTE — Progress Notes (Signed)
Subjective:    Patient ID: Ariel [name], female    DOB: 07-24-1946, 75 y.o.   MRN: [id number]   Chief Complaint: Medical Management of Chronic Issues    HPI:  1. Essential hypertension No c/ochest pain, sob or headache. Does not check blood pressure at home. BP Readings from Last 3 Encounters:  10/22/19 107/73  08/26/19 125/77  06/25/19 119/78     2. Pure hypercholesterolemia Tries to watch diet and does very little exercise. Lab Results  Component Value Date   CHOL 140 07/31/2018   HDL 45 07/31/2018   LDLCALC 67 07/31/2018   TRIG 139 07/31/2018   CHOLHDL 3.1 07/31/2018     3. Metabolic syndrome She does not check blood sugars at home. She does try to watch diet. Lab Results  Component Value Date   HGBA1C 6.2 07/31/2018     4. Overactive bladder She is on myrbetriq and works well. She does still have to go ton the restroom often.  5. Class 1 obesity due to excess calories with serious comorbidity and body mass index (BMI) of 33.0 to 33.9 in adult No recent weight changes Wt Readings from Last 3 Encounters:  10/22/19 186 lb (84.4 kg)  08/26/19 179 lb (81.2 kg)  06/25/19 186 lb (84.4 kg)   BMI Readings from Last 3 Encounters:  10/22/19 31.93 kg/m  08/26/19 30.73 kg/m  06/25/19 31.93 kg/m       Outpatient Encounter Medications as of 10/22/2019  Medication Sig  . Accu-Chek FastClix Lancets MISC Test BS daily Dx [phone]  . Alcohol Swabs (B-D SINGLE USE SWABS REGULAR) PADS Use daily to check BS Dx E88.81  . Blood Glucose Calibration (ACCU-CHEK AVIVA) SOLN Use with glucose machine Dx E88.81  . Blood Glucose Monitoring Suppl (ACCU-CHEK AVIVA PLUS) w/Device KIT Test BS daily Dx E88.81  . glucose blood (ACCU-CHEK AVIVA PLUS) test strip Test BS daily Dx E08.14  . HYDROcodone-acetaminophen (NORCO/VICODIN) 5-325 MG tablet Take 1 tablet by mouth every 6 (six) hours as needed for moderate pain.  . mirabegron ER (MYRBETRIQ) 25 MG TB24 tablet Take 1 tablet (25 mg  total) by mouth daily.  . rosuvastatin (CRESTOR) 10 MG tablet Take 1 tablet (10 mg total) by mouth daily.  . valsartan-hydrochlorothiazide (DIOVAN-HCT) 160-25 MG tablet Take 0.5 tablets by mouth daily.     Past Surgical History:  Procedure Laterality Date  . ABDOMINAL HYSTERECTOMY  1985  . CARPAL TUNNEL RELEASE Right 2007  . CARPAL TUNNEL RELEASE Left 03/17/2016   Procedure: LEFT CARPAL TUNNEL RELEASE;  Surgeon: Carole Civil, MD;  Location: [facility];  Service: Orthopedics;  Laterality: Left;  . CLOSED REDUCTION FINGER WITH PERCUTANEOUS PINNING Right 06/20/2019   Procedure: RIGHT SMALL FINGER CLOSED REDUCTION PIN FIXATION;  Surgeon: Leanora Cover, MD;  Location: [facility];  Service: Orthopedics;  Laterality: Right;    Family History  Problem Relation Age of Onset  . Diabetes Mother   . Cancer Father 53       colon  . Alcohol abuse Brother   . Cancer Brother        stomach  . Lupus Brother     New complaints: None today  Social history: Lives with her fiance  Controlled substance contract: n/a    Review of Systems  Constitutional: Negative for diaphoresis.  Eyes: Negative for pain.  Respiratory: Negative for shortness of breath.   Cardiovascular: Negative for chest pain, palpitations and leg swelling.  Gastrointestinal: Negative for abdominal pain.  Endocrine: Negative  for polydipsia.  Skin: Negative for rash.  Neurological: Negative for dizziness, weakness and headaches.  Hematological: Does not bruise/bleed easily.  All other systems reviewed and are negative.      Objective:   Physical Exam Vitals and nursing note reviewed.  Constitutional:      General: She is not in acute distress.    Appearance: Normal appearance. She is well-developed.  HENT:     Head: Normocephalic.     Nose: Nose normal.  Eyes:     Pupils: Pupils are equal, round, and reactive to light.  Neck:     Vascular: No carotid bruit or JVD.  Cardiovascular:     Rate and  Rhythm: Normal rate and regular rhythm.     Heart sounds: Normal heart sounds.  Pulmonary:     Effort: Pulmonary effort is normal. No respiratory distress.     Breath sounds: Normal breath sounds. No wheezing or rales.  Chest:     Chest wall: No tenderness.  Abdominal:     General: Bowel sounds are normal. There is no distension or abdominal bruit.     Palpations: Abdomen is soft. There is no hepatomegaly, splenomegaly, mass or pulsatile mass.     Tenderness: There is no abdominal tenderness.  Musculoskeletal:        General: Normal range of motion.     Cervical back: Normal range of motion and neck supple.  Lymphadenopathy:     Cervical: No cervical adenopathy.  Skin:    General: Skin is warm and dry.  Neurological:     Mental Status: She is alert and oriented to person, place, and time.     Deep Tendon Reflexes: Reflexes are normal and symmetric.  Psychiatric:        Behavior: Behavior normal.        Thought Content: Thought content normal.        Judgment: Judgment normal.     BP 107/73   Pulse 71   Temp (!) 97.1 F (36.2 C) (Temporal)   Resp 20   Ht 5' 4"  (1.626 m)   Wt 186 lb (84.4 kg)   SpO2 98%   BMI 31.93 kg/m   hgab1 6.2%     Assessment & Plan:  Ariel [name] comes in today with chief complaint of Medical Management of Chronic Issues   Diagnosis and orders addressed:  1. Essential hypertension Low sodium diet - CMP14+EGFR - valsartan-hydrochlorothiazide (DIOVAN-HCT) 160-25 MG tablet; Take 0.5 tablets by mouth daily.  Dispense: 90 tablet; Refill: 1  2. Pure hypercholesterolemia Low fat diet - Lipid panel - rosuvastatin (CRESTOR) 10 MG tablet; Take 1 tablet (10 mg total) by mouth daily.  Dispense: 90 tablet; Refill: 1  3. Metabolic syndrome Need to watch carbs in diet - Bayer DCA Hb A1c Waived  4. Overactive bladder - mirabegron ER (MYRBETRIQ) 25 MG TB24 tablet; Take 1 tablet (25 mg total) by mouth daily.  Dispense: 90 tablet; Refill: 1  5.  Class 1 obesity due to excess calories with serious comorbidity and body mass index (BMI) of 33.0 to 33.9 in adult Discussed diet and exercise for person with BMI >25 Will recheck weight in 3-6 months   Labs pending Health Maintenance reviewed Diet and exercise encouraged  Follow up plan: 3 months   Mary-Margaret Hassell Done, FNP

## 2019-10-23 ENCOUNTER — Other Ambulatory Visit: Payer: Self-pay | Admitting: Nurse Practitioner

## 2019-10-23 LAB — LIPID PANEL
Chol/HDL Ratio: 3 ratio (ref 0.0–4.4)
Cholesterol, Total: 156 mg/dL (ref 100–199)
HDL: 52 mg/dL (ref >39)
LDL Chol Calc (NIH): 87 mg/dL (ref 0–99)
Triglycerides: 88 mg/dL (ref 0–149)
VLDL Cholesterol Cal: 17 mg/dL (ref 5–40)

## 2019-10-23 LAB — CMP14+EGFR
ALT: 15 IU/L (ref 0–32)
AST: 15 IU/L (ref 0–40)
Albumin/Globulin Ratio: 1.7 (ref 1.2–2.2)
Albumin: 4.3 g/dL (ref 3.7–4.7)
Alkaline Phosphatase: 78 IU/L (ref 39–117)
BUN/Creatinine Ratio: 23 (ref 12–28)
BUN: 16 mg/dL (ref 8–27)
Bilirubin Total: <0.2 mg/dL (ref 0.0–1.2)
CO2: 23 mmol/L (ref 20–29)
Calcium: 10.4 mg/dL — ABNORMAL HIGH (ref 8.7–10.3)
Chloride: 107 mmol/L — ABNORMAL HIGH (ref 96–106)
Creatinine, Ser: 0.7 mg/dL (ref 0.57–1.00)
GFR calc Af Amer: 99 mL/min/{1.73_m2} (ref >59)
GFR calc non Af Amer: 86 mL/min/{1.73_m2} (ref >59)
Globulin, Total: 2.6 g/dL (ref 1.5–4.5)
Glucose: 99 mg/dL (ref 65–99)
Potassium: 4.2 mmol/L (ref 3.5–5.2)
Sodium: 143 mmol/L (ref 134–144)
Total Protein: 6.9 g/dL (ref 6.0–8.5)

## 2019-10-23 NOTE — Telephone Encounter (Signed)
Pt advised her refills were sent in on 10/22/19 and pt voiced understanding.

## 2019-10-24 ENCOUNTER — Telehealth: Payer: Self-pay | Admitting: Nurse Practitioner

## 2019-10-24 DIAGNOSIS — H9193 Unspecified hearing loss, bilateral: Secondary | ICD-10-CM

## 2019-10-24 NOTE — Telephone Encounter (Signed)
REFERRAL REQUEST Telephone Note  What type of referral do you need? Hearing Aid Doctor  Have you been seen at our office for this problem? No (Advise that they will likely need an appointment with their PCP before a referral can be done)  Is there a particular doctor or location that you prefer? [place]  Patient notified that referrals can take up to a week or longer to process. If they haven't heard anything within a week they should call back and speak with the referral department.   MMM's pt.  Please call her.

## 2019-10-30 ENCOUNTER — Telehealth: Payer: Self-pay | Admitting: Nurse Practitioner

## 2019-10-30 DIAGNOSIS — M20011 Mallet finger of right finger(s): Secondary | ICD-10-CM | POA: Diagnosis not present

## 2019-10-30 NOTE — Telephone Encounter (Signed)
Patient aware of last A1c.  Also aware per chart yesterday her referral was faxed to [facility].

## 2019-11-07 ENCOUNTER — Other Ambulatory Visit: Payer: Self-pay

## 2019-11-07 ENCOUNTER — Ambulatory Visit (INDEPENDENT_AMBULATORY_CARE_PROVIDER_SITE_OTHER): Payer: Medicare HMO | Admitting: Orthopedic Surgery

## 2019-11-07 ENCOUNTER — Encounter: Payer: Self-pay | Admitting: Orthopedic Surgery

## 2019-11-07 DIAGNOSIS — M19012 Primary osteoarthritis, left shoulder: Secondary | ICD-10-CM | POA: Diagnosis not present

## 2019-11-07 DIAGNOSIS — R6889 Other general symptoms and signs: Secondary | ICD-10-CM | POA: Diagnosis not present

## 2019-11-08 ENCOUNTER — Telehealth: Payer: Self-pay | Admitting: Orthopedic Surgery

## 2019-11-08 ENCOUNTER — Encounter: Payer: Self-pay | Admitting: Orthopedic Surgery

## 2019-11-08 MED ORDER — CELECOXIB 100 MG PO CAPS: 100.0000 mg | ORAL_CAPSULE | Freq: Two times a day (BID) | ORAL | 0 refills | Status: DC

## 2019-11-08 NOTE — Progress Notes (Signed)
Office Visit Note   Patient: Ariel [name]           Date of Birth: 09/05/1946           MRN: [id number] Visit Date: 11/07/2019 Requested by: Carole Civil, [place] [place],  [place] [address] PCP: Chevis Pretty, FNP  Subjective: Chief Complaint  Patient presents with  . Left Shoulder - Pain    HPI: Ariel [name] is a 74 y.o. female who presents to the office complaining of left shoulder pain.  Patient notes increasing pain in the left shoulder over the past year.  She localizes the majority of the pain to the anterior aspect of the left shoulder with radiation to the armpit.  She denies any neck pain, further radiation or radicular symptoms.  Denies numbness/tingling.  She cannot lay on her left side at night and this pain does wake her up on occasion.  She has no history of left shoulder surgery.  He does note some stiffness but denies any weakness of the left shoulder.  She has had prior cortisone injections into the left shoulder that have provided 1 to 2 months of relief.  These injections are "wearing off".  She is been taking hydrocodone as well as Motrin for her pain.  She has a history of a shoulder dislocation but denies any surgery at that time.  She has had an MRI of the left shoulder in December 2020 that revealed advanced glenohumeral osteoarthritis, severe AC joint arthritis, rotator cuff tendinosis without rotator cuff tear..                ROS:  All systems reviewed are negative as they relate to the chief complaint within the history of present illness.  Patient denies fevers or chills.  Assessment & Plan: Visit Diagnoses:  1. Primary osteoarthritis, left shoulder     Plan: Patient is a 74 year old female who presents complaining of left shoulder pain over the last year.  She has had an MRI that is demonstrated severe left glenohumeral and AC joint arthritis as well as an intact rotator cuff with tendinosis throughout.  She does have preserved  range of motion of the left shoulder but significant pain with range of motion.  No weakness on exam which is a good sign.  Discussed options available to patient regarding her shoulder pain.  After lengthy discussion, patient does not want to proceed with any sort of shoulder replacement at this time.  If patient decides to proceed with shoulder placement, consideration would be total shoulder arthroplasty with convertible baseplate versus reverse shoulder arthroplasty due to the degeneration of her intact rotator cuff.  Will prescribe Celebrex for patient's pain.  Also will provide sling but recommended that patient do not spend a significant portion every day in the sling as this will likely lead to increased shoulder stiffness.  Patient understands.  Patient also wishes to proceed with left shoulder injection and tolerated the procedure well.  She will follow-up with the office as needed.  Follow-Up Instructions: No follow-ups on file.   Orders:  No orders of the defined types were placed in this encounter.  Meds ordered this encounter  Medications  . celecoxib (CELEBREX) 100 MG capsule    Sig: Take 1 capsule (100 mg total) by mouth 2 (two) times daily.    Dispense:  60 capsule    Refill:  0      Procedures: No procedures performed   Clinical Data: No additional findings.  Objective: Vital Signs: There were no vitals taken for this visit.  Physical Exam:  Constitutional: Patient appears well-developed HEENT:  Head: Normocephalic Eyes:EOM are normal Neck: Normal range of motion Cardiovascular: Normal rate Pulmonary/chest: Effort normal Neurologic: Patient is alert Skin: Skin is warm Psychiatric: Patient has normal mood and affect  Ortho Exam:  Left shoulder Exam Greater than 90 degrees of forward flexion, 85 degrees of abduction, 45 degrees of external rotation Moderate tenderness to palpation over the [facility] joint 5/5 motor strength of the subscapularis, supraspinatus,  infraspinatus 5/5 grip strength, forearm pronation/supination, and bicep strength  Specialty Comments:  No specialty comments available.  Imaging: No results found.   PMFS History: Patient Active Problem List   Diagnosis Date Noted  . Nocturia 07/03/2018  . Overactive bladder 07/03/2018  . Carpal tunnel syndrome, left   . HTN (hypertension) 03/20/2013  . HLD (hyperlipidemia) 03/20/2013  . Metabolic syndrome 03/20/2013  . Obesity 03/20/2013   Past Medical History:  Diagnosis Date  . Arthritis   . Diverticulosis   . Hyperlipidemia   . Hypertension   . Osteopenia     Family History  Problem Relation Age of Onset  . Diabetes Mother   . Cancer Father 69       colon  . Alcohol abuse Brother   . Cancer Brother        stomach  . Lupus Brother     Past Surgical History:  Procedure Laterality Date  . ABDOMINAL HYSTERECTOMY  1985  . CARPAL TUNNEL RELEASE Right 2007  . CARPAL TUNNEL RELEASE Left 03/17/2016   Procedure: LEFT CARPAL TUNNEL RELEASE;  Surgeon: Vickki Hearing, MD;  Location: [facility];  Service: Orthopedics;  Laterality: Left;  . CLOSED REDUCTION FINGER WITH PERCUTANEOUS PINNING Right 06/20/2019   Procedure: RIGHT SMALL FINGER CLOSED REDUCTION PIN FIXATION;  Surgeon: Betha Loa, MD;  Location: [facility];  Service: Orthopedics;  Laterality: Right;   Social History   Occupational History  . Occupation: Retired    Comment: Retired from the [employer]  . Smoking status: Never Smoker  . Smokeless tobacco: Never Used  Substance and Sexual Activity  . Alcohol use: Yes    Comment: social  . Drug use: No  . Sexual activity: Yes    Birth control/protection: Surgical

## 2019-11-08 NOTE — Telephone Encounter (Signed)
Patient called.   The patient was told a prescription for pain medicine would be sent in for her after her appointment. It's been two days and she hasn't received it.   Call back number: [phone]

## 2019-11-08 NOTE — Telephone Encounter (Signed)
Tried calling patient to advise, no answer. No VM to LM

## 2019-11-08 NOTE — Telephone Encounter (Signed)
See below please advise.  Thanks  

## 2019-11-11 DIAGNOSIS — R6889 Other general symptoms and signs: Secondary | ICD-10-CM | POA: Diagnosis not present

## 2019-11-12 ENCOUNTER — Encounter: Payer: Self-pay | Admitting: Nurse Practitioner

## 2019-11-12 ENCOUNTER — Other Ambulatory Visit: Payer: Self-pay

## 2019-11-12 ENCOUNTER — Ambulatory Visit (INDEPENDENT_AMBULATORY_CARE_PROVIDER_SITE_OTHER): Payer: Medicare HMO | Admitting: Nurse Practitioner

## 2019-11-12 VITALS — BP 108/67 | HR 75 | Temp 98.4°F | Resp 20 | Ht 64.0 in | Wt 190.0 lb

## 2019-11-12 DIAGNOSIS — H6123 Impacted cerumen, bilateral: Secondary | ICD-10-CM | POA: Diagnosis not present

## 2019-11-12 NOTE — Progress Notes (Signed)
   Subjective:    Patient ID: Ariel [name], female    DOB: 1945-11-19, 74 y.o.   MRN: [id number]   Chief Complaint: Wants ears checked   HPI Patient come sin today c/o wax build up in both ears. Sometimes it affects her hearing. She has no other ocmplaints today.   Review of Systems  Constitutional: Negative for diaphoresis.  HENT: Negative for ear discharge and ear pain.   Eyes: Negative for pain.  Respiratory: Negative for shortness of breath.   Cardiovascular: Negative for chest pain, palpitations and leg swelling.  Gastrointestinal: Negative for abdominal pain.  Endocrine: Negative for polydipsia.  Skin: Negative for rash.  Neurological: Negative for dizziness, weakness and headaches.  Hematological: Does not bruise/bleed easily.  All other systems reviewed and are negative.       Objective:   Physical Exam Vitals and nursing note reviewed.  Constitutional:      Appearance: Normal appearance.  HENT:     Right Ear: There is impacted cerumen.     Left Ear: There is impacted cerumen.  Cardiovascular:     Rate and Rhythm: Normal rate and regular rhythm.     Heart sounds: Normal heart sounds.  Pulmonary:     Effort: Pulmonary effort is normal.     Breath sounds: Normal breath sounds.  Skin:    General: Skin is warm.  Neurological:     General: No focal deficit present.     Mental Status: She is alert.    BP 108/67   Pulse 75   Temp 98.4 F (36.9 C) (Temporal)   Resp 20   Ht 5\' 4"  (1.626 m)   Wt 190 lb (86.2 kg)   SpO2 97%   BMI 32.61 kg/m   Ear Cerumen Removal  Date/Time: 11/12/2019 11:42 AM Performed by: 01/10/2020, FNP Authorized by: Bennie Pierini, FNP   Anesthesia: Local Anesthetic: none Location details: right ear Patient tolerance: patient tolerated the procedure well with no immediate complications Procedure type: irrigation  Sedation: Patient sedated: no   Ear Cerumen Removal  Date/Time: 11/12/2019 11:42 AM Performed  by: 01/10/2020, FNP Authorized by: Bennie Pierini, FNP   Anesthesia: Local Anesthetic: none Location details: left ear Patient tolerance: patient tolerated the procedure well with no immediate complications Procedure type: irrigation  Sedation: Patient sedated: no           Assessment & Plan:  Bennie Pierini in today with chief complaint of Wants ears checked   1. Bilateral impacted cerumen Debrox 2-3x a week RTO prn    The above assessment and management plan was discussed with the patient. The patient verbalized understanding of and has agreed to the management plan. Patient is aware to call the clinic if symptoms persist or worsen. Patient is aware when to return to the clinic for a follow-up visit. Patient educated on when it is appropriate to go to the emergency department.   Ariel Ariel [place], FNP

## 2019-11-12 NOTE — Patient Instructions (Signed)
Earwax Buildup, Adult The ears produce a substance called earwax that helps keep bacteria out of the ear and protects the skin in the ear canal. Occasionally, earwax can build up in the ear and cause discomfort or hearing loss. What increases the risk? This condition is more likely to develop in people who:  Are female.  Are elderly.  Naturally produce more earwax.  Clean their ears often with cotton swabs.  Use earplugs often.  Use in-ear headphones often.  Wear hearing aids.  Have narrow ear canals.  Have earwax that is overly thick or sticky.  Have eczema.  Are dehydrated.  Have excess hair in the ear canal. What are the signs or symptoms? Symptoms of this condition include:  Reduced or muffled hearing.  A feeling of fullness in the ear or feeling that the ear is plugged.  Fluid coming from the ear.  Ear pain.  Ear itch.  Ringing in the ear.  Coughing.  An obvious piece of earwax that can be seen inside the ear canal. How is this diagnosed? This condition may be diagnosed based on:  Your symptoms.  Your medical history.  An ear exam. During the exam, your health care provider will look into your ear with an instrument called an otoscope. You may have tests, including a hearing test. How is this treated? This condition may be treated by:  Using ear drops to soften the earwax.  Having the earwax removed by a health care provider. The health care provider may: ? Flush the ear with water. ? Use an instrument that has a loop on the end (curette). ? Use a suction device.  Surgery to remove the wax buildup. This may be done in severe cases. Follow these instructions at home:   Take over-the-counter and prescription medicines only as told by your health care provider.  Do not put any objects, including cotton swabs, into your ear. You can clean the opening of your ear canal with a washcloth or facial tissue.  Follow instructions from your health care  provider about cleaning your ears. Do not over-clean your ears.  Drink enough fluid to keep your urine clear or pale yellow. This will help to thin the earwax.  Keep all follow-up visits as told by your health care provider. If earwax builds up in your ears often or if you use hearing aids, consider seeing your health care provider for routine, preventive ear cleanings. Ask your health care provider how often you should schedule your cleanings.  If you have hearing aids, clean them according to instructions from the manufacturer and your health care provider. Contact a health care provider if:  You have ear pain.  You develop a fever.  You have blood, pus, or other fluid coming from your ear.  You have hearing loss.  You have ringing in your ears that does not go away.  Your symptoms do not improve with treatment.  You feel like the room is spinning (vertigo). Summary  Earwax can build up in the ear and cause discomfort or hearing loss.  The most common symptoms of this condition include reduced or muffled hearing and a feeling of fullness in the ear or feeling that the ear is plugged.  This condition may be diagnosed based on your symptoms, your medical history, and an ear exam.  This condition may be treated by using ear drops to soften the earwax or by having the earwax removed by a health care provider.  Do not put any   objects, including cotton swabs, into your ear. You can clean the opening of your ear canal with a washcloth or facial tissue. This information is not intended to replace advice given to you by your health care provider. Make sure you discuss any questions you have with your health care provider. Document Revised: 09/01/2017 Document Reviewed: 11/30/2016 Elsevier Patient Education  2020 Elsevier Inc.  

## 2019-12-03 ENCOUNTER — Ambulatory Visit: Payer: Medicare HMO

## 2019-12-04 ENCOUNTER — Other Ambulatory Visit: Payer: Self-pay | Admitting: Surgical

## 2019-12-04 NOTE — Telephone Encounter (Signed)
Ok to rf? 

## 2019-12-09 DIAGNOSIS — R6889 Other general symptoms and signs: Secondary | ICD-10-CM | POA: Diagnosis not present

## 2020-01-01 ENCOUNTER — Other Ambulatory Visit: Payer: Self-pay | Admitting: Surgical

## 2020-01-01 NOTE — Telephone Encounter (Signed)
Pls advise. Thanks.  

## 2020-01-14 ENCOUNTER — Other Ambulatory Visit: Payer: Self-pay | Admitting: Nurse Practitioner

## 2020-01-15 DIAGNOSIS — R6889 Other general symptoms and signs: Secondary | ICD-10-CM | POA: Diagnosis not present

## 2020-01-21 ENCOUNTER — Ambulatory Visit (INDEPENDENT_AMBULATORY_CARE_PROVIDER_SITE_OTHER): Payer: Medicare HMO | Admitting: Nurse Practitioner

## 2020-01-21 ENCOUNTER — Other Ambulatory Visit: Payer: Self-pay

## 2020-01-21 ENCOUNTER — Encounter: Payer: Self-pay | Admitting: Nurse Practitioner

## 2020-01-21 VITALS — BP 116/77 | HR 72 | Temp 97.3°F | Ht 64.0 in | Wt 189.0 lb

## 2020-01-21 DIAGNOSIS — E6609 Other obesity due to excess calories: Secondary | ICD-10-CM | POA: Diagnosis not present

## 2020-01-21 DIAGNOSIS — E7800 Pure hypercholesterolemia, unspecified: Secondary | ICD-10-CM

## 2020-01-21 DIAGNOSIS — I10 Essential (primary) hypertension: Secondary | ICD-10-CM

## 2020-01-21 DIAGNOSIS — R739 Hyperglycemia, unspecified: Secondary | ICD-10-CM | POA: Diagnosis not present

## 2020-01-21 DIAGNOSIS — E8881 Metabolic syndrome: Secondary | ICD-10-CM | POA: Diagnosis not present

## 2020-01-21 DIAGNOSIS — Z6833 Body mass index (BMI) 33.0-33.9, adult: Secondary | ICD-10-CM

## 2020-01-21 DIAGNOSIS — N3281 Overactive bladder: Secondary | ICD-10-CM

## 2020-01-21 LAB — BAYER DCA HB A1C WAIVED: HB A1C (BAYER DCA - WAIVED): 6 % (ref <7.0)

## 2020-01-21 MED ORDER — ROSUVASTATIN CALCIUM 10 MG PO TABS: 10.0000 mg | ORAL_TABLET | Freq: Every day | ORAL | 1 refills | Status: DC

## 2020-01-21 MED ORDER — VALSARTAN-HYDROCHLOROTHIAZIDE 160-25 MG PO TABS: 0.5000 | ORAL_TABLET | Freq: Every day | ORAL | 1 refills | Status: DC

## 2020-01-21 MED ORDER — MIRABEGRON ER 25 MG PO TB24: 25.0000 mg | ORAL_TABLET | Freq: Every day | ORAL | 1 refills | Status: DC

## 2020-01-21 NOTE — Patient Instructions (Signed)
Carbohydrate Counting for Diabetes Mellitus, Adult  Carbohydrate counting is a method of keeping track of how many carbohydrates you eat. Eating carbohydrates naturally increases the amount of sugar (glucose) in the blood. Counting how many carbohydrates you eat helps keep your blood glucose within normal limits, which helps you manage your diabetes (diabetes mellitus). It is important to know how many carbohydrates you can safely have in each meal. This is different for every person. A diet and nutrition specialist (registered dietitian) can help you make a meal plan and calculate how many carbohydrates you should have at each meal and snack. Carbohydrates are found in the following foods:  Grains, such as breads and cereals.  Dried beans and soy products.  Starchy vegetables, such as potatoes, peas, and corn.  Fruit and fruit juices.  Milk and yogurt.  Sweets and snack foods, such as cake, cookies, candy, chips, and soft drinks. How do I count carbohydrates? There are two ways to count carbohydrates in food. You can use either of the methods or a combination of both. Reading "Nutrition Facts" on packaged food The "Nutrition Facts" list is included on the labels of almost all packaged foods and beverages in the [place] It includes:  The serving size.  Information about nutrients in each serving, including the grams (g) of carbohydrate per serving. To use the "Nutrition Facts":  Decide how many servings you will have.  Multiply the number of servings by the number of carbohydrates per serving.  The resulting number is the total amount of carbohydrates that you will be having. Learning standard serving sizes of other foods When you eat carbohydrate foods that are not packaged or do not include "Nutrition Facts" on the label, you need to measure the servings in order to count the amount of carbohydrates:  Measure the foods that you will eat with a food scale or measuring cup, if  needed.  Decide how many standard-size servings you will eat.  Multiply the number of servings by 15. Most carbohydrate-rich foods have about 15 g of carbohydrates per serving. ? For example, if you eat 8 oz (170 g) of strawberries, you will have eaten 2 servings and 30 g of carbohydrates (2 servings x 15 g = 30 g).  For foods that have more than one food mixed, such as soups and casseroles, you must count the carbohydrates in each food that is included. The following list contains standard serving sizes of common carbohydrate-rich foods. Each of these servings has about 15 g of carbohydrates:   hamburger bun or  English muffin.   oz (15 mL) syrup.   oz (14 g) jelly.  1 slice of bread.  1 six-inch tortilla.  3 oz (85 g) cooked rice or pasta.  4 oz (113 g) cooked dried beans.  4 oz (113 g) starchy vegetable, such as peas, corn, or potatoes.  4 oz (113 g) hot cereal.  4 oz (113 g) mashed potatoes or  of a large baked potato.  4 oz (113 g) canned or frozen fruit.  4 oz (120 mL) fruit juice.  4-6 crackers.  6 chicken nuggets.  6 oz (170 g) unsweetened dry cereal.  6 oz (170 g) plain fat-free yogurt or yogurt sweetened with artificial sweeteners.  8 oz (240 mL) milk.  8 oz (170 g) fresh fruit or one small piece of fruit.  24 oz (680 g) popped popcorn. Example of carbohydrate counting Sample meal  3 oz (85 g) chicken breast.  6 oz (170 g)   brown rice.  4 oz (113 g) corn.  8 oz (240 mL) milk.  8 oz (170 g) strawberries with sugar-free whipped topping. Carbohydrate calculation 1. Identify the foods that contain carbohydrates: ? Rice. ? Corn. ? Milk. ? Strawberries. 2. Calculate how many servings you have of each food: ? 2 servings rice. ? 1 serving corn. ? 1 serving milk. ? 1 serving strawberries. 3. Multiply each number of servings by 15 g: ? 2 servings rice x 15 g = 30 g. ? 1 serving corn x 15 g = 15 g. ? 1 serving milk x 15 g = 15 g. ? 1  serving strawberries x 15 g = 15 g. 4. Add together all of the amounts to find the total grams of carbohydrates eaten: ? 30 g + 15 g + 15 g + 15 g = 75 g of carbohydrates total. Summary  Carbohydrate counting is a method of keeping track of how many carbohydrates you eat.  Eating carbohydrates naturally increases the amount of sugar (glucose) in the blood.  Counting how many carbohydrates you eat helps keep your blood glucose within normal limits, which helps you manage your diabetes.  A diet and nutrition specialist (registered dietitian) can help you make a meal plan and calculate how many carbohydrates you should have at each meal and snack. This information is not intended to replace advice given to you by your health care provider. Make sure you discuss any questions you have with your health care provider. Document Revised: 04/13/2017 Document Reviewed: 03/02/2016 Elsevier Patient Education  2020 Elsevier Inc.  

## 2020-01-21 NOTE — Progress Notes (Signed)
Subjective:    Patient ID: Ariel [name], female    DOB: [birth year], 74 y.o.   MRN: [id number]   Chief Complaint: Medical Management of Chronic Issues    HPI:  1. Essential hypertension No c/o chest pain, sob or headaches. Does  check blood pressure at home and is always below 903 systolic. BP Readings from Last 3 Encounters:  01/21/20 116/77  11/12/19 108/67  10/22/19 107/73     2. Pure hypercholesterolemia Tries to watch diet and does walk 3-4 x a week.  Lab Results  Component Value Date   CHOL 156 10/22/2019   HDL 52 10/22/2019   LDLCALC 87 10/22/2019   TRIG 88 10/22/2019   CHOLHDL 3.0 10/22/2019     3. Metabolic syndrome She does ot check blood sugars at home. Lab Results  Component Value Date   HGBA1C 6.2 10/22/2019    4. Overactive bladder myrbetriq helps keep her from having to go to the restroom so much.  5. Class 1 obesity due to excess calories with serious comorbidity and body mass index (BMI) of 33.0 to 33.9 in adult No recent weight changes Wt Readings from Last 3 Encounters:  01/21/20 189 lb (85.7 kg)  11/12/19 190 lb (86.2 kg)  10/22/19 186 lb (84.4 kg)   BMI Readings from Last 3 Encounters:  01/21/20 32.44 kg/m  11/12/19 32.61 kg/m  10/22/19 31.93 kg/m       Outpatient Encounter Medications as of 01/21/2020  Medication Sig  . Accu-Chek FastClix Lancets MISC Test BS daily Dx [phone]  . Alcohol Swabs (B-D SINGLE USE SWABS REGULAR) PADS Use daily to check BS Dx E88.81  . Blood Glucose Calibration (ACCU-CHEK AVIVA) SOLN Use with glucose machine Dx E88.81  . Blood Glucose Monitoring Suppl (ACCU-CHEK AVIVA PLUS) w/Device KIT Test BS daily Dx E88.81  . glucose blood (ACCU-CHEK AVIVA PLUS) test strip Test BS daily Dx E88.81  . ibuprofen (ADVIL) 800 MG tablet TAKE 1 TABLET BY MOUTH EVERY 8 HOURS AS NEEDED  . mirabegron ER (MYRBETRIQ) 25 MG TB24 tablet Take 1 tablet (25 mg total) by mouth daily.  . rosuvastatin (CRESTOR) 10 MG tablet Take 1  tablet (10 mg total) by mouth daily.  . valsartan-hydrochlorothiazide (DIOVAN-HCT) 160-25 MG tablet Take 0.5 tablets by mouth daily.     Past Surgical History:  Procedure Laterality Date  . ABDOMINAL HYSTERECTOMY  1985  . CARPAL TUNNEL RELEASE Right 2007  . CARPAL TUNNEL RELEASE Left 03/17/2016   Procedure: LEFT CARPAL TUNNEL RELEASE;  Surgeon: Carole Civil, MD;  Location: [facility];  Service: Orthopedics;  Laterality: Left;  . CLOSED REDUCTION FINGER WITH PERCUTANEOUS PINNING Right 06/20/2019   Procedure: RIGHT SMALL FINGER CLOSED REDUCTION PIN FIXATION;  Surgeon: Leanora Cover, MD;  Location: [facility];  Service: Orthopedics;  Laterality: Right;    Family History  Problem Relation Age of Onset  . Diabetes Mother   . Cancer Father 91       colon  . Alcohol abuse Brother   . Cancer Brother        stomach  . Lupus Brother     New complaints: None today  Social history: Getting married in July  Controlled substance contract: n/a    Review of Systems  Constitutional: Negative for diaphoresis.  Eyes: Negative for pain.  Respiratory: Negative for shortness of breath.   Cardiovascular: Negative for chest pain, palpitations and leg swelling.  Gastrointestinal: Negative for abdominal pain.  Endocrine: Negative for polydipsia.  Skin: Negative  for rash.  Neurological: Negative for dizziness, weakness and headaches.  Hematological: Does not bruise/bleed easily.  All other systems reviewed and are negative.      Objective:   Physical Exam Vitals and nursing note reviewed.  Constitutional:      General: She is not in acute distress.    Appearance: Normal appearance. She is well-developed.  HENT:     Head: Normocephalic.     Nose: Nose normal.  Eyes:     Pupils: Pupils are equal, round, and reactive to light.  Neck:     Vascular: No carotid bruit or JVD.  Cardiovascular:     Rate and Rhythm: Normal rate and regular rhythm.     Heart sounds: Normal  heart sounds.  Pulmonary:     Effort: Pulmonary effort is normal. No respiratory distress.     Breath sounds: Normal breath sounds. No wheezing or rales.  Chest:     Chest wall: No tenderness.  Abdominal:     General: Bowel sounds are normal. There is no distension or abdominal bruit.     Palpations: Abdomen is soft. There is no hepatomegaly, splenomegaly, mass or pulsatile mass.     Tenderness: There is no abdominal tenderness.  Musculoskeletal:        General: Normal range of motion.     Cervical back: Normal range of motion and neck supple.  Lymphadenopathy:     Cervical: No cervical adenopathy.  Skin:    General: Skin is warm and dry.  Neurological:     Mental Status: She is alert and oriented to person, place, and time.     Deep Tendon Reflexes: Reflexes are normal and symmetric.  Psychiatric:        Behavior: Behavior normal.        Thought Content: Thought content normal.        Judgment: Judgment normal.    BP 116/77   Pulse 72   Temp (!) 97.3 F (36.3 C) (Temporal)   Ht 5' 4"  (1.626 m)   Wt 189 lb (85.7 kg)   BMI 32.44 kg/m   HGBA1c 6.0     Assessment & Plan:  Tashica Provencio comes in today with chief complaint of Medical Management of Chronic Issues   Diagnosis and orders addressed:  1. Essential hypertension Low sodium diet - CBC with Differential/Platelet - CMP14+EGFR - valsartan-hydrochlorothiazide (DIOVAN-HCT) 160-25 MG tablet; Take 0.5 tablets by mouth daily.  Dispense: 90 tablet; Refill: 1  2. Pure hypercholesterolemia Low fat diet - Lipid panel - rosuvastatin (CRESTOR) 10 MG tablet; Take 1 tablet (10 mg total) by mouth daily.  Dispense: 90 tablet; Refill: 1  3. Metabolic syndrome Watch carbsin diet - Bayer DCA Hb A1c Waived - Microalbumin / creatinine urine ratio  4. Overactive bladder - mirabegron ER (MYRBETRIQ) 25 MG TB24 tablet; Take 1 tablet (25 mg total) by mouth daily.  Dispense: 90 tablet; Refill: 1  5. Class 1 obesity due to excess  calories with serious comorbidity and body mass index (BMI) of 33.0 to 33.9 in adult Discussed diet and exercise for person with BMI >25 Will recheck weight in 3-6 months   Labs pending Health Maintenance reviewed Diet and exercise encouraged  Follow up plan: 3 months   Mary-Margaret Hassell Done, FNP

## 2020-01-22 LAB — LIPID PANEL
Chol/HDL Ratio: 2.7 ratio (ref 0.0–4.4)
Cholesterol, Total: 152 mg/dL (ref 100–199)
HDL: 57 mg/dL (ref >39)
LDL Chol Calc (NIH): 81 mg/dL (ref 0–99)
Triglycerides: 74 mg/dL (ref 0–149)
VLDL Cholesterol Cal: 14 mg/dL (ref 5–40)

## 2020-01-22 LAB — CMP14+EGFR
ALT: 14 IU/L (ref 0–32)
AST: 19 IU/L (ref 0–40)
Albumin/Globulin Ratio: 1.7 (ref 1.2–2.2)
Albumin: 4.3 g/dL (ref 3.7–4.7)
Alkaline Phosphatase: 78 IU/L (ref 39–117)
BUN/Creatinine Ratio: 19 (ref 12–28)
BUN: 13 mg/dL (ref 8–27)
Bilirubin Total: <0.2 mg/dL (ref 0.0–1.2)
CO2: 23 mmol/L (ref 20–29)
Calcium: 10.3 mg/dL (ref 8.7–10.3)
Chloride: 101 mmol/L (ref 96–106)
Creatinine, Ser: 0.69 mg/dL (ref 0.57–1.00)
GFR calc Af Amer: 100 mL/min/{1.73_m2} (ref >59)
GFR calc non Af Amer: 87 mL/min/{1.73_m2} (ref >59)
Globulin, Total: 2.5 g/dL (ref 1.5–4.5)
Glucose: 105 mg/dL — ABNORMAL HIGH (ref 65–99)
Potassium: 4.3 mmol/L (ref 3.5–5.2)
Sodium: 140 mmol/L (ref 134–144)
Total Protein: 6.8 g/dL (ref 6.0–8.5)

## 2020-01-22 LAB — CBC WITH DIFFERENTIAL/PLATELET
Basophils Absolute: 0.1 10*3/uL (ref 0.0–0.2)
Basos: 1 %
EOS (ABSOLUTE): 0.3 10*3/uL (ref 0.0–0.4)
Eos: 5 %
Hematocrit: 36.4 % (ref 34.0–46.6)
Hemoglobin: 12.2 g/dL (ref 11.1–15.9)
Immature Grans (Abs): 0 10*3/uL (ref 0.0–0.1)
Immature Granulocytes: 0 %
Lymphocytes Absolute: 2.2 10*3/uL (ref 0.7–3.1)
Lymphs: 36 %
MCH: 28.5 pg (ref 26.6–33.0)
MCHC: 33.5 g/dL (ref 31.5–35.7)
MCV: 85 fL (ref 79–97)
Monocytes Absolute: 0.7 10*3/uL (ref 0.1–0.9)
Monocytes: 12 %
Neutrophils Absolute: 2.8 10*3/uL (ref 1.4–7.0)
Neutrophils: 46 %
Platelets: 296 10*3/uL (ref 150–450)
RBC: 4.28 x10E6/uL (ref 3.77–5.28)
RDW: 12.8 % (ref 11.7–15.4)
WBC: 6.1 10*3/uL (ref 3.4–10.8)

## 2020-01-27 DIAGNOSIS — H2513 Age-related nuclear cataract, bilateral: Secondary | ICD-10-CM | POA: Diagnosis not present

## 2020-01-27 DIAGNOSIS — H43813 Vitreous degeneration, bilateral: Secondary | ICD-10-CM | POA: Diagnosis not present

## 2020-01-27 DIAGNOSIS — E7800 Pure hypercholesterolemia, unspecified: Secondary | ICD-10-CM | POA: Diagnosis not present

## 2020-01-27 DIAGNOSIS — H5200 Hypermetropia, unspecified eye: Secondary | ICD-10-CM | POA: Diagnosis not present

## 2020-04-27 ENCOUNTER — Ambulatory Visit: Payer: Self-pay | Admitting: Nurse Practitioner

## 2020-04-28 ENCOUNTER — Encounter: Payer: Self-pay | Admitting: Nurse Practitioner

## 2020-04-28 ENCOUNTER — Other Ambulatory Visit: Payer: Self-pay | Admitting: Nurse Practitioner

## 2020-04-28 DIAGNOSIS — Z1231 Encounter for screening mammogram for malignant neoplasm of breast: Secondary | ICD-10-CM

## 2020-05-08 ENCOUNTER — Encounter: Payer: Self-pay | Admitting: Nurse Practitioner

## 2020-05-08 ENCOUNTER — Ambulatory Visit (INDEPENDENT_AMBULATORY_CARE_PROVIDER_SITE_OTHER): Payer: Medicare HMO | Admitting: Nurse Practitioner

## 2020-05-08 DIAGNOSIS — G5602 Carpal tunnel syndrome, left upper limb: Secondary | ICD-10-CM | POA: Diagnosis not present

## 2020-05-08 DIAGNOSIS — E7800 Pure hypercholesterolemia, unspecified: Secondary | ICD-10-CM

## 2020-05-08 DIAGNOSIS — I10 Essential (primary) hypertension: Secondary | ICD-10-CM | POA: Diagnosis not present

## 2020-05-08 DIAGNOSIS — E6609 Other obesity due to excess calories: Secondary | ICD-10-CM

## 2020-05-08 DIAGNOSIS — Z6833 Body mass index (BMI) 33.0-33.9, adult: Secondary | ICD-10-CM

## 2020-05-08 DIAGNOSIS — N3281 Overactive bladder: Secondary | ICD-10-CM | POA: Diagnosis not present

## 2020-05-08 MED ORDER — VALSARTAN-HYDROCHLOROTHIAZIDE 160-25 MG PO TABS: 0.5000 | ORAL_TABLET | Freq: Every day | ORAL | 1 refills | Status: DC

## 2020-05-08 MED ORDER — ROSUVASTATIN CALCIUM 10 MG PO TABS: 10.0000 mg | ORAL_TABLET | Freq: Every day | ORAL | 1 refills | Status: DC

## 2020-05-08 MED ORDER — MIRABEGRON ER 25 MG PO TB24: 25.0000 mg | ORAL_TABLET | Freq: Every day | ORAL | 1 refills | Status: DC

## 2020-05-08 NOTE — Progress Notes (Signed)
Virtual Visit via telephone Note Due to COVID-19 pandemic this visit was conducted virtually. This visit type was conducted due to national recommendations for restrictions regarding the COVID-19 Pandemic (e.g. social distancing, sheltering in place) in an effort to limit this patient's exposure and mitigate transmission in our community. All issues noted in this document were discussed and addressed.  A physical exam was not performed with this format.  I connected with Ariel [name] on 05/08/20 at 10:00 by telephone and verified that I am speaking with the correct person using two identifiers. Ariel [name] is currently located at home and no one is currently with her during visit. The provider, Tresa Garter, RN is located in their office at time of visit.  I discussed the limitations, risks, security and privacy concerns of performing an evaluation and management service by telephone and the availability of in person appointments. I also discussed with the patient that there may be a patient responsible charge related to this service. The patient expressed understanding and agreed to proceed.   History and Present Illness:   Chief Complaint: Medical Management of Chronic Issues    HPI:  1. Essential hypertension  Does check BP at home, reports it being close to 117/77. Does try to restrict sodium in her diet.  BP Readings from Last 3 Encounters:  01/21/20 116/77  11/12/19 108/67  10/22/19 107/73     2. Carpal tunnel syndrome, left Reports having some numbness in her hand, complains of no weakness or pain.   3. Overactive bladder Reports no incontinence and decreased nocturia.   4. Pure hypercholesterolemia Does try to restrict the fat in her diet. Reports that she does exercise some days during the week with a lot of walking.  Lab Results  Component Value Date   CHOL 152 01/21/2020   HDL 57 01/21/2020   LDLCALC 81 01/21/2020   TRIG 74 01/21/2020   CHOLHDL 2.7  01/21/2020   The 10-year ASCVD risk score Mikey Bussing DC Jr., et al., 2013) is: 9.4%   Values used to calculate the score:     Age: 74 years     Sex: Female     Is Non-Hispanic African American: Yes     Diabetic: No     Tobacco smoker: No     Systolic Blood Pressure: 696 mmHg     Is BP treated: Yes     HDL Cholesterol: 57 mg/dL     Total Cholesterol: 152 mg/dL   5. Class 1 obesity due to excess calories with serious comorbidity and body mass index (BMI) of 33.0 to 33.9 in adult Patient reports not weighing herself at home but does exercise an adequate amount and restricts her dietary fat intake.  BMI Readings from Last 3 Encounters:  01/21/20 32.44 kg/m  11/12/19 32.61 kg/m  10/22/19 31.93 kg/m   Wt Readings from Last 3 Encounters:  01/21/20 189 lb (85.7 kg)  11/12/19 190 lb (86.2 kg)  10/22/19 186 lb (84.4 kg)       Outpatient Encounter Medications as of 05/08/2020  Medication Sig  . Accu-Chek FastClix Lancets MISC Test BS daily Dx [phone]  . Alcohol Swabs (B-D SINGLE USE SWABS REGULAR) PADS Use daily to check BS Dx E88.81  . Blood Glucose Calibration (ACCU-CHEK AVIVA) SOLN Use with glucose machine Dx E88.81  . Blood Glucose Monitoring Suppl (ACCU-CHEK AVIVA PLUS) w/Device KIT Test BS daily Dx E88.81  . glucose blood (ACCU-CHEK AVIVA PLUS) test strip Test BS daily Dx B01.75  .  ibuprofen (ADVIL) 800 MG tablet TAKE 1 TABLET BY MOUTH EVERY 8 HOURS AS NEEDED  . mirabegron ER (MYRBETRIQ) 25 MG TB24 tablet Take 1 tablet (25 mg total) by mouth daily.  . rosuvastatin (CRESTOR) 10 MG tablet Take 1 tablet (10 mg total) by mouth daily.  . valsartan-hydrochlorothiazide (DIOVAN-HCT) 160-25 MG tablet Take 0.5 tablets by mouth daily.   No facility-administered encounter medications on file as of 05/08/2020.    Past Surgical History:  Procedure Laterality Date  . ABDOMINAL HYSTERECTOMY  1985  . CARPAL TUNNEL RELEASE Right 2007  . CARPAL TUNNEL RELEASE Left 03/17/2016   Procedure: LEFT  CARPAL TUNNEL RELEASE;  Surgeon: Carole Civil, MD;  Location: [facility];  Service: Orthopedics;  Laterality: Left;  . CLOSED REDUCTION FINGER WITH PERCUTANEOUS PINNING Right 06/20/2019   Procedure: RIGHT SMALL FINGER CLOSED REDUCTION PIN FIXATION;  Surgeon: Leanora Cover, MD;  Location: [facility];  Service: Orthopedics;  Laterality: Right;    Family History  Problem Relation Age of Onset  . Diabetes Mother   . Cancer Father 2       colon  . Alcohol abuse Brother   . Cancer Brother        stomach  . Lupus Brother     New complaints: -pain in left shoulder- has seen ortho in [place]. May need surgery in furture.she says she is going to call and make appointment  Social history: Lives alone, does work several days out of the week.   Controlled substance contract: n/a    Review of Systems  Constitutional: Negative.   HENT: Negative.   Eyes: Negative.   Respiratory: Negative.   Cardiovascular: Negative.   Gastrointestinal: Negative.   Genitourinary: Negative.   Musculoskeletal:       Pain in her left shoulder is new and numbness from carpel tunnel syndrome in her left hand is a worsening problem.   Skin: Negative.   Neurological: Negative.   Endo/Heme/Allergies: Negative.   Psychiatric/Behavioral: Negative.      Observations/Objective: Alert and oriented- answers all questions appropriately No distress Pain in left shoulder with pain on abduction, flexion and extension. No weakness noted in left hand.   Assessment and Plan: Ariel [name] comes in today with chief complaint of Medical Management of Chronic Issues   Diagnosis and orders addressed:  1. Essential hypertension Low sodium diet - valsartan-hydrochlorothiazide (DIOVAN-HCT) 160-25 MG tablet; Take 0.5 tablets by mouth daily.  Dispense: 90 tablet; Refill: 1  2. Carpal tunnel syndrome, left No change  3. Overactive bladder - mirabegron ER (MYRBETRIQ) 25 MG TB24 tablet; Take 1 tablet  (25 mg total) by mouth daily.  Dispense: 90 tablet; Refill: 1  4. Pure hypercholesterolemia Low fat diet - rosuvastatin (CRESTOR) 10 MG tablet; Take 1 tablet (10 mg total) by mouth daily.  Dispense: 90 tablet; Refill: 1  5. Class 1 obesity due to excess calories with serious comorbidity and body mass index (BMI) of 33.0 to 33.9 in adult Discussed diet and exercise for person with BMI >25 Will recheck weight in 3-6 months  6. Left shoulder pain Patient to call and make appointment with ortho herself  Previous labs reviewed Health Maintenance reviewed Diet and exercise encouraged  Follow Up Instructions: 3 months    I discussed the assessment and treatment plan with the patient. The patient was provided an opportunity to ask questions and all were answered. The patient agreed with the plan and demonstrated an understanding of the instructions.   The patient was  advised to call back or seek an in-person evaluation if the symptoms worsen or if the condition fails to improve as anticipated.  The above assessment and management plan was discussed with the patient. The patient verbalized understanding of and has agreed to the management plan. Patient is aware to call the clinic if symptoms persist or worsen. Patient is aware when to return to the clinic for a follow-up visit. Patient educated on when it is appropriate to go to the emergency department.   Time call ended:  10:20  I provided 20 minutes of non-face-to-face time during this encounter.    Tresa Garter, RN  [place], FNP

## 2020-05-13 ENCOUNTER — Ambulatory Visit (INDEPENDENT_AMBULATORY_CARE_PROVIDER_SITE_OTHER): Payer: Medicare HMO | Admitting: Family Medicine

## 2020-05-13 ENCOUNTER — Encounter: Payer: Self-pay | Admitting: Family Medicine

## 2020-05-13 DIAGNOSIS — R202 Paresthesia of skin: Secondary | ICD-10-CM

## 2020-05-13 DIAGNOSIS — R200 Anesthesia of skin: Secondary | ICD-10-CM

## 2020-05-13 MED ORDER — PREDNISONE 10 MG (21) PO TBPK: ORAL_TABLET | ORAL | 0 refills | Status: DC

## 2020-05-13 NOTE — Progress Notes (Signed)
Virtual Visit via Telephone Note  I connected with Ariel [name] on 05/13/20 at 1:17 PM by telephone and verified that I am speaking with the correct person using two identifiers. Ariel [name] is currently located at home and nobody is currently with her during this visit. The provider, Loman Brooklyn, FNP is located in their office at time of visit.  I discussed the limitations, risks, security and privacy concerns of performing an evaluation and management service by telephone and the availability of in person appointments. I also discussed with the patient that there may be a patient responsible charge related to this service. The patient expressed understanding and agreed to proceed.  Subjective: PCP: Chevis Pretty, FNP  Chief Complaint  Patient presents with  . Numbness   Patient reports constant numbness in her right leg from her waist to her knee that has been present for a couple of days now.  She does also experience a tingling pain that comes and goes.  Denies any unusual activity prior to the onset of symptoms.  She does do a lot of walking, but states that is nothing new for her.  She denies any back pain, difficulty walking, numbness of any other parts of her body, swelling, erythema, change in speech, and drooping of the face.   ROS: Per HPI  Current Outpatient Medications:  .  Accu-Chek FastClix Lancets MISC, Test BS daily Dx E88.81, Disp: 100 each, Rfl: 3 .  Alcohol Swabs (B-D SINGLE USE SWABS REGULAR) PADS, Use daily to check BS Dx E88.81, Disp: 100 each, Rfl: 3 .  Blood Glucose Calibration (ACCU-CHEK AVIVA) SOLN, Use with glucose machine Dx E88.81, Disp: 1 each, Rfl: 0 .  Blood Glucose Monitoring Suppl (ACCU-CHEK AVIVA PLUS) w/Device KIT, Test BS daily Dx E88.81, Disp: 1 kit, Rfl: 0 .  glucose blood (ACCU-CHEK AVIVA PLUS) test strip, Test BS daily Dx E88.81, Disp: 100 each, Rfl: 3 .  ibuprofen (ADVIL) 800 MG tablet, TAKE 1 TABLET BY MOUTH EVERY 8 HOURS AS  NEEDED, Disp: 270 tablet, Rfl: 0 .  mirabegron ER (MYRBETRIQ) 25 MG TB24 tablet, Take 1 tablet (25 mg total) by mouth daily., Disp: 90 tablet, Rfl: 1 .  rosuvastatin (CRESTOR) 10 MG tablet, Take 1 tablet (10 mg total) by mouth daily., Disp: 90 tablet, Rfl: 1 .  valsartan-hydrochlorothiazide (DIOVAN-HCT) 160-25 MG tablet, Take 0.5 tablets by mouth daily., Disp: 90 tablet, Rfl: 1  No Known Allergies Past Medical History:  Diagnosis Date  . Arthritis   . Diverticulosis   . Hyperlipidemia   . Hypertension   . Osteopenia     Observations/Objective: A&O  No respiratory distress or wheezing audible over the phone Mood, judgement, and thought processes all WNL   Assessment and Plan: 1. Numbness and tingling of right leg - predniSONE (STERAPRED UNI-PAK 21 TAB) 10 MG (21) TBPK tablet; As directed x 6 days  Dispense: 21 tablet; Refill: 0   Follow Up Instructions:  I discussed the assessment and treatment plan with the patient. The patient was provided an opportunity to ask questions and all were answered. The patient agreed with the plan and demonstrated an understanding of the instructions.   The patient was advised to call back or seek an in-person evaluation if the symptoms worsen or if the condition fails to improve as anticipated.  The above assessment and management plan was discussed with the patient. The patient verbalized understanding of and has agreed to the management plan. Patient is aware to call the clinic  if symptoms persist or worsen. Patient is aware when to return to the clinic for a follow-up visit. Patient educated on when it is appropriate to go to the emergency department.   Time call ended: 1:24 PM  I provided 9 minutes of non-face-to-face time during this encounter.  Hendricks Limes, MSN, APRN, FNP-C [facility] 05/13/20

## 2020-05-20 ENCOUNTER — Telehealth: Payer: Self-pay | Admitting: Nurse Practitioner

## 2020-05-20 NOTE — Telephone Encounter (Signed)
Pt finished taking predniSONE (STERAPRED UNI-PAK 21 TAB) 10 MG (21) TBPK tablet but still having soreness on RT side. Is this a side effect from the medication. Please call back

## 2020-05-20 NOTE — Telephone Encounter (Signed)
Pt states she finished the prednisone and it helped her leg so she doesn't need to make a follow up appt.

## 2020-06-02 ENCOUNTER — Other Ambulatory Visit: Payer: Self-pay

## 2020-06-02 ENCOUNTER — Encounter: Payer: Self-pay | Admitting: Nurse Practitioner

## 2020-06-02 ENCOUNTER — Ambulatory Visit (INDEPENDENT_AMBULATORY_CARE_PROVIDER_SITE_OTHER): Payer: Medicare HMO | Admitting: Nurse Practitioner

## 2020-06-02 VITALS — BP 118/74 | HR 78 | Temp 97.9°F | Resp 20 | Ht 64.0 in | Wt 185.0 lb

## 2020-06-02 DIAGNOSIS — M79651 Pain in right thigh: Secondary | ICD-10-CM

## 2020-06-02 MED ORDER — METHOCARBAMOL 500 MG PO TABS: 500.0000 mg | ORAL_TABLET | Freq: Two times a day (BID) | ORAL | 0 refills | Status: DC

## 2020-06-02 MED ORDER — NAPROXEN 500 MG PO TABS
500.0000 mg | ORAL_TABLET | Freq: Two times a day (BID) | ORAL | 1 refills | Status: DC
Start: 2020-06-02 — End: 2020-08-11

## 2020-06-02 NOTE — Progress Notes (Signed)
Subjective:    Patient ID: Ariel [name], female    DOB: Feb 21, 1946, 74 y.o.   MRN: [id number]   Chief Complaint: Right leg pain   HPI patient comes in  today c/o of right leg pain.describes it as a soreness in right upper anterior thigh. Hurts to lay on at night. Started a couple of weeks ago. It was worse, but she was rx a steroid pack which has helped. Motirn helps. She denies injury. Rates pain today 6/10. Resting helps ease it some. Walking increases pain.   * she says before  taking the prednisone she had numbness in her leg.   Review of Systems  Constitutional: Negative for diaphoresis.  Eyes: Negative for pain.  Respiratory: Negative for shortness of breath.   Cardiovascular: Negative for chest pain, palpitations and leg swelling.  Gastrointestinal: Negative for abdominal pain.  Endocrine: Negative for polydipsia.  Skin: Negative for rash.  Neurological: Negative for dizziness, weakness and headaches.  Hematological: Does not bruise/bleed easily.  All other systems reviewed and are negative.      Objective:   Physical Exam Vitals and nursing note reviewed.  Constitutional:      Appearance: Normal appearance.  Cardiovascular:     Rate and Rhythm: Normal rate and regular rhythm.     Heart sounds: Normal heart sounds.  Musculoskeletal:     Comments: Slight pain deep palpation of right upper anterior thigh- no nodules, no heat . Motor strength and sensation intact 2+ peal pulse palpable. Brisk cap refill Foot warm to touch  Skin:    General: Skin is warm and dry.  Neurological:     General: No focal deficit present.     Mental Status: She is alert and oriented to person, place, and time.  Psychiatric:        Mood and Affect: Mood normal.        Behavior: Behavior normal.    BP 118/74   Pulse 78   Temp 97.9 F (36.6 C) (Temporal)   Resp 20   Ht 5\' 4"  (1.626 m)   Wt 185 lb (83.9 kg)   SpO2 97%   BMI 31.76 kg/m   . Lab Results  Component Value Date    CREATININE 0.69 01/21/2020          Assessment & Plan:  Ariel [name] in today with chief complaint of Right leg pain   1. Right thigh pain Moist heat Rest Meds ordered this encounter  Medications  . methocarbamol (ROBAXIN) 500 MG tablet    Sig: Take 1 tablet (500 mg total) by mouth in the morning and at bedtime.    Dispense:  40 tablet    Refill:  0    Order Specific Question:   Supervising Provider    Answer:   Ariel [name] A [facility]  . naproxen (NAPROSYN) 500 MG tablet    Sig: Take 1 tablet (500 mg total) by mouth 2 (two) times daily with a meal.    Dispense:  40 tablet    Refill:  1    Order Specific Question:   Supervising Provider    Answer:   [id number] [[id number]]   Sedation precaution with robaxin RTO prn    The above assessment and management plan was discussed with the patient. The patient verbalized understanding of and has agreed to the management plan. Patient is aware to call the clinic if symptoms persist or worsen. Patient is aware when to return to the clinic for a follow-up visit.  Patient educated on when it is appropriate to go to the emergency department.   Mary-Margaret Hassell Done, FNP

## 2020-08-05 ENCOUNTER — Encounter: Payer: Self-pay | Admitting: *Deleted

## 2020-08-11 ENCOUNTER — Ambulatory Visit (INDEPENDENT_AMBULATORY_CARE_PROVIDER_SITE_OTHER): Payer: Medicare HMO | Admitting: Nurse Practitioner

## 2020-08-11 ENCOUNTER — Encounter: Payer: Self-pay | Admitting: Nurse Practitioner

## 2020-08-11 ENCOUNTER — Other Ambulatory Visit: Payer: Self-pay

## 2020-08-11 ENCOUNTER — Ambulatory Visit
Admission: RE | Admit: 2020-08-11 | Discharge: 2020-08-11 | Disposition: A | Discharge status: Home / Self Care | Payer: Medicare HMO | Source: Ambulatory Visit | Attending: Nurse Practitioner | Admitting: Nurse Practitioner

## 2020-08-11 VITALS — BP 113/74 | HR 81 | Temp 97.7°F | Resp 20 | Ht 64.0 in | Wt 181.0 lb

## 2020-08-11 DIAGNOSIS — E6609 Other obesity due to excess calories: Secondary | ICD-10-CM

## 2020-08-11 DIAGNOSIS — N3281 Overactive bladder: Secondary | ICD-10-CM | POA: Diagnosis not present

## 2020-08-11 DIAGNOSIS — E8881 Metabolic syndrome: Secondary | ICD-10-CM | POA: Diagnosis not present

## 2020-08-11 DIAGNOSIS — Z6833 Body mass index (BMI) 33.0-33.9, adult: Secondary | ICD-10-CM

## 2020-08-11 DIAGNOSIS — Z1231 Encounter for screening mammogram for malignant neoplasm of breast: Secondary | ICD-10-CM

## 2020-08-11 DIAGNOSIS — I10 Essential (primary) hypertension: Secondary | ICD-10-CM

## 2020-08-11 DIAGNOSIS — E7800 Pure hypercholesterolemia, unspecified: Secondary | ICD-10-CM

## 2020-08-11 DIAGNOSIS — Z1152 Encounter for screening for COVID-19: Secondary | ICD-10-CM | POA: Diagnosis not present

## 2020-08-11 LAB — LIPID PANEL
Chol/HDL Ratio: 3.3 ratio (ref 0.0–4.4)
Cholesterol, Total: 182 mg/dL (ref 100–199)
HDL: 55 mg/dL (ref >39)
LDL Chol Calc (NIH): 105 mg/dL — ABNORMAL HIGH (ref 0–99)
Triglycerides: 122 mg/dL (ref 0–149)
VLDL Cholesterol Cal: 22 mg/dL (ref 5–40)

## 2020-08-11 LAB — CMP14+EGFR
ALT: 15 IU/L (ref 0–32)
AST: 19 IU/L (ref 0–40)
Albumin/Globulin Ratio: 1.5 (ref 1.2–2.2)
Albumin: 4.6 g/dL (ref 3.7–4.7)
Alkaline Phosphatase: 85 IU/L (ref 44–121)
BUN/Creatinine Ratio: 21 (ref 12–28)
BUN: 14 mg/dL (ref 8–27)
Bilirubin Total: 0.2 mg/dL (ref 0.0–1.2)
CO2: 25 mmol/L (ref 20–29)
Calcium: 10.2 mg/dL (ref 8.7–10.3)
Chloride: 103 mmol/L (ref 96–106)
Creatinine, Ser: 0.66 mg/dL (ref 0.57–1.00)
GFR calc Af Amer: 101 mL/min/{1.73_m2} (ref >59)
GFR calc non Af Amer: 88 mL/min/{1.73_m2} (ref >59)
Globulin, Total: 3.1 g/dL (ref 1.5–4.5)
Glucose: 100 mg/dL — ABNORMAL HIGH (ref 65–99)
Potassium: 4.1 mmol/L (ref 3.5–5.2)
Sodium: 142 mmol/L (ref 134–144)
Total Protein: 7.7 g/dL (ref 6.0–8.5)

## 2020-08-11 LAB — CBC WITH DIFFERENTIAL/PLATELET
Basophils Absolute: 0.1 10*3/uL (ref 0.0–0.2)
Basos: 1 %
EOS (ABSOLUTE): 0.2 10*3/uL (ref 0.0–0.4)
Eos: 3 %
Hematocrit: 39 % (ref 34.0–46.6)
Hemoglobin: 12.7 g/dL (ref 11.1–15.9)
Immature Grans (Abs): 0 10*3/uL (ref 0.0–0.1)
Immature Granulocytes: 0 %
Lymphocytes Absolute: 2.6 10*3/uL (ref 0.7–3.1)
Lymphs: 36 %
MCH: 28.2 pg (ref 26.6–33.0)
MCHC: 32.6 g/dL (ref 31.5–35.7)
MCV: 87 fL (ref 79–97)
Monocytes Absolute: 0.9 10*3/uL (ref 0.1–0.9)
Monocytes: 12 %
Neutrophils Absolute: 3.5 10*3/uL (ref 1.4–7.0)
Neutrophils: 48 %
Platelets: 290 10*3/uL (ref 150–450)
RBC: 4.51 x10E6/uL (ref 3.77–5.28)
RDW: 12.8 % (ref 11.7–15.4)
WBC: 7.3 10*3/uL (ref 3.4–10.8)

## 2020-08-11 LAB — BAYER DCA HB A1C WAIVED: HB A1C (BAYER DCA - WAIVED): 5.9 % (ref <7.0)

## 2020-08-11 MED ORDER — MIRABEGRON ER 25 MG PO TB24: 25.0000 mg | ORAL_TABLET | Freq: Every day | ORAL | 1 refills | Status: DC

## 2020-08-11 MED ORDER — ROSUVASTATIN CALCIUM 10 MG PO TABS: 10.0000 mg | ORAL_TABLET | Freq: Every day | ORAL | 1 refills | Status: DC

## 2020-08-11 MED ORDER — VALSARTAN-HYDROCHLOROTHIAZIDE 160-25 MG PO TABS: 0.5000 | ORAL_TABLET | Freq: Every day | ORAL | 1 refills | Status: DC

## 2020-08-11 NOTE — Patient Instructions (Signed)
Carbohydrate Counting for Diabetes Mellitus, Adult  Carbohydrate counting is a method of keeping track of how many carbohydrates you eat. Eating carbohydrates naturally increases the amount of sugar (glucose) in the blood. Counting how many carbohydrates you eat helps keep your blood glucose within normal limits, which helps you manage your diabetes (diabetes mellitus). It is important to know how many carbohydrates you can safely have in each meal. This is different for every person. A diet and nutrition specialist (registered dietitian) can help you make a meal plan and calculate how many carbohydrates you should have at each meal and snack. Carbohydrates are found in the following foods:  Grains, such as breads and cereals.  Dried beans and soy products.  Starchy vegetables, such as potatoes, peas, and corn.  Fruit and fruit juices.  Milk and yogurt.  Sweets and snack foods, such as cake, cookies, candy, chips, and soft drinks. How do I count carbohydrates? There are two ways to count carbohydrates in food. You can use either of the methods or a combination of both. Reading "Nutrition Facts" on packaged food The "Nutrition Facts" list is included on the labels of almost all packaged foods and beverages in the [place] It includes:  The serving size.  Information about nutrients in each serving, including the grams (g) of carbohydrate per serving. To use the "Nutrition Facts":  Decide how many servings you will have.  Multiply the number of servings by the number of carbohydrates per serving.  The resulting number is the total amount of carbohydrates that you will be having. Learning standard serving sizes of other foods When you eat carbohydrate foods that are not packaged or do not include "Nutrition Facts" on the label, you need to measure the servings in order to count the amount of carbohydrates:  Measure the foods that you will eat with a food scale or measuring cup, if  needed.  Decide how many standard-size servings you will eat.  Multiply the number of servings by 15. Most carbohydrate-rich foods have about 15 g of carbohydrates per serving. ? For example, if you eat 8 oz (170 g) of strawberries, you will have eaten 2 servings and 30 g of carbohydrates (2 servings x 15 g = 30 g).  For foods that have more than one food mixed, such as soups and casseroles, you must count the carbohydrates in each food that is included. The following list contains standard serving sizes of common carbohydrate-rich foods. Each of these servings has about 15 g of carbohydrates:   hamburger bun or  English muffin.   oz (15 mL) syrup.   oz (14 g) jelly.  1 slice of bread.  1 six-inch tortilla.  3 oz (85 g) cooked rice or pasta.  4 oz (113 g) cooked dried beans.  4 oz (113 g) starchy vegetable, such as peas, corn, or potatoes.  4 oz (113 g) hot cereal.  4 oz (113 g) mashed potatoes or  of a large baked potato.  4 oz (113 g) canned or frozen fruit.  4 oz (120 mL) fruit juice.  4-6 crackers.  6 chicken nuggets.  6 oz (170 g) unsweetened dry cereal.  6 oz (170 g) plain fat-free yogurt or yogurt sweetened with artificial sweeteners.  8 oz (240 mL) milk.  8 oz (170 g) fresh fruit or one small piece of fruit.  24 oz (680 g) popped popcorn. Example of carbohydrate counting Sample meal  3 oz (85 g) chicken breast.  6 oz (170 g)   brown rice.  4 oz (113 g) corn.  8 oz (240 mL) milk.  8 oz (170 g) strawberries with sugar-free whipped topping. Carbohydrate calculation 1. Identify the foods that contain carbohydrates: ? Rice. ? Corn. ? Milk. ? Strawberries. 2. Calculate how many servings you have of each food: ? 2 servings rice. ? 1 serving corn. ? 1 serving milk. ? 1 serving strawberries. 3. Multiply each number of servings by 15 g: ? 2 servings rice x 15 g = 30 g. ? 1 serving corn x 15 g = 15 g. ? 1 serving milk x 15 g = 15 g. ? 1  serving strawberries x 15 g = 15 g. 4. Add together all of the amounts to find the total grams of carbohydrates eaten: ? 30 g + 15 g + 15 g + 15 g = 75 g of carbohydrates total. Summary  Carbohydrate counting is a method of keeping track of how many carbohydrates you eat.  Eating carbohydrates naturally increases the amount of sugar (glucose) in the blood.  Counting how many carbohydrates you eat helps keep your blood glucose within normal limits, which helps you manage your diabetes.  A diet and nutrition specialist (registered dietitian) can help you make a meal plan and calculate how many carbohydrates you should have at each meal and snack. This information is not intended to replace advice given to you by your health care provider. Make sure you discuss any questions you have with your health care provider. Document Revised: 04/13/2017 Document Reviewed: 03/02/2016 Elsevier Patient Education  2020 Elsevier Inc.  

## 2020-08-11 NOTE — Progress Notes (Signed)
Subjective:    Patient ID: Ariel [name], female    DOB: [birth year], 74 y.o.   MRN: [id number]   Chief Complaint: Medical Management of Chronic Issues    HPI:  1. Essential hypertension No c/o chest pain, sob or headache. Does not check blood pressure at home. BP Readings from Last 3 Encounters:  08/11/20 113/74  06/02/20 118/74  01/21/20 116/77     2. Pure hypercholesterolemia Watching diet and stays very active. Lab Results  Component Value Date   CHOL 152 01/21/2020   HDL 57 01/21/2020   LDLCALC 81 01/21/2020   TRIG 74 01/21/2020   CHOLHDL 2.7 01/21/2020     3. Metabolic syndrome Does not check blood sugars at home. She stays very active. Lab Results  Component Value Date   HGBA1C 6.0 01/21/2020     4. Overactive bladder Is on myrbetriq daily and it really helps keep her from having to void all the time.  6. Class 1 obesity due to excess calories with serious comorbidity and body mass index (BMI) of 33.0 to 33.9 in adult Weight is down 4lbs from previous visit Wt Readings from Last 3 Encounters:  08/11/20 181 lb (82.1 kg)  06/02/20 185 lb (83.9 kg)  01/21/20 189 lb (85.7 kg)   BMI Readings from Last 3 Encounters:  08/11/20 31.07 kg/m  06/02/20 31.76 kg/m  01/21/20 32.44 kg/m      Outpatient Encounter Medications as of 08/11/2020  Medication Sig  . Accu-Chek FastClix Lancets MISC Test BS daily Dx [phone]  . Alcohol Swabs (B-D SINGLE USE SWABS REGULAR) PADS Use daily to check BS Dx E88.81  . Blood Glucose Calibration (ACCU-CHEK AVIVA) SOLN Use with glucose machine Dx E88.81  . Blood Glucose Monitoring Suppl (ACCU-CHEK AVIVA PLUS) w/Device KIT Test BS daily Dx E88.81  . glucose blood (ACCU-CHEK AVIVA PLUS) test strip Test BS daily Dx E88.81  . ibuprofen (ADVIL) 800 MG tablet TAKE 1 TABLET BY MOUTH EVERY 8 HOURS AS NEEDED  . mirabegron ER (MYRBETRIQ) 25 MG TB24 tablet Take 1 tablet (25 mg total) by mouth daily.  . rosuvastatin (CRESTOR) 10 MG tablet  Take 1 tablet (10 mg total) by mouth daily.  . valsartan-hydrochlorothiazide (DIOVAN-HCT) 160-25 MG tablet Take 0.5 tablets by mouth daily.    Past Surgical History:  Procedure Laterality Date  . ABDOMINAL HYSTERECTOMY  1985  . CARPAL TUNNEL RELEASE Right 2007  . CARPAL TUNNEL RELEASE Left 03/17/2016   Procedure: LEFT CARPAL TUNNEL RELEASE;  Surgeon: Carole Civil, MD;  Location: [facility];  Service: Orthopedics;  Laterality: Left;  . CLOSED REDUCTION FINGER WITH PERCUTANEOUS PINNING Right 06/20/2019   Procedure: RIGHT SMALL FINGER CLOSED REDUCTION PIN FIXATION;  Surgeon: Leanora Cover, MD;  Location: [place] [facility];  Service: Orthopedics;  Laterality: Right;    Family History  Problem Relation Age of Onset  . Diabetes Mother   . Cancer Father 36       colon  . Alcohol abuse Brother   . Cancer Brother        stomach  . Lupus Brother     New complaints: None today  Social history: Is getting married next year- currently lived with her fiance.  Controlled substance contract: n/a    Review of Systems  Constitutional: Negative for diaphoresis.  Eyes: Negative for pain.  Respiratory: Negative for shortness of breath.   Cardiovascular: Negative for chest pain, palpitations and leg swelling.  Gastrointestinal: Negative for abdominal pain.  Endocrine: Negative for polydipsia.  Skin: Negative for rash.  Neurological: Negative for dizziness, weakness and headaches.  Hematological: Does not bruise/bleed easily.  All other systems reviewed and are negative.      Objective:   Physical Exam Vitals and nursing note reviewed.  Constitutional:      General: She is not in acute distress.    Appearance: Normal appearance. She is well-developed.  HENT:     Head: Normocephalic.     Nose: Nose normal.  Eyes:     Pupils: Pupils are equal, round, and reactive to light.  Neck:     Vascular: No carotid bruit or JVD.  Cardiovascular:     Rate and Rhythm: Normal rate and  regular rhythm.     Heart sounds: Normal heart sounds.  Pulmonary:     Effort: Pulmonary effort is normal. No respiratory distress.     Breath sounds: Normal breath sounds. No wheezing or rales.  Chest:     Chest wall: No tenderness.  Abdominal:     General: Bowel sounds are normal. There is no distension or abdominal bruit.     Palpations: Abdomen is soft. There is no hepatomegaly, splenomegaly, mass or pulsatile mass.     Tenderness: There is no abdominal tenderness.  Musculoskeletal:        General: Normal range of motion.     Cervical back: Normal range of motion and neck supple.  Lymphadenopathy:     Cervical: No cervical adenopathy.  Skin:    General: Skin is warm and dry.  Neurological:     Mental Status: She is alert and oriented to person, place, and time.     Deep Tendon Reflexes: Reflexes are normal and symmetric.  Psychiatric:        Behavior: Behavior normal.        Thought Content: Thought content normal.        Judgment: Judgment normal.     BP 113/74   Pulse 81   Temp 97.7 F (36.5 C) (Temporal)   Resp 20   Ht _0  (1.626 m)   Wt 181 lb (82.1 kg)   SpO2 100%   BMI 31.07 kg/m   hgba1c 5.9%     Assessment & Plan:  Ariel [name] comes in today with chief complaint of Medical Management of Chronic Issues   Diagnosis and orders addressed:  1. Primary hypertension Low sodium diet - CBC with Differential/Platelet - CMP14+EGFR  2. Pure hypercholesterolemia Low fat diet - Lipid panel  3. Metabolic syndrome Continue to watch carbs in diet - Bayer DCA Hb A1c Waived - Microalbumin / creatinine urine ratio  4. Overactive bladder Continue myrbetriq  5. Class 1 obesity due to excess calories with serious comorbidity and body mass index (BMI) of 33.0 to 33.9 in adult Discussed diet and exercise for person with BMI >25 Will recheck weight in 3-6 months    Labs pending Health Maintenance reviewed Diet and exercise encouraged  Follow up  plan: 6 months   Mary-Margaret Hassell Done, FNP

## 2020-08-31 ENCOUNTER — Other Ambulatory Visit: Payer: Self-pay

## 2020-08-31 ENCOUNTER — Ambulatory Visit
Admission: RE | Admit: 2020-08-31 | Discharge: 2020-08-31 | Disposition: A | Discharge status: Home / Self Care | Payer: Medicare HMO | Source: Ambulatory Visit | Attending: Nurse Practitioner | Admitting: Nurse Practitioner

## 2020-08-31 ENCOUNTER — Other Ambulatory Visit: Payer: Self-pay | Admitting: Nurse Practitioner

## 2020-08-31 DIAGNOSIS — Z1231 Encounter for screening mammogram for malignant neoplasm of breast: Secondary | ICD-10-CM | POA: Diagnosis not present

## 2020-09-03 ENCOUNTER — Other Ambulatory Visit: Payer: Self-pay | Admitting: *Deleted

## 2020-09-03 DIAGNOSIS — E7800 Pure hypercholesterolemia, unspecified: Secondary | ICD-10-CM

## 2020-09-03 DIAGNOSIS — I10 Essential (primary) hypertension: Secondary | ICD-10-CM

## 2020-09-03 DIAGNOSIS — N3281 Overactive bladder: Secondary | ICD-10-CM

## 2020-09-03 MED ORDER — TRUE METRIX BLOOD GLUCOSE TEST VI STRP: ORAL_STRIP | 3 refills | Status: DC

## 2020-09-03 MED ORDER — TRUEPLUS LANCETS 30G MISC: 3 refills | Status: DC

## 2020-09-03 MED ORDER — ROSUVASTATIN CALCIUM 10 MG PO TABS: 10.0000 mg | ORAL_TABLET | Freq: Every day | ORAL | 0 refills | Status: DC

## 2020-09-03 MED ORDER — TRUE METRIX AIR GLUCOSE METER W/DEVICE KIT: PACK | 0 refills | Status: DC

## 2020-09-03 MED ORDER — MIRABEGRON ER 25 MG PO TB24: 25.0000 mg | ORAL_TABLET | Freq: Every day | ORAL | 0 refills | Status: DC

## 2020-09-03 MED ORDER — IBUPROFEN 800 MG PO TABS
800.0000 mg | ORAL_TABLET | Freq: Three times a day (TID) | ORAL | 0 refills | Status: DC | PRN
Start: 2020-09-03 — End: 2021-01-19

## 2020-09-03 MED ORDER — VALSARTAN-HYDROCHLOROTHIAZIDE 160-25 MG PO TABS: 0.5000 | ORAL_TABLET | Freq: Every day | ORAL | 0 refills | Status: DC

## 2020-09-04 DIAGNOSIS — Z20822 Contact with and (suspected) exposure to covid-19: Secondary | ICD-10-CM | POA: Diagnosis not present

## 2020-09-04 DIAGNOSIS — Z03818 Encounter for observation for suspected exposure to other biological agents ruled out: Secondary | ICD-10-CM | POA: Diagnosis not present

## 2020-09-07 ENCOUNTER — Other Ambulatory Visit: Payer: Self-pay

## 2020-09-07 MED ORDER — TRUEPLUS LANCETS 30G MISC
3 refills | Status: DC
Start: 2020-09-07 — End: 2022-02-11

## 2020-09-30 ENCOUNTER — Encounter: Payer: Self-pay | Admitting: *Deleted

## 2020-11-12 ENCOUNTER — Other Ambulatory Visit: Payer: Self-pay | Admitting: *Deleted

## 2020-11-12 MED ORDER — TRUE METRIX LEVEL 1 LOW VI SOLN: 0 refills | Status: DC

## 2020-11-12 MED ORDER — BD SWAB SINGLE USE REGULAR PADS
MEDICATED_PAD | 3 refills | Status: DC
Start: 2020-11-12 — End: 2022-02-11

## 2020-12-22 ENCOUNTER — Ambulatory Visit: Payer: Medicare HMO | Admitting: Nurse Practitioner

## 2021-01-19 ENCOUNTER — Other Ambulatory Visit: Payer: Self-pay | Admitting: Nurse Practitioner

## 2021-02-08 ENCOUNTER — Ambulatory Visit: Payer: Self-pay | Admitting: Nurse Practitioner

## 2021-02-09 ENCOUNTER — Ambulatory Visit: Payer: Medicare HMO | Admitting: Nurse Practitioner

## 2021-02-10 ENCOUNTER — Ambulatory Visit (INDEPENDENT_AMBULATORY_CARE_PROVIDER_SITE_OTHER): Payer: Medicare HMO

## 2021-02-10 VITALS — Ht 64.0 in | Wt 180.0 lb

## 2021-02-10 DIAGNOSIS — Z0000 Encounter for general adult medical examination without abnormal findings: Secondary | ICD-10-CM | POA: Diagnosis not present

## 2021-02-10 NOTE — Progress Notes (Addendum)
Subjective:   Shandelle Borrelli is a 75 y.o. female who presents for Medicare Annual (Subsequent) preventive examination.  Virtual Visit via Telephone Note  I connected with  [name] on 02/14/21 at 11:15 AM EDT by telephone and verified that I am speaking with the correct person using two identifiers.  Location: Patient: Home Provider: [facility] Persons participating in the virtual visit: patient/Nurse Health Advisor   I discussed the limitations, risks, security and privacy concerns of performing an evaluation and management service by telephone and the availability of in person appointments. The patient expressed understanding and agreed to proceed.  Interactive audio and video telecommunications were attempted between this nurse and patient, however failed, due to patient having technical difficulties OR patient did not have access to video capability.  We continued and completed visit with audio only.  Some vital signs may be absent or patient reported.   Ivy Lynn, NP   Review of Systems     Cardiac Risk Factors include: advanced age (>67mn, >>14women);obesity (BMI >30kg/m2);sedentary lifestyle;diabetes mellitus;dyslipidemia     Objective:    Today's Vitals   02/10/21 1227  Weight: 180 lb (81.6 kg)  Height: 5' 4"  (1.626 m)   Body mass index is 30.9 kg/m.  Advanced Directives 02/10/2021 06/20/2019 06/14/2019 05/22/2019 05/11/2018 10/13/2016 03/17/2016  Does Patient Have a Medical Advance Directive? No Yes No No No No No  Type of Advance Directive - Healthcare [place] - - - - -  Does patient want to make changes to medical advance directive? - No - Patient declined - - - - -  [place]? - No - copy requested - - - - -  Would patient like information on creating a medical advance directive? No - Patient declined - No - Patient declined Yes (MAU/Ambulatory/Procedural Areas - Information given) No - Patient declined No  - Patient declined -    Current Medications (verified) Outpatient Encounter Medications as of 02/10/2021  Medication Sig  . Alcohol Swabs (B-D SINGLE USE SWABS REGULAR) PADS Use daily to check BS Dx E88.81  . Blood Glucose Calibration (TRUE METRIX LEVEL 1) Low SOLN Use with glucose machine Dx E88.81  . Blood Glucose Monitoring Suppl (TRUE METRIX AIR GLUCOSE METER) w/Device KIT Test BS daily Dx E88.81  . glucose blood (TRUE METRIX BLOOD GLUCOSE TEST) test strip Test BS daily Dx E88.81  . ibuprofen (ADVIL) 800 MG tablet TAKE 1 TABLET BY MOUTH EVERY 8 HOURS AS NEEDED  . mirabegron ER (MYRBETRIQ) 25 MG TB24 tablet Take 1 tablet (25 mg total) by mouth daily.  . rosuvastatin (CRESTOR) 10 MG tablet Take 1 tablet (10 mg total) by mouth daily.  . TRUEplus Lancets 30G MISC Test BS daily Dx E88.81  . valsartan-hydrochlorothiazide (DIOVAN-HCT) 160-25 MG tablet Take 0.5 tablets by mouth daily.   No facility-administered encounter medications on file as of 02/10/2021.    Allergies (verified) Patient has no known allergies.   History: Past Medical History:  Diagnosis Date  . Arthritis   . Diverticulosis   . Hyperlipidemia   . Hypertension   . Osteopenia    Past Surgical History:  Procedure Laterality Date  . ABDOMINAL HYSTERECTOMY  1985  . CARPAL TUNNEL RELEASE Right 2007  . CARPAL TUNNEL RELEASE Left 03/17/2016   Procedure: LEFT CARPAL TUNNEL RELEASE;  Surgeon: SCarole Civil MD;  Location: [facility];  Service: Orthopedics;  Laterality: Left;  . CLOSED REDUCTION FINGER WITH PERCUTANEOUS PINNING Right  06/20/2019   Procedure: RIGHT SMALL FINGER CLOSED REDUCTION PIN FIXATION;  Surgeon: Leanora Cover, MD;  Location: [facility];  Service: Orthopedics;  Laterality: Right;   Family History  Problem Relation Age of Onset  . Diabetes Mother   . Cancer Father 77       colon  . Alcohol abuse Brother   . Cancer Brother        stomach  . Lupus Brother    Social History    Socioeconomic History  . Marital status: Widowed    Spouse name: Not on file  . Number of children: 1  . Years of education: 44  . Highest education level: 12th grade  Occupational History  . Occupation: Retired    Comment: Retired from the [employer]  . Smoking status: Never Smoker  . Smokeless tobacco: Never Used  Vaping Use  . Vaping Use: Never used  Substance and Sexual Activity  . Alcohol use: Yes    Comment: social  . Drug use: No  . Sexual activity: Yes    Birth control/protection: Surgical  Other Topics Concern  . Not on file  Social History Narrative   Lives with new husband  - recently re-married 01/2021. One level living   Social Determinants of Health   Financial Resource Strain: Medium Risk  . Difficulty of Paying Living Expenses: Somewhat hard  Food Insecurity: No Food Insecurity  . Worried About Charity fundraiser in the Last Year: Never true  . Ran Out of Food in the Last Year: Never true  Transportation Needs: No Transportation Needs  . Lack of Transportation (Medical): No  . Lack of Transportation (Non-Medical): No  Physical Activity: Insufficiently Active  . Days of Exercise per Week: 3 days  . Minutes of Exercise per Session: 30 min  Stress: No Stress Concern Present  . Feeling of Stress : Not at all  Social Connections: Moderately Isolated  . Frequency of Communication with Friends and Family: More than three times a week  . Frequency of Social Gatherings with Friends and Family: More than three times a week  . Attends Religious Services: More than 4 times per year  . Active Member of Clubs or Organizations: No  . Attends Archivist Meetings: Never  . Marital Status: Widowed    Tobacco Counseling Counseling given: Not Answered   Clinical Intake:  Pre-visit preparation completed: Yes  Pain : No/denies pain     BMI - recorded: 30.9 Nutritional Status: BMI > 30  Obese Nutritional Risks: None Diabetes: Yes CBG done?:  No Did pt. bring in CBG monitor from home?: No  How often do you need to have someone help you when you read instructions, pamphlets, or other written materials from your doctor or pharmacy?: 1 - Never  Nutrition Risk Assessment:  Has the patient had any N/V/D within the last 2 months?  No  Does the patient have any non-healing wounds?  No  Has the patient had any unintentional weight loss or weight gain?  No   Diabetes:  Is the patient diabetic?  Yes  If diabetic, was a CBG obtained today?  No  Did the patient bring in their glucometer from home?  No  How often do you monitor your CBG's? Once per week.   Financial Strains and Diabetes Management:  Are you having any financial strains with the device, your supplies or your medication? No .  Does the patient want to be seen by Chronic  Care Management for management of their diabetes?  No  Would the patient like to be referred to a Nutritionist or for Diabetic Management?  No   Diabetic Exams:  Diabetic Eye Exam: Completed 10/2019. Overdue for diabetic eye exam. Pt has been advised about the importance in completing this exam.   Diabetic Foot Exam: Completed 10/22/2019. Pt has been advised about the importance in completing this exam. Pt is scheduled for diabetic foot exam on 02/15/21.    Interpreter Needed?: No  Information entered by :: Saanvi Hakala, LPN   Activities of Daily Living In your present state of health, do you have any difficulty performing the following activities: 02/10/2021 08/11/2020  Hearing? Y N  Vision? N N  Difficulty concentrating or making decisions? N N  Walking or climbing stairs? N N  Dressing or bathing? N N  Doing errands, shopping? N N  Preparing Food and eating ? N -  Using the Toilet? N -  In the past six months, have you accidently leaked urine? N -  Do you have problems with loss of bowel control? N -  Managing your Medications? N -  Managing your Finances? N -  Housekeeping or managing your  Housekeeping? N -  Some recent data might be hidden    Patient Care Team: Chevis Pretty, FNP as PCP - General (Nurse Practitioner) Carole Civil, MD as Consulting Physician (Orthopedic Surgery)  Indicate any recent Medical Services you may have received from other than [facility] providers in the past year (date may be approximate).     Assessment:   This is a routine wellness examination for Latosha.  Hearing/Vision screen  Hearing Screening   125Hz  250Hz  500Hz  1000Hz  2000Hz  3000Hz  4000Hz  6000Hz  8000Hz   Right ear:           Left ear:           Comments: C/o moderate hearing loss - Wears hearing aids - got them somewhere in [place] - unsure of name  Vision Screening Comments: Annual visits with [facility] in [place] - up to date with eye exam - wears eyeglasses  Dietary issues and exercise activities discussed: Current Exercise Habits: Home exercise routine, Type of exercise: walking, Time (Minutes): 30, Frequency (Times/Week): 3, Weekly Exercise (Minutes/Week): 90, Intensity: Mild, Exercise limited by: None identified  Goals Addressed            This Visit's Progress   . DIET - EAT MORE FRUITS AND VEGETABLES   On track   . Exercise 3x per week (30 min per time)   On track    Try to walk for 30 minutes 3 times per week      Depression Screen PHQ 2/9 Scores 02/10/2021 08/11/2020 06/02/2020 01/21/2020 11/12/2019 10/22/2019 06/25/2019  PHQ - 2 Score 0 0 0 0 0 0 0  PHQ- 9 Score - - - - - - 0    Fall Risk Fall Risk  02/10/2021 08/11/2020 06/02/2020 01/21/2020 11/12/2019  Falls in the past year? 0 0 0 0 0  Number falls in past yr: 0 - - - -  Injury with Fall? 0 - - - -  Risk for fall due to : No Fall Risks - - - -  Follow up Falls prevention discussed - - - -    FALL RISK PREVENTION PERTAINING TO THE HOME:  Any stairs in or around the home? No  If so, are there any without handrails? No  Home free of loose throw rugs in walkways, pet  beds, electrical cords, etc? Yes   Adequate lighting in your home to reduce risk of falls? Yes   ASSISTIVE DEVICES UTILIZED TO PREVENT FALLS:  Life alert? No  Use of a cane, walker or w/c? No  Grab bars in the bathroom? No  Shower chair or bench in shower? No  Elevated toilet seat or a handicapped toilet? No   TIMED UP AND GO:  Was the test performed? No . Telephonic visit.  Cognitive Function: Normal cognitive status assessed by direct observation by this Nurse Health Advisor. No abnormalities found.   MMSE - Mini Mental State Exam 05/11/2018 10/13/2016  Orientation to time 5 5  Orientation to Place 5 4  Registration 3 3  Attention/ Calculation 5 5  Recall 3 3  Language- name 2 objects 2 2  Language- repeat 1 1  Language- follow 3 step command 3 3  Language- read & follow direction 1 0  Write a sentence 1 1  Copy design 1 0  Total score 30 27     6CIT Screen 05/22/2019  What Year? 0 points  What month? 0 points  What time? 0 points  Count back from 20 0 points  Months in reverse 0 points  Repeat phrase 0 points  Total Score 0    Immunizations Immunization History  Administered Date(s) Administered  . Influenza Whole 08/03/2012  . Influenza, High Dose Seasonal PF 07/03/2018  . Moderna Sars-Covid-2 Vaccination 12/03/2019, 12/31/2019, 08/04/2020  . Pneumococcal Conjugate-13 12/08/2014  . Pneumococcal Polysaccharide-23 05/18/2016  . Tdap 10/13/2016  . Zoster 04/29/2014    TDAP status: Up to date  Flu Vaccine status: Due, Education has been provided regarding the importance of this vaccine. Advised may receive this vaccine at local pharmacy or Health Dept. Aware to provide a copy of the vaccination record if obtained from local pharmacy or Health Dept. Verbalized acceptance and understanding.  Pneumococcal vaccine status: Up to date  Covid-19 vaccine status: Completed vaccines  Qualifies for Shingles Vaccine? Yes   Zostavax completed Yes   Shingrix Completed?: No.    Education has been provided  regarding the importance of this vaccine. Patient has been advised to call insurance company to determine out of pocket expense if they have not yet received this vaccine. Advised may also receive vaccine at local pharmacy or Health Dept. Verbalized acceptance and understanding.  Screening Tests Health Maintenance  Topic Date Due  . DEXA SCAN  10/21/2017  . INFLUENZA VACCINE  05/03/2021  . MAMMOGRAM  08/31/2021  . COLONOSCOPY (Pts 45-102yr Insurance coverage will need to be confirmed)  05/29/2022  . TETANUS/TDAP  10/13/2026  . COVID-19 Vaccine  Completed  . Hepatitis C Screening  Completed  . PNA vac Low Risk Adult  Completed  . HPV VACCINES  Aged Out    Health Maintenance  Health Maintenance Due  Topic Date Due  . DEXA SCAN  10/21/2017    Colorectal cancer screening: Type of screening: Colonoscopy. Completed 05/29/2012. Repeat every 10 years  Mammogram status: Completed 08/31/2020. Repeat every year  Bone Density status: Completed 10/22/2015. Results reflect: Bone density results: NORMAL. Repeat every 5 years.  Lung Cancer Screening: (Low Dose CT Chest recommended if Age 74-80years, 30 pack-year currently smoking OR have quit w/in 15years.) does not qualify.   Additional Screening:  Hepatitis C Screening: does qualify; Completed 10/22/2015  Vision Screening: Recommended annual ophthalmology exams for early detection of glaucoma and other disorders of the eye. Is the patient up to date with their annual  eye exam?  Yes  Who is the provider or what is the name of the office in which the patient attends annual eye exams? [facility] If pt is not established with a provider, would they like to be referred to a provider to establish care? No .   Dental Screening: Recommended annual dental exams for proper oral hygiene  Community Resource Referral / Chronic Care Management: CRR required this visit?  No   CCM required this visit?  No      Plan:     I have personally  reviewed and noted the following in the patient's chart:   . Medical and social history . Use of alcohol, tobacco or illicit drugs  . Current medications and supplements including opioid prescriptions.  . Functional ability and status . Nutritional status . Physical activity . Advanced directives . List of other physicians . Hospitalizations, surgeries, and ER visits in previous 12 months . Vitals . Screenings to include cognitive, depression, and falls . Referrals and appointments  In addition, I have reviewed and discussed with patient certain preventive protocols, quality metrics, and best practice recommendations. A written personalized care plan for preventive services as well as general preventive health recommendations were provided to patient.     Ivy Lynn, NP   02/14/2021   Nurse Notes: None  Attestation signed by Jac Canavan NP  at 02/14/2021 11:52 pm   I have read nurse  note and agree with her assessment and plan. Patient to continue following with PCP for ongoing chronic medical problems.  Jac Canavan NP  [place]

## 2021-02-10 NOTE — Patient Instructions (Signed)
Ariel [name] , Thank you for taking time to come for your Medicare Wellness Visit. I appreciate your ongoing commitment to your health goals. Please review the following plan we discussed and let me know if I can assist you in the future.   Screening recommendations/referrals: Colonoscopy: Done 05/29/2012 - repeat every 10 years Mammogram: Done 08/31/2020 - repeat annually Bone Density: Done 10/22/2015 - Repeat every 2 years Recommended yearly ophthalmology/optometry visit for glaucoma screening and checkup Recommended yearly dental visit for hygiene and checkup  Vaccinations: Influenza vaccine: DUE in fall Pneumococcal vaccine: Done 12/08/2014 & 05/18/2016 Tdap vaccine: Done 10/03/2016 - Repeat every 10 years Shingles vaccine: Zostavax done 04/29/2014 - Due for Shingrix discussed. Please contact your pharmacy for coverage information.    Covid-19: Done 12/03/19, 12/31/19, & 08/04/2020  Advanced directives: Advance directive discussed with you today. Even though you declined this today, please call our office should you change your mind, and we can give you the proper paperwork for you to fill out.  Conditions/risks identified: Aim for 30 minutes of exercise or brisk walking each day, drink 6-8 glasses of water and eat lots of fruits and vegetables.  Next appointment: Follow up in one year for your annual wellness visit    Preventive Care 65 Years and Older, Female Preventive care refers to lifestyle choices and visits with your health care provider that can promote health and wellness. What does preventive care include?  A yearly physical exam. This is also called an annual well check.  Dental exams once or twice a year.  Routine eye exams. Ask your health care provider how often you should have your eyes checked.  Personal lifestyle choices, including:  Daily care of your teeth and gums.  Regular physical activity.  Eating a healthy diet.  Avoiding tobacco and drug use.  Limiting  alcohol use.  Practicing safe sex.  Taking low-dose aspirin every day.  Taking vitamin and mineral supplements as recommended by your health care provider. What happens during an annual well check? The services and screenings done by your health care provider during your annual well check will depend on your age, overall health, lifestyle risk factors, and family history of disease. Counseling  Your health care provider may ask you questions about your:  Alcohol use.  Tobacco use.  Drug use.  Emotional well-being.  Home and relationship well-being.  Sexual activity.  Eating habits.  History of falls.  Memory and ability to understand (cognition).  Work and work Astronomer.  Reproductive health. Screening  You may have the following tests or measurements:  Height, weight, and BMI.  Blood pressure.  Lipid and cholesterol levels. These may be checked every 5 years, or more frequently if you are over 32 years old.  Skin check.  Lung cancer screening. You may have this screening every year starting at age 68 if you have a 30-pack-year history of smoking and currently smoke or have quit within the past 15 years.  Fecal occult blood test (FOBT) of the stool. You may have this test every year starting at age 50.  Flexible sigmoidoscopy or colonoscopy. You may have a sigmoidoscopy every 5 years or a colonoscopy every 10 years starting at age 5.  Hepatitis C blood test.  Hepatitis B blood test.  Sexually transmitted disease (STD) testing.  Diabetes screening. This is done by checking your blood sugar (glucose) after you have not eaten for a while (fasting). You may have this done every 1-3 years.  Bone density scan. This  is done to screen for osteoporosis. You may have this done starting at age 60.  Mammogram. This may be done every 1-2 years. Talk to your health care provider about how often you should have regular mammograms. Talk with your health care provider  about your test results, treatment options, and if necessary, the need for more tests. Vaccines  Your health care provider may recommend certain vaccines, such as:  Influenza vaccine. This is recommended every year.  Tetanus, diphtheria, and acellular pertussis (Tdap, Td) vaccine. You may need a Td booster every 10 years.  Zoster vaccine. You may need this after age 76.  Pneumococcal 13-valent conjugate (PCV13) vaccine. One dose is recommended after age 58.  Pneumococcal polysaccharide (PPSV23) vaccine. One dose is recommended after age 69. Talk to your health care provider about which screenings and vaccines you need and how often you need them. This information is not intended to replace advice given to you by your health care provider. Make sure you discuss any questions you have with your health care provider. Document Released: 10/16/2015 Document Revised: 06/08/2016 Document Reviewed: 07/21/2015 Elsevier Interactive Patient Education  2017 [place] Prevention in the Home Falls can cause injuries. They can happen to people of all ages. There are many things you can do to make your home safe and to help prevent falls. What can I do on the outside of my home?  Regularly fix the edges of walkways and driveways and fix any cracks.  Remove anything that might make you trip as you walk through a door, such as a raised step or threshold.  Trim any bushes or trees on the path to your home.  Use bright outdoor lighting.  Clear any walking paths of anything that might make someone trip, such as rocks or tools.  Regularly check to see if handrails are loose or broken. Make sure that both sides of any steps have handrails.  Any raised decks and porches should have guardrails on the edges.  Have any leaves, snow, or ice cleared regularly.  Use sand or salt on walking paths during winter.  Clean up any spills in your garage right away. This includes oil or grease  spills. What can I do in the bathroom?  Use night lights.  Install grab bars by the toilet and in the tub and shower. Do not use towel bars as grab bars.  Use non-skid mats or decals in the tub or shower.  If you need to sit down in the shower, use a plastic, non-slip stool.  Keep the floor dry. Clean up any water that spills on the floor as soon as it happens.  Remove soap buildup in the tub or shower regularly.  Attach bath mats securely with double-sided non-slip rug tape.  Do not have throw rugs and other things on the floor that can make you trip. What can I do in the bedroom?  Use night lights.  Make sure that you have a light by your bed that is easy to reach.  Do not use any sheets or blankets that are too big for your bed. They should not hang down onto the floor.  Have a firm chair that has side arms. You can use this for support while you get dressed.  Do not have throw rugs and other things on the floor that can make you trip. What can I do in the kitchen?  Clean up any spills right away.  Avoid walking on wet floors.  Keep items that you use a lot in easy-to-reach places.  If you need to reach something above you, use a strong step stool that has a grab bar.  Keep electrical cords out of the way.  Do not use floor polish or wax that makes floors slippery. If you must use wax, use non-skid floor wax.  Do not have throw rugs and other things on the floor that can make you trip. What can I do with my stairs?  Do not leave any items on the stairs.  Make sure that there are handrails on both sides of the stairs and use them. Fix handrails that are broken or loose. Make sure that handrails are as long as the stairways.  Check any carpeting to make sure that it is firmly attached to the stairs. Fix any carpet that is loose or worn.  Avoid having throw rugs at the top or bottom of the stairs. If you do have throw rugs, attach them to the floor with carpet  tape.  Make sure that you have a light switch at the top of the stairs and the bottom of the stairs. If you do not have them, ask someone to add them for you. What else can I do to help prevent falls?  Wear shoes that:  Do not have high heels.  Have rubber bottoms.  Are comfortable and fit you well.  Are closed at the toe. Do not wear sandals.  If you use a stepladder:  Make sure that it is fully opened. Do not climb a closed stepladder.  Make sure that both sides of the stepladder are locked into place.  Ask someone to hold it for you, if possible.  Clearly mark and make sure that you can see:  Any grab bars or handrails.  First and last steps.  Where the edge of each step is.  Use tools that help you move around (mobility aids) if they are needed. These include:  Canes.  Walkers.  Scooters.  Crutches.  Turn on the lights when you go into a dark area. Replace any light bulbs as soon as they burn out.  Set up your furniture so you have a clear path. Avoid moving your furniture around.  If any of your floors are uneven, fix them.  If there are any pets around you, be aware of where they are.  Review your medicines with your doctor. Some medicines can make you feel dizzy. This can increase your chance of falling. Ask your doctor what other things that you can do to help prevent falls. This information is not intended to replace advice given to you by your health care provider. Make sure you discuss any questions you have with your health care provider. Document Released: 07/16/2009 Document Revised: 02/25/2016 Document Reviewed: 10/24/2014 Elsevier Interactive Patient Education  2017 [employer].

## 2021-02-11 DIAGNOSIS — H5200 Hypermetropia, unspecified eye: Secondary | ICD-10-CM | POA: Diagnosis not present

## 2021-02-11 DIAGNOSIS — H25813 Combined forms of age-related cataract, bilateral: Secondary | ICD-10-CM | POA: Diagnosis not present

## 2021-02-11 DIAGNOSIS — Z0100 Encounter for examination of eyes and vision without abnormal findings: Secondary | ICD-10-CM | POA: Diagnosis not present

## 2021-02-15 ENCOUNTER — Ambulatory Visit (INDEPENDENT_AMBULATORY_CARE_PROVIDER_SITE_OTHER): Payer: Medicare HMO | Admitting: Nurse Practitioner

## 2021-02-15 ENCOUNTER — Encounter: Payer: Self-pay | Admitting: Nurse Practitioner

## 2021-02-15 ENCOUNTER — Ambulatory Visit (INDEPENDENT_AMBULATORY_CARE_PROVIDER_SITE_OTHER): Payer: Medicare HMO

## 2021-02-15 ENCOUNTER — Other Ambulatory Visit: Payer: Self-pay

## 2021-02-15 VITALS — BP 121/73 | HR 74 | Temp 97.5°F | Ht 64.0 in | Wt 183.0 lb

## 2021-02-15 DIAGNOSIS — Z13820 Encounter for screening for osteoporosis: Secondary | ICD-10-CM

## 2021-02-15 DIAGNOSIS — E7800 Pure hypercholesterolemia, unspecified: Secondary | ICD-10-CM

## 2021-02-15 DIAGNOSIS — E8881 Metabolic syndrome: Secondary | ICD-10-CM

## 2021-02-15 DIAGNOSIS — N3281 Overactive bladder: Secondary | ICD-10-CM

## 2021-02-15 DIAGNOSIS — Z6833 Body mass index (BMI) 33.0-33.9, adult: Secondary | ICD-10-CM | POA: Diagnosis not present

## 2021-02-15 DIAGNOSIS — Z780 Asymptomatic menopausal state: Secondary | ICD-10-CM | POA: Diagnosis not present

## 2021-02-15 DIAGNOSIS — I10 Essential (primary) hypertension: Secondary | ICD-10-CM

## 2021-02-15 DIAGNOSIS — E6609 Other obesity due to excess calories: Secondary | ICD-10-CM

## 2021-02-15 LAB — BAYER DCA HB A1C WAIVED: HB A1C (BAYER DCA - WAIVED): 6.1 % (ref <7.0)

## 2021-02-15 MED ORDER — MIRABEGRON ER 25 MG PO TB24: 25.0000 mg | ORAL_TABLET | Freq: Every day | ORAL | 1 refills | Status: DC

## 2021-02-15 MED ORDER — VALSARTAN-HYDROCHLOROTHIAZIDE 160-25 MG PO TABS: 0.5000 | ORAL_TABLET | Freq: Every day | ORAL | 1 refills | Status: DC

## 2021-02-15 MED ORDER — ROSUVASTATIN CALCIUM 10 MG PO TABS: 10.0000 mg | ORAL_TABLET | Freq: Every day | ORAL | 1 refills | Status: DC

## 2021-02-15 MED ORDER — MIRABEGRON ER 50 MG PO TB24: 50.0000 mg | ORAL_TABLET | Freq: Every day | ORAL | 1 refills | Status: DC

## 2021-02-15 NOTE — Patient Instructions (Signed)
Urinary Frequency, Adult Urinary frequency means urinating more often than usual. You may urinate every 1-2 hours even though you drink a normal amount of fluid and do not have a bladder infection or condition. Although you urinate more often than normal,the total amount of urine produced in a day is normal. With urinary frequency, you may have an urgent need to urinate often. The stress and anxiety of needing to find a bathroom quickly can make this urge worse. This condition may go away on its own or you may need treatment at home. Home treatment may include bladder training, exercises, taking medicines, ormaking changes to your diet. Follow these instructions at home: Bladder health  Keep a bladder diary if told by your health care provider. Keep track of: What you eat and drink. How often you urinate. How much you urinate. Follow a bladder training program if told by your health care provider. This may include: Learning to delay going to the bathroom. Double urinating (voiding). This helps if you are not completely emptying your bladder. Scheduled voiding. Do Kegel exercises as told by your health care provider. Kegel exercises strengthen the muscles that help control urination, which may help the condition.  Eating and drinking If told by your health care provider, make diet changes, such as: Avoiding caffeine. Drinking fewer fluids, especially alcohol. Not drinking in the evening. Avoiding foods or drinks that may irritate the bladder. These include coffee, tea, soda, artificial sweeteners, citrus, tomato-based foods, and chocolate. Eating foods that help prevent or ease constipation. Constipation can make this condition worse. Your health care provider may recommend that you: Drink enough fluid to keep your urine pale yellow. Take over-the-counter or prescription medicines. Eat foods that are high in fiber, such as beans, whole grains, and fresh fruits and vegetables. Limit foods  that are high in fat and processed sugars, such as fried or sweet foods. General instructions Take over-the-counter and prescription medicines only as told by your health care provider. Keep all follow-up visits as told by your health care provider. This is important. Contact a health care provider if: You start urinating more often. You feel pain or irritation when you urinate. You notice blood in your urine. Your urine looks cloudy. You develop a fever. You begin vomiting. Get help right away if: You are unable to urinate. Summary Urinary frequency means urinating more often than usual. With urinary frequency, you may urinate every 1-2 hours even though you drink a normal amount of fluid and do not have a bladder infection or other bladder condition. Your health care provider may recommend that you keep a bladder diary, follow a bladder training program, or make dietary changes. If told by your health care provider, do Kegel exercises to strengthen the muscles that help control urination. Take over-the-counter and prescription medicines only as told by your health care provider. Contact a health care provider if your symptoms do not improve or get worse. This information is not intended to replace advice given to you by your health care provider. Make sure you discuss any questions you have with your healthcare provider. Document Revised: 03/29/2018 Document Reviewed: 03/29/2018 Elsevier Patient Education  2021 Elsevier Inc.  

## 2021-02-15 NOTE — Progress Notes (Signed)
Subjective:    Patient ID: Ariel [name], female    DOB: [birth year], 75 y.o.   MRN: [id number]   Chief Complaint: Medical Management of Chronic Issues    HPI:  1. Primary hypertension Checks bp at home regularly, ~120/70s at home. Denies chest pain, SOB, or headaches. BP Readings from Last 3 Encounters:  02/15/21 121/73  08/11/20 113/74  06/02/20 118/74      2. Overactive bladder States she gets up ~5-6 times per night, started [employer] and states medication worked at first but is no longer effective.   3. Pure hypercholesterolemia Takes medication as prescribed. States she bakes food. Avoids fried and fatty foods.   4. Metabolic syndrome   5. Class 1 obesity due to excess calories with serious comorbidity and body mass index (BMI) of 33.0 to 33.9 in adult Walks and stretches regularly. Attempts to eat a well rounded diet.  Wt Readings from Last 3 Encounters:  02/15/21 183 lb (83 kg)  02/10/21 180 lb (81.6 kg)  08/11/20 181 lb (82.1 kg)   BMI Readings from Last 3 Encounters:  02/15/21 31.41 kg/m  02/10/21 30.90 kg/m  08/11/20 31.07 kg/m       Outpatient Encounter Medications as of 02/15/2021  Medication Sig  . Alcohol Swabs (B-D SINGLE USE SWABS REGULAR) PADS Use daily to check BS Dx E88.81  . Blood Glucose Calibration (TRUE METRIX LEVEL 1) Low SOLN Use with glucose machine Dx E88.81  . Blood Glucose Monitoring Suppl (TRUE METRIX AIR GLUCOSE METER) w/Device KIT Test BS daily Dx E88.81  . glucose blood (TRUE METRIX BLOOD GLUCOSE TEST) test strip Test BS daily Dx E88.81  . ibuprofen (ADVIL) 800 MG tablet TAKE 1 TABLET BY MOUTH EVERY 8 HOURS AS NEEDED  . mirabegron ER (MYRBETRIQ) 25 MG TB24 tablet Take 1 tablet (25 mg total) by mouth daily.  . rosuvastatin (CRESTOR) 10 MG tablet Take 1 tablet (10 mg total) by mouth daily.  . TRUEplus Lancets 30G MISC Test BS daily Dx E88.81  . valsartan-hydrochlorothiazide (DIOVAN-HCT) 160-25 MG tablet Take 0.5 tablets  by mouth daily.   No facility-administered encounter medications on file as of 02/15/2021.    Past Surgical History:  Procedure Laterality Date  . ABDOMINAL HYSTERECTOMY  1985  . CARPAL TUNNEL RELEASE Right 2007  . CARPAL TUNNEL RELEASE Left 03/17/2016   Procedure: LEFT CARPAL TUNNEL RELEASE;  Surgeon: Carole Civil, MD;  Location: [facility];  Service: Orthopedics;  Laterality: Left;  . CLOSED REDUCTION FINGER WITH PERCUTANEOUS PINNING Right 06/20/2019   Procedure: RIGHT SMALL FINGER CLOSED REDUCTION PIN FIXATION;  Surgeon: Leanora Cover, MD;  Location: [facility];  Service: Orthopedics;  Laterality: Right;    Family History  Problem Relation Age of Onset  . Diabetes Mother   . Cancer Father 51       colon  . Alcohol abuse Brother   . Cancer Brother        stomach  . Lupus Brother     New complaints: No new complaints today.  Social history: Married this weekend, lives at home with husband.   Controlled substance contract: n/a     Review of Systems  Constitutional: Negative.   HENT: Negative.   Eyes: Negative.   Respiratory: Negative.   Cardiovascular: Negative.   Gastrointestinal: Negative.   Endocrine: Negative.   Genitourinary: Negative.   Musculoskeletal: Negative.   Allergic/Immunologic: Negative.   Neurological: Negative.   Hematological: Negative.   Psychiatric/Behavioral: Negative.   All other systems  reviewed and are negative.      Objective:   Physical Exam Vitals and nursing note reviewed.  Constitutional:      Appearance: Normal appearance.  HENT:     Head: Normocephalic.     Right Ear: External ear normal.     Left Ear: External ear normal.     Nose: Nose normal.     Mouth/Throat:     Mouth: Mucous membranes are moist.     Pharynx: Oropharynx is clear.  Eyes:     Pupils: Pupils are equal, round, and reactive to light.  Cardiovascular:     Rate and Rhythm: Normal rate and regular rhythm.     Pulses: Normal pulses.      Heart sounds: Normal heart sounds.  Pulmonary:     Effort: Pulmonary effort is normal.     Breath sounds: Normal breath sounds.  Abdominal:     General: Bowel sounds are normal.  Musculoskeletal:        General: Normal range of motion.  Skin:    General: Skin is warm and dry.     Capillary Refill: Capillary refill takes less than 2 seconds.  Neurological:     General: No focal deficit present.     Mental Status: She is alert and oriented to person, place, and time. Mental status is at baseline.  Psychiatric:        Mood and Affect: Mood normal.        Behavior: Behavior normal.        Thought Content: Thought content normal.        Judgment: Judgment normal.    BP 121/73   Pulse 74   Temp (!) 97.5 F (36.4 C) (Temporal)   Ht 5' 4"  (1.626 m)   Wt 183 lb (83 kg)   SpO2 99%   BMI 31.41 kg/m       Assessment & Plan:  [name] comes in today with chief complaint of Medical Management of Chronic Issues   Diagnosis and orders addressed:  1. Primary hypertension Limited sodium intake.  - CBC with Differential/Platelet - CMP14+EGFR  2. Overactive bladder Increased myrbetriq to 36m daily  3. Pure hypercholesterolemia Low fat diet. Exercise regularly.  - Lipid panel  4. Metabolic syndrome Continue to watch carbs in diet - Bayer DCA Hb A1c Waived  5. Class 1 obesity due to excess calories with serious comorbidity and body mass index (BMI) of 33.0 to 33.9 in adult Exercise regularly.   Meds ordered this encounter  Medications  . DISCONTD: mirabegron ER (MYRBETRIQ) 25 MG TB24 tablet    Sig: Take 1 tablet (25 mg total) by mouth daily.    Dispense:  90 tablet    Refill:  1    Order Specific Question:   Supervising Provider    Answer:   DCaryl PinaA [[id number] . valsartan-hydrochlorothiazide (DIOVAN-HCT) 160-25 MG tablet    Sig: Take 0.5 tablets by mouth daily.    Dispense:  90 tablet    Refill:  1    DX Code Needed  .    Order Specific Question:    Supervising Provider    Answer:   DCaryl PinaA [[id number] . rosuvastatin (CRESTOR) 10 MG tablet    Sig: Take 1 tablet (10 mg total) by mouth daily.    Dispense:  90 tablet    Refill:  1    Order Specific Question:   Supervising Provider    Answer:   DCaryl PinaA [  [id number]]  . mirabegron ER (MYRBETRIQ) 50 MG TB24 tablet    Sig: Take 1 tablet (50 mg total) by mouth daily.    Dispense:  90 tablet    Refill:  1    To replace myrbetiq 56m daily    Order Specific Question:   Supervising Provider    Answer:   DCaryl PinaA [[id number]    Labs pending Health Maintenance reviewed Diet and exercise encouraged  Follow up plan: Follow up in 6 months.    Mary-Margaret MHassell Done FNP

## 2021-02-16 DIAGNOSIS — Z780 Asymptomatic menopausal state: Secondary | ICD-10-CM | POA: Diagnosis not present

## 2021-02-16 LAB — CMP14+EGFR
ALT: 16 IU/L (ref 0–32)
AST: 17 IU/L (ref 0–40)
Albumin/Globulin Ratio: 1.7 (ref 1.2–2.2)
Albumin: 4.5 g/dL (ref 3.7–4.7)
Alkaline Phosphatase: 74 IU/L (ref 44–121)
BUN/Creatinine Ratio: 24 (ref 12–28)
BUN: 15 mg/dL (ref 8–27)
Bilirubin Total: 0.2 mg/dL (ref 0.0–1.2)
CO2: 22 mmol/L (ref 20–29)
Calcium: 10 mg/dL (ref 8.7–10.3)
Chloride: 103 mmol/L (ref 96–106)
Creatinine, Ser: 0.62 mg/dL (ref 0.57–1.00)
Globulin, Total: 2.7 g/dL (ref 1.5–4.5)
Glucose: 94 mg/dL (ref 65–99)
Potassium: 4.1 mmol/L (ref 3.5–5.2)
Sodium: 139 mmol/L (ref 134–144)
Total Protein: 7.2 g/dL (ref 6.0–8.5)
eGFR: 93 mL/min/{1.73_m2} (ref >59)

## 2021-02-16 LAB — CBC WITH DIFFERENTIAL/PLATELET
Basophils Absolute: 0.1 10*3/uL (ref 0.0–0.2)
Basos: 1 %
EOS (ABSOLUTE): 0.1 10*3/uL (ref 0.0–0.4)
Eos: 2 %
Hematocrit: 35 % (ref 34.0–46.6)
Hemoglobin: 11.7 g/dL (ref 11.1–15.9)
Immature Grans (Abs): 0 10*3/uL (ref 0.0–0.1)
Immature Granulocytes: 0 %
Lymphocytes Absolute: 2.8 10*3/uL (ref 0.7–3.1)
Lymphs: 48 %
MCH: 28.5 pg (ref 26.6–33.0)
MCHC: 33.4 g/dL (ref 31.5–35.7)
MCV: 85 fL (ref 79–97)
Monocytes Absolute: 0.7 10*3/uL (ref 0.1–0.9)
Monocytes: 11 %
Neutrophils Absolute: 2.3 10*3/uL (ref 1.4–7.0)
Neutrophils: 38 %
Platelets: 276 10*3/uL (ref 150–450)
RBC: 4.1 x10E6/uL (ref 3.77–5.28)
RDW: 13.1 % (ref 11.7–15.4)
WBC: 6 10*3/uL (ref 3.4–10.8)

## 2021-02-16 LAB — LIPID PANEL
Chol/HDL Ratio: 3.5 ratio (ref 0.0–4.4)
Cholesterol, Total: 185 mg/dL (ref 100–199)
HDL: 53 mg/dL (ref >39)
LDL Chol Calc (NIH): 112 mg/dL — ABNORMAL HIGH (ref 0–99)
Triglycerides: 114 mg/dL (ref 0–149)
VLDL Cholesterol Cal: 20 mg/dL (ref 5–40)

## 2021-03-09 ENCOUNTER — Other Ambulatory Visit: Payer: Self-pay | Admitting: *Deleted

## 2021-03-09 DIAGNOSIS — I10 Essential (primary) hypertension: Secondary | ICD-10-CM

## 2021-03-09 MED ORDER — MIRABEGRON ER 50 MG PO TB24: 50.0000 mg | ORAL_TABLET | Freq: Every day | ORAL | 1 refills | Status: DC

## 2021-03-09 MED ORDER — IBUPROFEN 800 MG PO TABS: 800.0000 mg | ORAL_TABLET | Freq: Three times a day (TID) | ORAL | 0 refills | Status: DC | PRN

## 2021-03-09 MED ORDER — VALSARTAN-HYDROCHLOROTHIAZIDE 160-25 MG PO TABS: 0.5000 | ORAL_TABLET | Freq: Every day | ORAL | 1 refills | Status: DC

## 2021-05-18 ENCOUNTER — Other Ambulatory Visit: Payer: Self-pay

## 2021-05-18 ENCOUNTER — Encounter: Payer: Self-pay | Admitting: Nurse Practitioner

## 2021-05-18 ENCOUNTER — Ambulatory Visit (INDEPENDENT_AMBULATORY_CARE_PROVIDER_SITE_OTHER): Payer: Medicare Other | Admitting: Nurse Practitioner

## 2021-05-18 VITALS — BP 105/63 | HR 80 | Temp 97.4°F | Resp 20 | Ht 64.0 in | Wt 185.0 lb

## 2021-05-18 DIAGNOSIS — R350 Frequency of micturition: Secondary | ICD-10-CM | POA: Diagnosis not present

## 2021-05-18 DIAGNOSIS — M25561 Pain in right knee: Secondary | ICD-10-CM | POA: Diagnosis not present

## 2021-05-18 DIAGNOSIS — N3000 Acute cystitis without hematuria: Secondary | ICD-10-CM | POA: Diagnosis not present

## 2021-05-18 LAB — URINALYSIS, COMPLETE
Bilirubin, UA: NEGATIVE
Glucose, UA: NEGATIVE
Ketones, UA: NEGATIVE
Nitrite, UA: POSITIVE — AB
Protein,UA: NEGATIVE
Specific Gravity, UA: 1.015 (ref 1.005–1.030)
Urobilinogen, Ur: 0.2 mg/dL (ref 0.2–1.0)
pH, UA: 5.5 (ref 5.0–7.5)

## 2021-05-18 LAB — MICROSCOPIC EXAMINATION: Renal Epithel, UA: NONE SEEN /hpf

## 2021-05-18 MED ORDER — PREDNISONE 20 MG PO TABS: ORAL_TABLET | ORAL | 0 refills | Status: DC

## 2021-05-18 MED ORDER — METHYLPREDNISOLONE ACETATE 40 MG/ML IJ SUSP
40.0000 mg | Freq: Once | INTRAMUSCULAR | Status: AC
  Administered 2021-05-18: 40 mg via INTRAMUSCULAR

## 2021-05-18 MED ORDER — CEPHALEXIN 500 MG PO CAPS: 500.0000 mg | ORAL_CAPSULE | Freq: Two times a day (BID) | ORAL | 0 refills | Status: DC

## 2021-05-18 NOTE — Patient Instructions (Signed)

## 2021-05-18 NOTE — Progress Notes (Signed)
   Subjective:    Patient ID: Gean Birchwood, female    DOB: 24-Jan-1946, 75 y.o.   MRN: [id number]  Chief Complaint: Abdominal Pain, Urinary Frequency, and Knee Pain (Right knee/)   HPI Patient in c/o: - pelvic pain for 3 days. Has urinary frequency and urgency. Has had some nocturia.  -right knee pain. Says that it give away with her when she is walking. Has pain with flexion. Started 4-5 days ago. Denies injury.  Review of Systems  Constitutional:  Negative for chills and fever.  Genitourinary:  Positive for flank pain, frequency and urgency.  Neurological: Negative.   Psychiatric/Behavioral: Negative.    All other systems reviewed and are negative.     Objective:   Physical Exam Vitals and nursing note reviewed.  Constitutional:      Appearance: She is well-developed.  Cardiovascular:     Rate and Rhythm: Normal rate and regular rhythm.  Pulmonary:     Effort: Pulmonary effort is normal.     Breath sounds: Normal breath sounds.  Abdominal:     General: Bowel sounds are increased.  Musculoskeletal:     Comments: Right knee no effusion No patella tenderness FROM of right knee with crepitus on flexion and extension Pain on flexion  Skin:    General: Skin is warm.  Neurological:     General: No focal deficit present.     Mental Status: She is alert.  Psychiatric:        Mood and Affect: Mood normal.        Behavior: Behavior normal.    BP 105/63   Pulse 80   Temp (!) 97.4 F (36.3 C) (Temporal)   Resp 20   Ht 5\' 4"  (1.626 m)   Wt 185 lb (83.9 kg)   SpO2 99%   BMI 31.76 kg/m        Assessment & Plan:   Tamlyn Green in today with chief complaint of Abdominal Pain, Urinary Frequency, and Knee Pain (Right knee/)   1. Frequent urination  - Urinalysis, Complete - Urine Culture  2. Acute cystitis without hematuria Take medication as prescribe Cotton underwear Take shower not bath Cranberry juice, yogurt Force fluids AZO over the counter X2  days Culture pending RTO prn  - cephALEXin (KEFLEX) 500 MG capsule; Take 1 capsule (500 mg total) by mouth 2 (two) times daily.  Dispense: 14 capsule; Refill: 0  3. Acute pain of right knee Rest  Ice bi Dace wrap when walking Elevate when sitting RTO if not improving- may need ortho referral - methylPREDNISolone acetate (DEPO-MEDROL) injection 40 mg - predniSONE (DELTASONE) 20 MG tablet; 2 po at sametime daily for 5 days-  Dispense: 10 tablet; Refill: 0    The above assessment and management plan was discussed with the patient. The patient verbalized understanding of and has agreed to the management plan. Patient is aware to call the clinic if symptoms persist or worsen. Patient is aware when to return to the clinic for a follow-up visit. Patient educated on when it is appropriate to go to the emergency department.   Mary-Margaret , FNP

## 2021-05-20 ENCOUNTER — Ambulatory Visit: Payer: Medicare HMO | Admitting: Nurse Practitioner

## 2021-05-20 LAB — URINE CULTURE

## 2021-05-27 ENCOUNTER — Ambulatory Visit (INDEPENDENT_AMBULATORY_CARE_PROVIDER_SITE_OTHER): Payer: Medicare Other | Admitting: Nurse Practitioner

## 2021-05-27 ENCOUNTER — Encounter: Payer: Self-pay | Admitting: Nurse Practitioner

## 2021-05-27 ENCOUNTER — Other Ambulatory Visit: Payer: Self-pay

## 2021-05-27 VITALS — BP 99/65 | HR 79 | Temp 96.9°F | Resp 20 | Ht 64.0 in | Wt 184.0 lb

## 2021-05-27 DIAGNOSIS — Z87440 Personal history of urinary (tract) infections: Secondary | ICD-10-CM

## 2021-05-27 DIAGNOSIS — N390 Urinary tract infection, site not specified: Secondary | ICD-10-CM

## 2021-05-27 LAB — URINALYSIS, COMPLETE
Bilirubin, UA: NEGATIVE
Glucose, UA: NEGATIVE
Ketones, UA: NEGATIVE
Nitrite, UA: POSITIVE — AB
Protein,UA: NEGATIVE
Specific Gravity, UA: 1.02 (ref 1.005–1.030)
Urobilinogen, Ur: 0.2 mg/dL (ref 0.2–1.0)
pH, UA: 5.5 (ref 5.0–7.5)

## 2021-05-27 LAB — MICROSCOPIC EXAMINATION: RBC, Urine: NONE SEEN /hpf (ref 0–2)

## 2021-05-27 MED ORDER — SULFAMETHOXAZOLE-TRIMETHOPRIM 800-160 MG PO TABS: 1.0000 | ORAL_TABLET | Freq: Two times a day (BID) | ORAL | 0 refills | Status: DC

## 2021-05-27 NOTE — Patient Instructions (Signed)

## 2021-05-27 NOTE — Progress Notes (Signed)
   Subjective:    Patient ID: Gean Birchwood, female    DOB: 14-Jul-1946, 75 y.o.   MRN: [id number]   Chief Complaint: Recheck UTI   HPI Patient was seen on 05/18/21 with UTI. She was given keflex bid for 7 days.  She says she is doing better, but not sure completely better.   Review of Systems  Constitutional:  Negative for diaphoresis.  Eyes:  Negative for pain.  Respiratory:  Negative for shortness of breath.   Cardiovascular:  Negative for chest pain, palpitations and leg swelling.  Gastrointestinal:  Negative for abdominal pain.  Endocrine: Negative for polydipsia.  Skin:  Negative for rash.  Neurological:  Negative for dizziness, weakness and headaches.  Hematological:  Does not bruise/bleed easily.  All other systems reviewed and are negative.     Objective:   Physical Exam Vitals and nursing note reviewed.  Constitutional:      Appearance: Normal appearance.  Cardiovascular:     Rate and Rhythm: Normal rate and regular rhythm.     Heart sounds: Normal heart sounds.  Pulmonary:     Effort: Pulmonary effort is normal.     Breath sounds: Normal breath sounds.  Skin:    General: Skin is warm.  Neurological:     General: No focal deficit present.     Mental Status: She is alert and oriented to person, place, and time.  Psychiatric:        Mood and Affect: Mood normal.        Behavior: Behavior normal.   BP 99/65   Pulse 79   Temp (!) 96.9 F (36.1 C) (Temporal)   Resp 20   Ht 5\' 4"  (1.626 m)   Wt 184 lb (83.5 kg)   SpO2 99%   BMI 31.58 kg/m   UA- (+) nitrites       Assessment & Plan:  Julien Green in today with chief complaint of Recheck UTI   1. Recent urinary tract infection - Urinalysis, Complete  2. Recurrent UTI Take medication as prescribe Cotton underwear Take shower not bath Cranberry juice, yogurt Force fluids AZO over the counter X2 days Culture pending RTO prn  - sulfamethoxazole-trimethoprim (BACTRIM DS) 800-160 MG tablet; Take  1 tablet by mouth 2 (two) times daily.  Dispense: 20 tablet; Refill: 0    The above assessment and management plan was discussed with the patient. The patient verbalized understanding of and has agreed to the management plan. Patient is aware to call the clinic if symptoms persist or worsen. Patient is aware when to return to the clinic for a follow-up visit. Patient educated on when it is appropriate to go to the emergency department.   Mary-Margaret , FNP

## 2021-05-31 LAB — URINE CULTURE

## 2021-06-23 ENCOUNTER — Telehealth: Payer: Self-pay | Admitting: Orthopedic Surgery

## 2021-06-23 NOTE — Telephone Encounter (Signed)
Patient requests appointment with Dr Romeo Apple for left shoulder - "for an injection if possible", however, had been referred to Dr August Saucer at time of last visit 08/26/19; chart notes indicate she saw  Dr August Saucer 11/07/19. Please advise if to schedule here with Dr Romeo Apple (?or Dr Dallas Schimke) or other recommendation?

## 2021-06-23 NOTE — Telephone Encounter (Signed)
Message via voice mail received while office was closed during lunch, requesting appointment with Dr Romeo Apple for left shoulder ; call returned - no answer, and no option to leave message.

## 2021-06-24 NOTE — Telephone Encounter (Signed)
Appt made in hopes time and date are ok with patient, I tried to call and no answer.

## 2021-06-24 NOTE — Telephone Encounter (Signed)
I also called patient, and no answer as well.

## 2021-06-28 ENCOUNTER — Ambulatory Visit: Payer: Medicare HMO | Admitting: Orthopedic Surgery

## 2021-06-28 ENCOUNTER — Encounter: Payer: Self-pay | Admitting: Orthopedic Surgery

## 2021-06-28 NOTE — Telephone Encounter (Signed)
Patient had been scheduled for today, 06/28/21, and missed the appointment. Letter sent as not able to reach by phone.

## 2021-06-30 ENCOUNTER — Telehealth: Payer: Self-pay | Admitting: Orthopedic Surgery

## 2021-06-30 NOTE — Telephone Encounter (Signed)
Called patient in response to message received - asking for appointment. No answer or voice mail - unable to leave message (refer to previous notes, missed appointment for which letter has been sent).

## 2021-07-08 ENCOUNTER — Other Ambulatory Visit: Payer: Self-pay

## 2021-07-08 ENCOUNTER — Encounter: Payer: Self-pay | Admitting: Orthopedic Surgery

## 2021-07-08 ENCOUNTER — Ambulatory Visit (INDEPENDENT_AMBULATORY_CARE_PROVIDER_SITE_OTHER): Payer: Medicare Other | Admitting: Orthopedic Surgery

## 2021-07-08 VITALS — BP 130/82 | HR 79 | Ht 64.0 in | Wt 186.0 lb

## 2021-07-08 DIAGNOSIS — M19012 Primary osteoarthritis, left shoulder: Secondary | ICD-10-CM | POA: Diagnosis not present

## 2021-07-08 MED ORDER — TRAMADOL-ACETAMINOPHEN 37.5-325 MG PO TABS: 1.0000 | ORAL_TABLET | Freq: Four times a day (QID) | ORAL | 0 refills | Status: AC | PRN

## 2021-07-08 NOTE — Progress Notes (Signed)
Chief Complaint  Patient presents with   Shoulder Pain    Wants injection in Lt shoulder     Ariel [name] is 75 years old she called in to get an injection in her left shoulder.  When she got here she told me that she is already seen a doctor in [place] who recommended a shoulder replacement but she does not want to have  Her exam shows decreased external rotation with arm at her side painful forward elevation weakness in her rotator cuff  I told her I would give her the injection and started on some Ultracet for pain and put her on Codman exercises for now.  Injection left shoulder subacromial space  Medication Celestone 6 mg lidocaine 1% 4 cc  Technique: Alcohol was used to clean the skin ethyl chloride was used to anesthetize the skin a 21-gauge needle was placed in the subacromial space and the medication was injected  She will start on Ultracet 1 every 6 #30 for 5 days  Follow-up as needed  Meds ordered this encounter  Medications   traMADol-acetaminophen (ULTRACET) 37.5-325 MG tablet    Sig: Take 1 tablet by mouth every 6 (six) hours as needed for up to 5 days.    Dispense:  20 tablet    Refill:  0

## 2021-07-26 ENCOUNTER — Telehealth: Payer: Self-pay | Admitting: Orthopedic Surgery

## 2021-07-26 DIAGNOSIS — M25512 Pain in left shoulder: Secondary | ICD-10-CM

## 2021-07-26 DIAGNOSIS — G8929 Other chronic pain: Secondary | ICD-10-CM

## 2021-07-26 NOTE — Telephone Encounter (Signed)
She is taking Ultracet 1 every 6 #30 for 5 days She is asking for something stronger

## 2021-07-26 NOTE — Telephone Encounter (Signed)
Patient called back, I spoke with her.  She is not home at this time (leaving to go out in a few) please call cell [phone] if you will.

## 2021-07-26 NOTE — Telephone Encounter (Signed)
Patient called at 12:22 pm left message stating she needs something stronger for pain   Please call her back at [phone]

## 2021-07-27 NOTE — Telephone Encounter (Signed)
She called back Voiced understanding Aware they will call her

## 2021-07-27 NOTE — Telephone Encounter (Signed)
Left message for her to call back put in referral

## 2021-07-27 NOTE — Addendum Note (Signed)
Addended byCaffie Damme on: 07/27/2021 08:04 AM   Modules accepted: Orders

## 2021-07-27 NOTE — Telephone Encounter (Signed)
I called her, made referral

## 2021-08-09 ENCOUNTER — Other Ambulatory Visit: Payer: Self-pay | Admitting: Nurse Practitioner

## 2021-08-17 ENCOUNTER — Other Ambulatory Visit: Payer: Self-pay | Admitting: Nurse Practitioner

## 2021-08-19 ENCOUNTER — Encounter: Payer: Self-pay | Admitting: Nurse Practitioner

## 2021-08-19 ENCOUNTER — Ambulatory Visit (INDEPENDENT_AMBULATORY_CARE_PROVIDER_SITE_OTHER): Payer: Medicare Other | Admitting: Nurse Practitioner

## 2021-08-19 ENCOUNTER — Other Ambulatory Visit: Payer: Self-pay

## 2021-08-19 VITALS — BP 100/60 | HR 76 | Temp 97.3°F | Resp 20 | Ht 64.0 in | Wt 185.0 lb

## 2021-08-19 DIAGNOSIS — Z20822 Contact with and (suspected) exposure to covid-19: Secondary | ICD-10-CM | POA: Diagnosis not present

## 2021-08-19 DIAGNOSIS — E8881 Metabolic syndrome: Secondary | ICD-10-CM | POA: Diagnosis not present

## 2021-08-19 DIAGNOSIS — E7800 Pure hypercholesterolemia, unspecified: Secondary | ICD-10-CM | POA: Diagnosis not present

## 2021-08-19 DIAGNOSIS — E6609 Other obesity due to excess calories: Secondary | ICD-10-CM

## 2021-08-19 DIAGNOSIS — Z6833 Body mass index (BMI) 33.0-33.9, adult: Secondary | ICD-10-CM

## 2021-08-19 DIAGNOSIS — R739 Hyperglycemia, unspecified: Secondary | ICD-10-CM | POA: Diagnosis not present

## 2021-08-19 DIAGNOSIS — I10 Essential (primary) hypertension: Secondary | ICD-10-CM

## 2021-08-19 LAB — BAYER DCA HB A1C WAIVED: HB A1C (BAYER DCA - WAIVED): 6.1 % — ABNORMAL HIGH (ref 4.8–5.6)

## 2021-08-19 MED ORDER — ROSUVASTATIN CALCIUM 10 MG PO TABS: 10.0000 mg | ORAL_TABLET | Freq: Every day | ORAL | 1 refills | Status: DC

## 2021-08-19 MED ORDER — MIRABEGRON ER 50 MG PO TB24: 50.0000 mg | ORAL_TABLET | Freq: Every day | ORAL | 1 refills | Status: DC

## 2021-08-19 MED ORDER — VALSARTAN-HYDROCHLOROTHIAZIDE 160-25 MG PO TABS: 0.5000 | ORAL_TABLET | Freq: Every day | ORAL | 1 refills | Status: DC

## 2021-08-19 NOTE — Progress Notes (Signed)
Subjective:    Patient ID: Ariel [name], female    DOB: [birth year], 75 y.o.   MRN: [id number]  Chief Complaint: medical management of chronic issues     HPI:  1. Primary hypertension No c/o chest pain, sob or headache. Does not check blood pressure at home. BP Readings from Last 3 Encounters:  07/08/21 130/82  05/27/21 99/65  05/18/21 105/63     2. Pure hypercholesterolemia Does not watch diet very closely. Does no dedicated exercise. Lab Results  Component Value Date   CHOL 185 02/15/2021   HDL 53 02/15/2021   LDLCALC 112 (H) 02/15/2021   TRIG 114 02/15/2021   CHOLHDL 3.5 02/15/2021     3. Metabolic syndrome Does not check blood sugars at home. Lab Results  Component Value Date   HGBA1C 6.1 02/15/2021     4. Class 1 obesity due to excess calories with serious comorbidity and body mass index (BMI) of 33.0 to 33.9 in adult No recent weight changes Wt Readings from Last 3 Encounters:  08/19/21 185 lb (83.9 kg)  07/08/21 186 lb (84.4 kg)  05/27/21 184 lb (83.5 kg)   BMI Readings from Last 3 Encounters:  08/19/21 31.76 kg/m  07/08/21 31.93 kg/m  05/27/21 31.58 kg/m      Outpatient Encounter Medications as of 08/19/2021  Medication Sig   Alcohol Swabs (B-D SINGLE USE SWABS REGULAR) PADS Use daily to check BS Dx E88.81   Blood Glucose Calibration (TRUE METRIX LEVEL 1) Low SOLN Use with glucose machine Dx E88.81   Blood Glucose Monitoring Suppl (TRUE METRIX AIR GLUCOSE METER) w/Device KIT Test BS daily Dx E88.81   glucose blood (TRUE METRIX BLOOD GLUCOSE TEST) test strip Test BS daily Dx E88.81   ibuprofen (ADVIL) 800 MG tablet TAKE 1 TABLET BY MOUTH EVERY 8 HOURS AS NEEDED   MYRBETRIQ 50 MG TB24 tablet TAKE 1 TABLET BY MOUTH EVERY DAY   rosuvastatin (CRESTOR) 10 MG tablet Take 1 tablet (10 mg total) by mouth daily.   sulfamethoxazole-trimethoprim (BACTRIM DS) 800-160 MG tablet Take 1 tablet by mouth 2 (two) times daily.   TRUEplus Lancets 30G MISC  Test BS daily Dx E88.81   valsartan-hydrochlorothiazide (DIOVAN-HCT) 160-25 MG tablet Take 0.5 tablets by mouth daily.   No facility-administered encounter medications on file as of 08/19/2021.    Past Surgical History:  Procedure Laterality Date   ABDOMINAL HYSTERECTOMY  1985   CARPAL TUNNEL RELEASE Right 2007   CARPAL TUNNEL RELEASE Left 03/17/2016   Procedure: LEFT CARPAL TUNNEL RELEASE;  Surgeon: Carole Civil, MD;  Location: [facility];  Service: Orthopedics;  Laterality: Left;   CLOSED REDUCTION FINGER WITH PERCUTANEOUS PINNING Right 06/20/2019   Procedure: RIGHT SMALL FINGER CLOSED REDUCTION PIN FIXATION;  Surgeon: Leanora Cover, MD;  Location: [facility];  Service: Orthopedics;  Laterality: Right;    Family History  Problem Relation Age of Onset   Diabetes Mother    Cancer Father 22       colon   Alcohol abuse Brother    Cancer Brother        stomach   Lupus Brother     New complaints: None today  Social history: Lives with her husband  Controlled substance contract: n/a     Review of Systems  Constitutional:  Negative for diaphoresis.  Eyes:  Negative for pain.  Respiratory:  Negative for shortness of breath.   Cardiovascular:  Negative for chest pain, palpitations and leg swelling.  Gastrointestinal:  Negative for abdominal pain.  Endocrine: Negative for polydipsia.  Skin:  Negative for rash.  Neurological:  Negative for dizziness, weakness and headaches.  Hematological:  Does not bruise/bleed easily.  All other systems reviewed and are negative.     Objective:   Physical Exam Vitals and nursing note reviewed.  Constitutional:      General: She is not in acute distress.    Appearance: Normal appearance. She is well-developed.  HENT:     Head: Normocephalic.     Right Ear: Tympanic membrane normal.     Left Ear: Tympanic membrane normal.     Nose: Nose normal.     Mouth/Throat:     Mouth: Mucous membranes are moist.  Eyes:      Pupils: Pupils are equal, round, and reactive to light.  Neck:     Vascular: No carotid bruit or JVD.  Cardiovascular:     Rate and Rhythm: Normal rate and regular rhythm.     Heart sounds: Normal heart sounds.  Pulmonary:     Effort: Pulmonary effort is normal. No respiratory distress.     Breath sounds: Normal breath sounds. No wheezing or rales.  Chest:     Chest wall: No tenderness.  Abdominal:     General: Bowel sounds are normal. There is no distension or abdominal bruit.     Palpations: Abdomen is soft. There is no hepatomegaly, splenomegaly, mass or pulsatile mass.     Tenderness: There is no abdominal tenderness.  Musculoskeletal:        General: Normal range of motion.     Cervical back: Normal range of motion and neck supple.  Lymphadenopathy:     Cervical: No cervical adenopathy.  Skin:    General: Skin is warm and dry.  Neurological:     Mental Status: She is alert and oriented to person, place, and time.     Deep Tendon Reflexes: Reflexes are normal and symmetric.  Psychiatric:        Behavior: Behavior normal.        Thought Content: Thought content normal.        Judgment: Judgment normal.   BP 100/60   Pulse 76   Temp (!) 97.3 F (36.3 C) (Temporal)   Resp 20   Ht 5' 4"  (1.626 m)   Wt 185 lb (83.9 kg)   SpO2 99%   BMI 31.76 kg/m         Assessment & Plan:  Ariel [name] comes in today with chief complaint of Medical Management of Chronic Issues   Diagnosis and orders addressed:  1. Primary hypertension Low sodium diet - CBC with Differential/Platelet - CMP14+EGFR - valsartan-hydrochlorothiazide (DIOVAN-HCT) 160-25 MG tablet; Take 0.5 tablets by mouth daily.  Dispense: 90 tablet; Refill: 1  2. Pure hypercholesterolemia Low fat diet - Lipid panel - rosuvastatin (CRESTOR) 10 MG tablet; Take 1 tablet (10 mg total) by mouth daily.  Dispense: 90 tablet; Refill: 1  3. Metabolic syndrome Watch carbs in diet - Bayer DCA Hb A1c Waived -  Microalbumin / creatinine urine ratio  4. Class 1 obesity due to excess calories with serious comorbidity and body mass index (BMI) of 33.0 to 33.9 in adult Discussed diet and exercise for person with BMI >25 Will recheck weight in 3-6 months    Labs pending Health Maintenance reviewed Diet and exercise encouraged  Follow up plan: 6 months   Mary-Margaret Hassell Done, FNP

## 2021-08-19 NOTE — Addendum Note (Signed)
Addended by: Cleda Daub on: 08/19/2021 09:59 AM   Modules accepted: Orders

## 2021-08-19 NOTE — Patient Instructions (Signed)
Metabolic Syndrome Metabolic syndrome occurs when you have a combination of three or more factors that increase your chances of developing cardiovascular disease and diabetes. These factors include: High fasting blood sugar (glucose). High blood triglyceride level. High blood pressure. Low levels of high-density lipoprotein (HDL) blood cholesterol. Having a waist measurement that is: More than 40 inches in men. More than 35 inches in women. Metabolic syndrome is sometimes called insulin resistance syndrome or syndromeX. What are the causes? The exact cause of this condition is not known. It may be related to a combination of the factors that were passed down from your parents (genes) and the things that you do, eat, and drink (lifestyle choices). What increases the risk? You are more likely to develop this condition if you: Eat a diet that is high in calories and saturated fat. Do not exercise regularly. Are obese. Have a family history of type 2 diabetes mellitus. Have insulin resistance. Have a history of gestational diabetes during a previous pregnancy. Have conditions such as cardiovascular disease, nonalcoholic fatty liver disease, or polycystic ovary syndrome (PCOS). Are older. The risk increases with age. Use any tobacco products, including cigarettes, chewing tobacco, or e-cigarettes. What are the signs or symptoms? Metabolic syndrome has no specific symptoms. Having abnormal blood test resultsmay be the only sign of metabolic syndrome. How is this diagnosed? This condition may be diagnosed based on: Your blood pressure measurements. Your waist measurement. Blood tests. Your personal and family medical history. How is this treated? Treatment may include: Lifestyle changes to reduce your risk for heart disease, stroke, and diabetes. These include: Exercise. Weight loss. Eating a healthy diet. Stopping tobacco and nicotine use. Medicines that: Help your body maintain  normal blood glucose levels. Lower your blood pressure and your blood triglyceride levels. You may be referred to a behavioral counselor who will help you make lifestylechanges that will lower your risk for serious disease. Follow these instructions at home: Lifestyle     Exercise regularly, as told by your health care provider. Eat a healthy diet that includes fresh fruits and vegetables, whole grains, lean proteins, and low-fat or nonfat dairy products. Do not use any products that contain nicotine or tobacco, such as cigarettes, e-cigarettes, and chewing tobacco. If you need help quitting, ask your health care provider. Maintain a healthy weight. Work with your health care provider to lose weight safely, if needed. General instructions Take over-the-counter and prescription medicines only as told by your health care provider. If directed, measure your waist regularly and write down the measurements. To measure your waist: Stand up straight. Breathe out. Wrap a measuring tape around the part of your waist that is just above your hip bones. Read and write down the measurement. Keep all follow-up visits. This is important. Contact a health care provider if: You feel very tired. You are extremely thirsty. You urinate a lot more than usual. Your waist gets bigger. You have headaches that do not go away. Get help right away if: You suddenly develop any of the following: Dizziness. Blurry vision. Trouble speaking. Trouble swallowing. Weakness in an arm or leg. Chest pain. Trouble breathing. Your heartbeat feels abnormal. You faint. Summary Metabolic syndrome occurs when you have a combination of three or more factors that increase your chances of developing cardiovascular disease and diabetes. These factors include high fasting blood sugar (glucose), high blood triglyceride level, high blood pressure, low levels of high-density lipoprotein (HDL) blood cholesterol, and a waist  measurement that is more than 40   inches in men or more than 35 inches in women. Metabolic syndrome has no specific symptoms. Having abnormal blood test results may be the only signs of metabolic syndrome. Treatment may include lifestyle changes and medicines to reduce your risk for heart disease, stroke, and diabetes. This information is not intended to replace advice given to you by your health care provider. Make sure you discuss any questions you have with your healthcare provider. Document Revised: 02/10/2020 Document Reviewed: 02/10/2020 Elsevier Patient Education  2022 [employer]  

## 2021-08-19 NOTE — Addendum Note (Signed)
Addended by: Cleda Daub on: 08/19/2021 09:19 AM   Modules accepted: Orders

## 2021-08-20 LAB — CBC WITH DIFFERENTIAL/PLATELET
Basophils Absolute: 0.1 10*3/uL (ref 0.0–0.2)
Basos: 1 %
EOS (ABSOLUTE): 0.1 10*3/uL (ref 0.0–0.4)
Eos: 2 %
Hematocrit: 35.8 % (ref 34.0–46.6)
Hemoglobin: 12 g/dL (ref 11.1–15.9)
Immature Grans (Abs): 0 10*3/uL (ref 0.0–0.1)
Immature Granulocytes: 0 %
Lymphocytes Absolute: 2 10*3/uL (ref 0.7–3.1)
Lymphs: 36 %
MCH: 28.5 pg (ref 26.6–33.0)
MCHC: 33.5 g/dL (ref 31.5–35.7)
MCV: 85 fL (ref 79–97)
Monocytes Absolute: 0.8 10*3/uL (ref 0.1–0.9)
Monocytes: 14 %
Neutrophils Absolute: 2.6 10*3/uL (ref 1.4–7.0)
Neutrophils: 47 %
Platelets: 287 10*3/uL (ref 150–450)
RBC: 4.21 x10E6/uL (ref 3.77–5.28)
RDW: 13 % (ref 11.7–15.4)
WBC: 5.5 10*3/uL (ref 3.4–10.8)

## 2021-08-20 LAB — CMP14+EGFR
ALT: 16 IU/L (ref 0–32)
AST: 19 IU/L (ref 0–40)
Albumin/Globulin Ratio: 1.8 (ref 1.2–2.2)
Albumin: 4.5 g/dL (ref 3.7–4.7)
Alkaline Phosphatase: 77 IU/L (ref 44–121)
BUN/Creatinine Ratio: 19 (ref 12–28)
BUN: 13 mg/dL (ref 8–27)
Bilirubin Total: 0.2 mg/dL (ref 0.0–1.2)
CO2: 26 mmol/L (ref 20–29)
Calcium: 10 mg/dL (ref 8.7–10.3)
Chloride: 102 mmol/L (ref 96–106)
Creatinine, Ser: 0.67 mg/dL (ref 0.57–1.00)
Globulin, Total: 2.5 g/dL (ref 1.5–4.5)
Glucose: 96 mg/dL (ref 70–99)
Potassium: 4.3 mmol/L (ref 3.5–5.2)
Sodium: 140 mmol/L (ref 134–144)
Total Protein: 7 g/dL (ref 6.0–8.5)
eGFR: 92 mL/min/{1.73_m2} (ref >59)

## 2021-08-20 LAB — SARS-COV-2, NAA 2 DAY TAT

## 2021-08-20 LAB — LIPID PANEL
Chol/HDL Ratio: 3.3 ratio (ref 0.0–4.4)
Cholesterol, Total: 166 mg/dL (ref 100–199)
HDL: 50 mg/dL (ref >39)
LDL Chol Calc (NIH): 95 mg/dL (ref 0–99)
Triglycerides: 117 mg/dL (ref 0–149)
VLDL Cholesterol Cal: 21 mg/dL (ref 5–40)

## 2021-08-20 LAB — MICROALBUMIN / CREATININE URINE RATIO
Creatinine, Urine: 119.7 mg/dL
Microalb/Creat Ratio: 9 mg/g creat (ref 0–29)
Microalbumin, Urine: 11.1 ug/mL

## 2021-08-20 LAB — NOVEL CORONAVIRUS, NAA: SARS-CoV-2, NAA: NOT DETECTED

## 2021-08-21 ENCOUNTER — Other Ambulatory Visit: Payer: Self-pay | Admitting: Nurse Practitioner

## 2021-08-21 DIAGNOSIS — Z1231 Encounter for screening mammogram for malignant neoplasm of breast: Secondary | ICD-10-CM

## 2021-10-05 ENCOUNTER — Encounter: Payer: Self-pay | Admitting: Family Medicine

## 2021-10-05 ENCOUNTER — Telehealth: Payer: Self-pay | Admitting: Nurse Practitioner

## 2021-10-05 ENCOUNTER — Ambulatory Visit (INDEPENDENT_AMBULATORY_CARE_PROVIDER_SITE_OTHER): Payer: Commercial Managed Care - HMO | Admitting: Family Medicine

## 2021-10-05 VITALS — BP 127/80 | HR 82 | Temp 97.4°F | Ht 64.0 in | Wt 191.2 lb

## 2021-10-05 DIAGNOSIS — R0602 Shortness of breath: Secondary | ICD-10-CM | POA: Diagnosis not present

## 2021-10-05 DIAGNOSIS — J069 Acute upper respiratory infection, unspecified: Secondary | ICD-10-CM

## 2021-10-05 DIAGNOSIS — R051 Acute cough: Secondary | ICD-10-CM

## 2021-10-05 LAB — VERITOR FLU A/B WAIVED
Influenza A: NEGATIVE
Influenza B: NEGATIVE

## 2021-10-05 MED ORDER — ALBUTEROL SULFATE HFA 108 (90 BASE) MCG/ACT IN AERS: 2.0000 | INHALATION_SPRAY | Freq: Four times a day (QID) | RESPIRATORY_TRACT | 0 refills | Status: DC | PRN

## 2021-10-05 NOTE — Progress Notes (Signed)
Assessment & Plan:  1. Viral URI Education provided on URIs. Symptom management.  2. Acute cough - Veritor Flu A/B Waived - Novel Coronavirus, NAA ([facility])  3. Shortness of breath - albuterol (VENTOLIN HFA) 108 (90 Base) MCG/ACT inhaler; Inhale 2 puffs into the lungs every 6 (six) hours as needed for shortness of breath or wheezing.  Dispense: 18 g; Refill: 0   Follow up plan: Return if symptoms worsen or fail to improve.  Hendricks Limes, MSN, APRN, FNP-C [facility]  Subjective:   Patient ID: Ariel [name], female    DOB: [birth year], 76 y.o.   MRN: [id number]  HPI: Ariel [name] is a 76 y.o. female presenting on 10/05/2021 for Cough, Nasal Congestion, Headache, Shortness of Breath, and Fever (101.5- yesterday /X4 days )  Patient complains of cough, head congestion, headache, runny nose, fever, and shortness of breath. Max temp 101.5. Onset of symptoms was 4 days ago, gradually improving since that time. She is drinking plenty of fluids. Evaluation to date: none. Treatment to date: cough suppressants, decongestants, and OTC cold medication .  She does not smoke. Patient has been fully vaccinated against COVID-19.   ROS: Negative unless specifically indicated above in HPI.   Relevant past medical history reviewed and updated as indicated.   Allergies and medications reviewed and updated.   Current Outpatient Medications:    Alcohol Swabs (B-D SINGLE USE SWABS REGULAR) PADS, Use daily to check BS Dx E88.81, Disp: 100 each, Rfl: 3   Blood Glucose Calibration (TRUE METRIX LEVEL 1) Low SOLN, Use with glucose machine Dx E88.81, Disp: 3 each, Rfl: 0   Blood Glucose Monitoring Suppl (TRUE METRIX AIR GLUCOSE METER) w/Device KIT, Test BS daily Dx E88.81, Disp: 1 kit, Rfl: 0   glucose blood (TRUE METRIX BLOOD GLUCOSE TEST) test strip, Test BS daily Dx E88.81, Disp: 100 each, Rfl: 3   ibuprofen (ADVIL) 800 MG tablet, TAKE 1 TABLET BY MOUTH EVERY 8 HOURS AS  NEEDED, Disp: 270 tablet, Rfl: 0   mirabegron ER (MYRBETRIQ) 50 MG TB24 tablet, Take 1 tablet (50 mg total) by mouth daily., Disp: 90 tablet, Rfl: 1   rosuvastatin (CRESTOR) 10 MG tablet, Take 1 tablet (10 mg total) by mouth daily., Disp: 90 tablet, Rfl: 1   TRUEplus Lancets 30G MISC, Test BS daily Dx E88.81, Disp: 100 each, Rfl: 3   valsartan-hydrochlorothiazide (DIOVAN-HCT) 160-25 MG tablet, Take 0.5 tablets by mouth daily., Disp: 90 tablet, Rfl: 1  No Known Allergies  Objective:   BP 127/80    Pulse 82    Temp (!) 97.4 F (36.3 C) (Temporal)    Ht 5' 4"  (1.626 m)    Wt 191 lb 3.2 oz (86.7 kg)    SpO2 99%    BMI 32.82 kg/m    Physical Exam Vitals reviewed.  Constitutional:      General: She is not in acute distress.    Appearance: Normal appearance. She is not ill-appearing, toxic-appearing or diaphoretic.  HENT:     Head: Normocephalic and atraumatic.     Right Ear: Tympanic membrane, ear canal and external ear normal. There is no impacted cerumen.     Left Ear: Tympanic membrane, ear canal and external ear normal. There is no impacted cerumen.     Nose: Congestion present.     Right Turbinates: Enlarged and swollen.     Left Turbinates: Enlarged and swollen.     Right Sinus: No maxillary sinus tenderness or frontal sinus tenderness.  Left Sinus: No maxillary sinus tenderness or frontal sinus tenderness.     Mouth/Throat:     Mouth: Mucous membranes are moist.     Pharynx: Oropharynx is clear. No oropharyngeal exudate or posterior oropharyngeal erythema.  Eyes:     General: No scleral icterus.       Right eye: No discharge.        Left eye: No discharge.     Conjunctiva/sclera: Conjunctivae normal.  Cardiovascular:     Rate and Rhythm: Normal rate and regular rhythm.     Heart sounds: Normal heart sounds. No murmur heard.   No friction rub. No gallop.  Pulmonary:     Effort: Pulmonary effort is normal. No respiratory distress.     Breath sounds: Normal breath sounds.  No stridor. No wheezing, rhonchi or rales.  Musculoskeletal:        General: Normal range of motion.     Cervical back: Normal range of motion.  Lymphadenopathy:     Cervical: No cervical adenopathy.  Skin:    General: Skin is warm and dry.     Capillary Refill: Capillary refill takes less than 2 seconds.  Neurological:     General: No focal deficit present.     Mental Status: She is alert and oriented to person, place, and time. Mental status is at baseline.  Psychiatric:        Mood and Affect: Mood normal.        Behavior: Behavior normal.        Thought Content: Thought content normal.        Judgment: Judgment normal.

## 2021-10-05 NOTE — Telephone Encounter (Signed)
Please review and advise.

## 2021-10-05 NOTE — Telephone Encounter (Signed)
Patient aware and verbalized understanding. °

## 2021-10-06 LAB — NOVEL CORONAVIRUS, NAA: SARS-CoV-2, NAA: NOT DETECTED

## 2021-10-06 LAB — SARS-COV-2, NAA 2 DAY TAT

## 2021-10-07 ENCOUNTER — Telehealth: Payer: Self-pay | Admitting: Nurse Practitioner

## 2021-10-07 NOTE — Telephone Encounter (Signed)
Visit with you on 01/03. Please advise.

## 2021-10-07 NOTE — Telephone Encounter (Signed)
Overall how is she feeling - better, worse, or about the same? If any better - reassure her that this is viral and she is improving as expected. If unchanged or worse, I will send antibiotic. Let me know.

## 2021-10-07 NOTE — Telephone Encounter (Signed)
Patient states she is better- aware it Is viral and she is fine if improving.

## 2021-10-07 NOTE — Telephone Encounter (Signed)
°  Prescription Request  10/07/2021  Is this a "Controlled Substance" medicine? no  Have you seen your PCP in the last 2 weeks? yes  If YES, route message to pool  -  If NO, patient needs to be scheduled for appointment.  What is the name of the medication or equipment? Pt wants to know if dr can call in antibiotic for her cough and congestion  Have you contacted your pharmacy to request a refill? no   Which pharmacy would you like this sent to? [facility]   Patient notified that their request is being sent to the clinical staff for review and that they should receive a response within 2 business days.

## 2021-10-11 ENCOUNTER — Ambulatory Visit (INDEPENDENT_AMBULATORY_CARE_PROVIDER_SITE_OTHER): Payer: Commercial Managed Care - HMO | Admitting: Nurse Practitioner

## 2021-10-11 ENCOUNTER — Encounter: Payer: Self-pay | Admitting: Nurse Practitioner

## 2021-10-11 VITALS — BP 121/68 | HR 82 | Temp 97.8°F | Resp 20 | Ht 64.0 in | Wt 188.0 lb

## 2021-10-11 DIAGNOSIS — R051 Acute cough: Secondary | ICD-10-CM | POA: Diagnosis not present

## 2021-10-11 MED ORDER — GUAIFENESIN ER 600 MG PO TB12: 600.0000 mg | ORAL_TABLET | Freq: Two times a day (BID) | ORAL | 0 refills | Status: DC

## 2021-10-11 MED ORDER — BENZONATATE 100 MG PO CAPS: 100.0000 mg | ORAL_CAPSULE | Freq: Three times a day (TID) | ORAL | 0 refills | Status: DC | PRN

## 2021-10-11 NOTE — Patient Instructions (Signed)

## 2021-10-11 NOTE — Progress Notes (Signed)
Acute Office Visit  Subjective:    Patient ID: Ariel [name], female    DOB: 1946-08-14, 76 y.o.   MRN: [id number]  Chief Complaint  Patient presents with   Cough    Cough This is a recurrent problem. The current episode started in the past 7 days. The problem has been unchanged. The cough is Productive of sputum. Associated symptoms include nasal congestion. Pertinent negatives include no chest pain, chills, ear pain or headaches. She has tried a beta-agonist inhaler for the symptoms.    Past Medical History:  Diagnosis Date   Arthritis    Diverticulosis    Hyperlipidemia    Hypertension    Osteopenia     Past Surgical History:  Procedure Laterality Date   ABDOMINAL HYSTERECTOMY  1985   CARPAL TUNNEL RELEASE Right 2007   CARPAL TUNNEL RELEASE Left 03/17/2016   Procedure: LEFT CARPAL TUNNEL RELEASE;  Surgeon: Carole Civil, MD;  Location: [facility];  Service: Orthopedics;  Laterality: Left;   CLOSED REDUCTION FINGER WITH PERCUTANEOUS PINNING Right 06/20/2019   Procedure: RIGHT SMALL FINGER CLOSED REDUCTION PIN FIXATION;  Surgeon: Leanora Cover, MD;  Location: [facility];  Service: Orthopedics;  Laterality: Right;    Family History  Problem Relation Age of Onset   Diabetes Mother    Cancer Father 35       colon   Alcohol abuse Brother    Cancer Brother        stomach   Lupus Brother     Social History   Socioeconomic History   Marital status: Married    Spouse name: Not on file   Number of children: 1   Years of education: 12   Highest education level: 12th grade  Occupational History   Occupation: Retired    Comment: Retired from the [employer]   Smoking status: Never   Smokeless tobacco: Never  Vaping Use   Vaping Use: Never used  Substance and Sexual Activity   Alcohol use: Yes    Comment: social   Drug use: No   Sexual activity: Yes    Birth control/protection: Surgical  Other Topics Concern   Not on file  Social  History Narrative   Lives with new husband  - recently re-married 01/2021. One level living   Social Determinants of Health   Financial Resource Strain: Medium Risk   Difficulty of Paying Living Expenses: Somewhat hard  Food Insecurity: No Food Insecurity   Worried About Charity fundraiser in the Last Year: Never true   Ran Out of Food in the Last Year: Never true  Transportation Needs: No Transportation Needs   Lack of Transportation (Medical): No   Lack of Transportation (Non-Medical): No  Physical Activity: Insufficiently Active   Days of Exercise per Week: 3 days   Minutes of Exercise per Session: 30 min  Stress: No Stress Concern Present   Feeling of Stress : Not at all  Social Connections: Moderately Isolated   Frequency of Communication with Friends and Family: More than three times a week   Frequency of Social Gatherings with Friends and Family: More than three times a week   Attends Religious Services: More than 4 times per year   Active Member of [employer] or Organizations: No   Attends Archivist Meetings: Never   Marital Status: Widowed  Intimate Partner Violence: Not At Risk   Fear of Current or Ex-Partner: No   Emotionally Abused: No  Physically Abused: No   Sexually Abused: No    Outpatient Medications Prior to Visit  Medication Sig Dispense Refill   albuterol (VENTOLIN HFA) 108 (90 Base) MCG/ACT inhaler Inhale 2 puffs into the lungs every 6 (six) hours as needed for wheezing or shortness of breath. 8 g 0   Alcohol Swabs (B-D SINGLE USE SWABS REGULAR) PADS Use daily to check BS Dx E88.81 100 each 3   Blood Glucose Calibration (TRUE METRIX LEVEL 1) Low SOLN Use with glucose machine Dx E88.81 3 each 0   Blood Glucose Monitoring Suppl (TRUE METRIX AIR GLUCOSE METER) w/Device KIT Test BS daily Dx E88.81 1 kit 0   glucose blood (TRUE METRIX BLOOD GLUCOSE TEST) test strip Test BS daily Dx E88.81 100 each 3   ibuprofen (ADVIL) 800 MG tablet TAKE 1 TABLET BY MOUTH  EVERY 8 HOURS AS NEEDED 270 tablet 0   mirabegron ER (MYRBETRIQ) 50 MG TB24 tablet Take 1 tablet (50 mg total) by mouth daily. 90 tablet 1   rosuvastatin (CRESTOR) 10 MG tablet Take 1 tablet (10 mg total) by mouth daily. 90 tablet 1   TRUEplus Lancets 30G MISC Test BS daily Dx E88.81 100 each 3   valsartan-hydrochlorothiazide (DIOVAN-HCT) 160-25 MG tablet Take 0.5 tablets by mouth daily. 90 tablet 1   No facility-administered medications prior to visit.    No Known Allergies  Review of Systems  Constitutional:  Negative for chills.  HENT:  Negative for ear pain.   Respiratory:  Positive for cough.   Cardiovascular:  Negative for chest pain.  Neurological:  Negative for headaches.  All other systems reviewed and are negative.     Objective:    Physical Exam Vitals and nursing note reviewed.  Constitutional:      Appearance: Normal appearance.  HENT:     Head: Normocephalic.     Right Ear: Ear canal and external ear normal.     Left Ear: Ear canal and external ear normal.     Nose: Nose normal.  Eyes:     Conjunctiva/sclera: Conjunctivae normal.  Cardiovascular:     Rate and Rhythm: Normal rate and regular rhythm.     Pulses: Normal pulses.     Heart sounds: Normal heart sounds.  Pulmonary:     Effort: Pulmonary effort is normal.     Breath sounds: Normal breath sounds.  Abdominal:     General: Bowel sounds are normal.  Skin:    General: Skin is warm.     Findings: No rash.  Neurological:     Mental Status: She is alert and oriented to person, place, and time.    BP 121/68    Pulse 82    Temp 97.8 F (36.6 C) (Oral)    Resp 20    Ht $R'5\' 4"'ub$  (1.626 m)    Wt 188 lb (85.3 kg)    SpO2 100%    BMI 32.27 kg/m  Wt Readings from Last 3 Encounters:  10/11/21 188 lb (85.3 kg)  10/05/21 191 lb 3.2 oz (86.7 kg)  08/19/21 185 lb (83.9 kg)    Health Maintenance Due  Topic Date Due   COVID-19 Vaccine (5 - Booster for Moderna series) 08/17/2021   MAMMOGRAM  08/31/2021     There are no preventive care reminders to display for this patient.   Lab Results  Component Value Date   TSH 1.370 04/29/2014   Lab Results  Component Value Date   WBC 5.5 08/19/2021   HGB 12.0  08/19/2021   HCT 35.8 08/19/2021   MCV 85 08/19/2021   PLT 287 08/19/2021   Lab Results  Component Value Date   NA 140 08/19/2021   K 4.3 08/19/2021   CO2 26 08/19/2021   GLUCOSE 96 08/19/2021   BUN 13 08/19/2021   CREATININE 0.67 08/19/2021   BILITOT 0.2 08/19/2021   ALKPHOS 77 08/19/2021   AST 19 08/19/2021   ALT 16 08/19/2021   PROT 7.0 08/19/2021   ALBUMIN 4.5 08/19/2021   CALCIUM 10.0 08/19/2021   ANIONGAP 8 06/17/2019   EGFR 92 08/19/2021   Lab Results  Component Value Date   CHOL 166 08/19/2021   Lab Results  Component Value Date   HDL 50 08/19/2021   Lab Results  Component Value Date   LDLCALC 95 08/19/2021   Lab Results  Component Value Date   TRIG 117 08/19/2021   Lab Results  Component Value Date   CHOLHDL 3.3 08/19/2021   Lab Results  Component Value Date   HGBA1C 6.1 (H) 08/19/2021       Assessment & Plan:  Take meds as prescribed - Use a cool mist humidifier  -Use saline nose sprays frequently -Force fluids -For fever or aches or pains- take Tylenol or ibuprofen. -COVID-19 test negative -Benzonate 100 mg tablet by mouth for cough _Mucinex for cough and congestion Follow up with worsening unresolved symptoms   Problem List Items Addressed This Visit       Other   Acute cough - Primary   Relevant Medications   benzonatate (TESSALON PERLES) 100 MG capsule   guaiFENesin (MUCINEX) 600 MG 12 hr tablet     Meds ordered this encounter  Medications   benzonatate (TESSALON PERLES) 100 MG capsule    Sig: Take 1 capsule (100 mg total) by mouth 3 (three) times daily as needed for cough.    Dispense:  20 capsule    Refill:  0    Order Specific Question:   Supervising Provider    Answer:   Claretta Fraise [[id number]]   guaiFENesin  (MUCINEX) 600 MG 12 hr tablet    Sig: Take 1 tablet (600 mg total) by mouth 2 (two) times daily.    Dispense:  30 tablet    Refill:  0    Order Specific Question:   Supervising Provider    Answer:   Claretta Fraise [[id number]]     Ivy Lynn, NP

## 2021-10-13 ENCOUNTER — Ambulatory Visit
Admission: RE | Admit: 2021-10-13 | Discharge: 2021-10-13 | Disposition: A | Discharge status: Home / Self Care | Payer: Commercial Managed Care - HMO | Source: Ambulatory Visit | Attending: Nurse Practitioner | Admitting: Nurse Practitioner

## 2021-10-13 DIAGNOSIS — Z1231 Encounter for screening mammogram for malignant neoplasm of breast: Secondary | ICD-10-CM | POA: Diagnosis not present

## 2021-10-13 DIAGNOSIS — R69 Illness, unspecified: Secondary | ICD-10-CM | POA: Diagnosis not present

## 2021-11-05 DIAGNOSIS — H905 Unspecified sensorineural hearing loss: Secondary | ICD-10-CM | POA: Diagnosis not present

## 2021-11-23 ENCOUNTER — Other Ambulatory Visit: Payer: Self-pay | Admitting: Nurse Practitioner

## 2021-12-20 ENCOUNTER — Ambulatory Visit: Payer: Commercial Managed Care - HMO | Admitting: Orthopedic Surgery

## 2021-12-23 ENCOUNTER — Other Ambulatory Visit: Payer: Self-pay

## 2021-12-23 ENCOUNTER — Ambulatory Visit (INDEPENDENT_AMBULATORY_CARE_PROVIDER_SITE_OTHER): Payer: Medicare Other | Admitting: Orthopedic Surgery

## 2021-12-23 DIAGNOSIS — G8929 Other chronic pain: Secondary | ICD-10-CM | POA: Diagnosis not present

## 2021-12-23 DIAGNOSIS — M25512 Pain in left shoulder: Secondary | ICD-10-CM

## 2021-12-23 NOTE — Progress Notes (Signed)
[name] complains of chronic pain left shoulder request left shoulder injection she is on Ultracet she says it does not help ? ?Procedure note the subacromial injection shoulder left  ? ?Verbal consent was obtained to inject the  Left   Shoulder ? ?Timeout was completed to confirm the injection site is a subacromial space of the  left  shoulder ? ?Medication used Depo-Medrol 40 mg and lidocaine 1% 3 cc ? ?Anesthesia was provided by ethyl chloride ? ?The injection was performed in the left  posterior subacromial space. After pinning the skin with alcohol and anesthetized the skin with ethyl chloride the subacromial space was injected using a 20-gauge needle. There were no complications ? ?Sterile dressing was applied. ? ? ?I referred her to a pain management clinic for consideration of opioid therapy ?

## 2022-01-14 ENCOUNTER — Ambulatory Visit: Payer: Medicare Other | Admitting: Orthopedic Surgery

## 2022-02-07 ENCOUNTER — Ambulatory Visit (INDEPENDENT_AMBULATORY_CARE_PROVIDER_SITE_OTHER): Payer: Medicare Other | Admitting: Orthopedic Surgery

## 2022-02-07 ENCOUNTER — Telehealth: Payer: Self-pay | Admitting: Orthopedic Surgery

## 2022-02-07 ENCOUNTER — Ambulatory Visit (INDEPENDENT_AMBULATORY_CARE_PROVIDER_SITE_OTHER): Payer: Medicare Other

## 2022-02-07 VITALS — BP 121/69 | HR 77 | Ht 64.0 in | Wt 186.0 lb

## 2022-02-07 DIAGNOSIS — M25511 Pain in right shoulder: Secondary | ICD-10-CM

## 2022-02-07 DIAGNOSIS — M7541 Impingement syndrome of right shoulder: Secondary | ICD-10-CM | POA: Diagnosis not present

## 2022-02-07 NOTE — Progress Notes (Signed)
Chief Complaint  ?Patient presents with  ? Shoulder Pain  ?  Right, fell at work in January hurts to raise it and go behind   ? ? ?HPI: 76 yo female acute shoulder pain after fall in January  ? ?She complains of pain with forward elevation and decreased range of motion with forward elevation and internal rotation ? ?Past Medical History:  ?Diagnosis Date  ? Arthritis   ? Diverticulosis   ? Hyperlipidemia   ? Hypertension   ? Osteopenia   ? ? ?BP 121/69   Pulse 77   Ht 5\' 4"  (1.626 m)   Wt 186 lb (84.4 kg)   BMI 31.93 kg/m?  ? ? ?General appearance: Well-developed well-nourished no gross deformities ? ?Cardiovascular normal pulse and perfusion normal color without edema ? ?Neurologically no sensation loss or deficits or pathologic reflexes ? ?Psychological: Awake alert and oriented x3 mood and affect normal ? ?Skin no lacerations or ulcerations no nodularity no palpable masses, no erythema or nodularity ? ?Musculoskeletal: Rotator cuff strength is normal in abduction and flexion though painful she has painful range of motion from 90 degrees to 120 degrees she shows decreased internal rotation ? ?Imaging right shoulder x-ray ? ?A/P ? ?Encounter Diagnoses  ?Name Primary?  ? Acute pain of right shoulder Yes  ? Impingement syndrome of right shoulder   ? ? ?Recommend subacromial injection ?Tylenol ?NSAID ?Physical therapy ?Follow-up as needed ? ? ?Procedure note the subacromial injection shoulder RIGHT  ? ? ?Verbal consent was obtained to inject the  RIGHT   Shoulder ? ?Timeout was completed to confirm the injection site is a subacromial space of the  RIGHT  shoulder ? ? ?Medication used Depo-Medrol 40 mg and lidocaine 1% 3 cc ? ?Anesthesia was provided by ethyl chloride ? ?The injection was performed in the RIGHT  posterior subacromial space. After pinning the skin with alcohol and anesthetized the skin with ethyl chloride the subacromial space was injected using a 20-gauge needle. There were no  complications ? ?Sterile dressing was applied. ?  ?

## 2022-02-07 NOTE — Addendum Note (Signed)
Addended by: Recardo Evangelist A on: 02/07/2022 04:57 PM ? ? Modules accepted: Orders ? ?

## 2022-02-07 NOTE — Patient Instructions (Signed)
Take ibuprofen and tylenol for pain  ?You can also apply  ? ? ?PLEASE READ THE PACKAGE INSTRUCTIONS BEFORE USING  ? ?Ben Gay arthritis cream ? ?Icy hot vanishing gel ? ?Aspercreme odor free ? ?Myoflex Oderless pain reliever ? ?Capzasin ? ?Sportscreme ? ?Max freeze ?

## 2022-02-07 NOTE — Telephone Encounter (Signed)
Patient called stating she needs the referral put in for her PT.  She called them and they have already made her an appt for 02/15/22 and they told her they need the referral for her PT  ?

## 2022-02-08 NOTE — Telephone Encounter (Signed)
REFERRAL ENTERED. PT TO HAVE PHYSICAL THERAPY IN [place] PER HER REQUEST ?

## 2022-02-11 ENCOUNTER — Ambulatory Visit (INDEPENDENT_AMBULATORY_CARE_PROVIDER_SITE_OTHER): Payer: Medicare Other

## 2022-02-11 VITALS — Wt 186.0 lb

## 2022-02-11 DIAGNOSIS — Z0000 Encounter for general adult medical examination without abnormal findings: Secondary | ICD-10-CM | POA: Diagnosis not present

## 2022-02-11 NOTE — Patient Instructions (Signed)
Ariel [name] , ?Thank you for taking time to come for your Medicare Wellness Visit. I appreciate your ongoing commitment to your health goals. Please review the following plan we discussed and let me know if I can assist you in the future.  ? ?Screening recommendations/referrals: ?Colonoscopy: Done 05/29/2012 - Repeat in 10 years *this year ?Mammogram: Done 10/13/2021 - Repeat annually ?Bone Density: one 02/16/2021 - Repeat every 2 years ?Recommended yearly ophthalmology/optometry visit for glaucoma screening and checkup ?Recommended yearly dental visit for hygiene and checkup ? ?Vaccinations: ?Influenza vaccine: Done 06/22/2021 - Repeat annually ?Pneumococcal vaccine: Done 12/08/2014 & 05/18/2017 ?Tdap vaccine: Done 10/13/2016 - Repeat in 10 years ?Shingles vaccine: Zostavax done 2016 - Shingrix is 2 doses 2-6 months apart and over 90% effective *due now  ?Covid-19: Done 12/03/2019, 12/31/2019, 08/04/2020, & 06/22/2021 ? ?Advanced directives: Advance directive discussed with you today. Even though you declined this today, please call our office should you change your mind, and we can give you the proper paperwork for you to fill out.  ? ?Conditions/risks identified: Aim for 30 minutes of exercise or brisk walking, 6-8 glasses of water, and 5 servings of fruits and vegetables each day.  ? ?Next appointment: Follow up in one year for your annual wellness visit  ? ? ?Preventive Care 23 Years and Older, Female ?Preventive care refers to lifestyle choices and visits with your health care provider that can promote health and wellness. ?What does preventive care include? ?A yearly physical exam. This is also called an annual well check. ?Dental exams once or twice a year. ?Routine eye exams. Ask your health care provider how often you should have your eyes checked. ?Personal lifestyle choices, including: ?Daily care of your teeth and gums. ?Regular physical activity. ?Eating a healthy diet. ?Avoiding tobacco and drug use. ?Limiting  alcohol use. ?Practicing safe sex. ?Taking low-dose aspirin every day. ?Taking vitamin and mineral supplements as recommended by your health care provider. ?What happens during an annual well check? ?The services and screenings done by your health care provider during your annual well check will depend on your age, overall health, lifestyle risk factors, and family history of disease. ?Counseling  ?Your health care provider may ask you questions about your: ?Alcohol use. ?Tobacco use. ?Drug use. ?Emotional well-being. ?Home and relationship well-being. ?Sexual activity. ?Eating habits. ?History of falls. ?Memory and ability to understand (cognition). ?Work and work Astronomer. ?Reproductive health. ?Screening  ?You may have the following tests or measurements: ?Height, weight, and BMI. ?Blood pressure. ?Lipid and cholesterol levels. These may be checked every 5 years, or more frequently if you are over 43 years old. ?Skin check. ?Lung cancer screening. You may have this screening every year starting at age 49 if you have a 30-pack-year history of smoking and currently smoke or have quit within the past 15 years. ?Fecal occult blood test (FOBT) of the stool. You may have this test every year starting at age 110. ?Flexible sigmoidoscopy or colonoscopy. You may have a sigmoidoscopy every 5 years or a colonoscopy every 10 years starting at age 48. ?Hepatitis C blood test. ?Hepatitis B blood test. ?Sexually transmitted disease (STD) testing. ?Diabetes screening. This is done by checking your blood sugar (glucose) after you have not eaten for a while (fasting). You may have this done every 1-3 years. ?Bone density scan. This is done to screen for osteoporosis. You may have this done starting at age 83. ?Mammogram. This may be done every 1-2 years. Talk to your health care  provider about how often you should have regular mammograms. ?Talk with your health care provider about your test results, treatment options, and if  necessary, the need for more tests. ?Vaccines  ?Your health care provider may recommend certain vaccines, such as: ?Influenza vaccine. This is recommended every year. ?Tetanus, diphtheria, and acellular pertussis (Tdap, Td) vaccine. You may need a Td booster every 10 years. ?Zoster vaccine. You may need this after age 22. ?Pneumococcal 13-valent conjugate (PCV13) vaccine. One dose is recommended after age 45. ?Pneumococcal polysaccharide (PPSV23) vaccine. One dose is recommended after age 31. ?Talk to your health care provider about which screenings and vaccines you need and how often you need them. ?This information is not intended to replace advice given to you by your health care provider. Make sure you discuss any questions you have with your health care provider. ?Document Released: 10/16/2015 Document Revised: 06/08/2016 Document Reviewed: 07/21/2015 ?Elsevier Interactive Patient Education ? 2017 [employer] ? ?Fall Prevention in the Home ?Falls can cause injuries. They can happen to people of all ages. There are many things you can do to make your home safe and to help prevent falls. ?What can I do on the outside of my home? ?Regularly fix the edges of walkways and driveways and fix any cracks. ?Remove anything that might make you trip as you walk through a door, such as a raised step or threshold. ?Trim any bushes or trees on the path to your home. ?Use bright outdoor lighting. ?Clear any walking paths of anything that might make someone trip, such as rocks or tools. ?Regularly check to see if handrails are loose or broken. Make sure that both sides of any steps have handrails. ?Any raised decks and porches should have guardrails on the edges. ?Have any leaves, snow, or ice cleared regularly. ?Use sand or salt on walking paths during winter. ?Clean up any spills in your garage right away. This includes oil or grease spills. ?What can I do in the bathroom? ?Use night lights. ?Install grab bars by the toilet  and in the tub and shower. Do not use towel bars as grab bars. ?Use non-skid mats or decals in the tub or shower. ?If you need to sit down in the shower, use a plastic, non-slip stool. ?Keep the floor dry. Clean up any water that spills on the floor as soon as it happens. ?Remove soap buildup in the tub or shower regularly. ?Attach bath mats securely with double-sided non-slip rug tape. ?Do not have throw rugs and other things on the floor that can make you trip. ?What can I do in the bedroom? ?Use night lights. ?Make sure that you have a light by your bed that is easy to reach. ?Do not use any sheets or blankets that are too big for your bed. They should not hang down onto the floor. ?Have a firm chair that has side arms. You can use this for support while you get dressed. ?Do not have throw rugs and other things on the floor that can make you trip. ?What can I do in the kitchen? ?Clean up any spills right away. ?Avoid walking on wet floors. ?Keep items that you use a lot in easy-to-reach places. ?If you need to reach something above you, use a strong step stool that has a grab bar. ?Keep electrical cords out of the way. ?Do not use floor polish or wax that makes floors slippery. If you must use wax, use non-skid floor wax. ?Do not have throw rugs  and other things on the floor that can make you trip. ?What can I do with my stairs? ?Do not leave any items on the stairs. ?Make sure that there are handrails on both sides of the stairs and use them. Fix handrails that are broken or loose. Make sure that handrails are as long as the stairways. ?Check any carpeting to make sure that it is firmly attached to the stairs. Fix any carpet that is loose or worn. ?Avoid having throw rugs at the top or bottom of the stairs. If you do have throw rugs, attach them to the floor with carpet tape. ?Make sure that you have a light switch at the top of the stairs and the bottom of the stairs. If you do not have them, ask someone to add  them for you. ?What else can I do to help prevent falls? ?Wear shoes that: ?Do not have high heels. ?Have rubber bottoms. ?Are comfortable and fit you well. ?Are closed at the toe. Do not wear sandals

## 2022-02-11 NOTE — Progress Notes (Signed)
? ?Subjective:  ? Valia Zianne Schubring is a 76 y.o. female who presents for Medicare Annual (Subsequent) preventive examination. ? ?Virtual Visit via Telephone Note ? ?I connected with  Genifer Verl Dicker on 02/11/22 at 10:00 AM EDT by telephone and verified that I am speaking with the correct person using two identifiers. ? ?Location: ?Patient: Home ?Provider: [facility] ?Persons participating in the virtual visit: patient/Nurse Health Advisor ?  ?I discussed the limitations, risks, security and privacy concerns of performing an evaluation and management service by telephone and the availability of in person appointments. The patient expressed understanding and agreed to proceed. ? ?Interactive audio and video telecommunications were attempted between this nurse and patient, however failed, due to patient having technical difficulties OR patient did not have access to video capability.  We continued and completed visit with audio only. ? ?Some vital signs may be absent or patient reported.  ? ?Omni Dunsworth Dionne Ano, LPN  ? ?Review of Systems    ? ?Cardiac Risk Factors include: advanced age (>2mn, >>59women);obesity (BMI >30kg/m2);dyslipidemia;hypertension;sedentary lifestyle ? ?   ?Objective:  ?  ?Today's Vitals  ? 02/11/22 0959  ?Weight: 186 lb (84.4 kg)  ? ?Body mass index is 31.93 kg/m?. ? ? ?  02/11/2022  ? 10:07 AM 02/10/2021  ? 12:36 PM 06/20/2019  ?  9:27 AM 06/14/2019  ? 11:13 AM 05/22/2019  ?  3:34 PM 05/11/2018  ? 10:34 AM 10/13/2016  ?  2:32 PM  ?Advanced Directives  ?Does Patient Have a Medical Advance Directive? No No Yes No No No No  ?Type of AScientist, physiologicalof AKelleyLiving will      ?Does patient want to make changes to medical advance directive?   No - Patient declined      ?Copy of HCashtonin Chart?   No - copy requested      ?Would patient like information on creating a medical advance directive? No - Patient declined No - Patient declined  No - Patient declined Yes  (MAU/Ambulatory/Procedural Areas - Information given) No - Patient declined No - Patient declined  ? ? ?Current Medications (verified) ?Outpatient Encounter Medications as of 02/11/2022  ?Medication Sig  ? ibuprofen (ADVIL) 800 MG tablet TAKE 1 TABLET BY MOUTH EVERY 8 HOURS AS NEEDED  ? mirabegron ER (MYRBETRIQ) 50 MG TB24 tablet Take 1 tablet (50 mg total) by mouth daily.  ? rosuvastatin (CRESTOR) 10 MG tablet Take 1 tablet (10 mg total) by mouth daily.  ? valsartan-hydrochlorothiazide (DIOVAN-HCT) 160-25 MG tablet Take 0.5 tablets by mouth daily.  ? [DISCONTINUED] albuterol (VENTOLIN HFA) 108 (90 Base) MCG/ACT inhaler TAKE 2 PUFFS BY MOUTH EVERY 6 HOURS AS NEEDED FOR WHEEZE OR SHORTNESS OF BREATH (Patient not taking: Reported on 02/07/2022)  ? [DISCONTINUED] Alcohol Swabs (B-D SINGLE USE SWABS REGULAR) PADS Use daily to check BS Dx E88.81  ? [DISCONTINUED] benzonatate (TESSALON PERLES) 100 MG capsule Take 1 capsule (100 mg total) by mouth 3 (three) times daily as needed for cough. (Patient not taking: Reported on 02/07/2022)  ? [DISCONTINUED] Blood Glucose Calibration (TRUE METRIX LEVEL 1) Low SOLN Use with glucose machine Dx E88.81  ? [DISCONTINUED] Blood Glucose Monitoring Suppl (TRUE METRIX AIR GLUCOSE METER) w/Device KIT Test BS daily Dx EY60.63 ? [DISCONTINUED] glucose blood (TRUE METRIX BLOOD GLUCOSE TEST) test strip Test BS daily Dx EK16.01 ? [DISCONTINUED] guaiFENesin (MUCINEX) 600 MG 12 hr tablet Take 1 tablet (600 mg total) by mouth 2 (two) times  daily. (Patient not taking: Reported on 02/07/2022)  ? [DISCONTINUED] TRUEplus Lancets 30G MISC Test BS daily Dx E33.29  ? ?No facility-administered encounter medications on file as of 02/11/2022.  ? ? ?Allergies (verified) ?Patient has no known allergies.  ? ?History: ?Past Medical History:  ?Diagnosis Date  ? Arthritis   ? Diverticulosis   ? Hyperlipidemia   ? Hypertension   ? Osteopenia   ? ?Past Surgical History:  ?Procedure Laterality Date  ? ABDOMINAL HYSTERECTOMY   1985  ? CARPAL TUNNEL RELEASE Right 2007  ? CARPAL TUNNEL RELEASE Left 03/17/2016  ? Procedure: LEFT CARPAL TUNNEL RELEASE;  Surgeon: Carole Civil, MD;  Location: [facility];  Service: Orthopedics;  Laterality: Left;  ? CLOSED REDUCTION FINGER WITH PERCUTANEOUS PINNING Right 06/20/2019  ? Procedure: RIGHT SMALL FINGER CLOSED REDUCTION PIN FIXATION;  Surgeon: Leanora Cover, MD;  Location: [facility];  Service: Orthopedics;  Laterality: Right;  ? ?Family History  ?Problem Relation Age of Onset  ? Diabetes Mother   ? Cancer Father 44  ?     colon  ? Alcohol abuse Brother   ? Cancer Brother   ?     stomach  ? Lupus Brother   ? ?Social History  ? ?Socioeconomic History  ? Marital status: Married  ?  Spouse name: Venora Maples  ? Number of children: 1  ? Years of education: 81  ? Highest education level: 12th grade  ?Occupational History  ? Occupation: Retired  ?  Comment: Retired from the [employer]  ?Tobacco Use  ? Smoking status: Never  ? Smokeless tobacco: Never  ?Vaping Use  ? Vaping Use: Never used  ?Substance and Sexual Activity  ? Alcohol use: Yes  ?  Comment: social  ? Drug use: No  ? Sexual activity: Yes  ?  Birth control/protection: Surgical  ?Other Topics Concern  ? Not on file  ?Social History Narrative  ? Lives with new husband  - recently re-married 01/2021. One level living  ? ?Social Determinants of Health  ? ?Financial Resource Strain: Medium Risk  ? Difficulty of Paying Living Expenses: Somewhat hard  ?Food Insecurity: Food Insecurity Present  ? Worried About Charity fundraiser in the Last Year: Sometimes true  ? Ran Out of Food in the Last Year: Never true  ?Transportation Needs: No Transportation Needs  ? Lack of Transportation (Medical): No  ? Lack of Transportation (Non-Medical): No  ?Physical Activity: Unknown  ? Days of Exercise per Week: Not on file  ? Minutes of Exercise per Session: 30 min  ?Stress: No Stress Concern Present  ? Feeling of Stress : Not at all  ?Social Connections: Socially  Integrated  ? Frequency of Communication with Friends and Family: More than three times a week  ? Frequency of Social Gatherings with Friends and Family: More than three times a week  ? Attends Religious Services: More than 4 times per year  ? Active Member of Clubs or Organizations: Yes  ? Attends Archivist Meetings: More than 4 times per year  ? Marital Status: Married  ? ? ?Tobacco Counseling ?Counseling given: Not Answered ? ? ?Clinical Intake: ? ?Pre-visit preparation completed: Yes ? ?Pain : No/denies pain ? ?  ? ?BMI - recorded: 31.93 ?Nutritional Status: BMI > 30  Obese ?Nutritional Risks: None ?Diabetes: No ? ?How often do you need to have someone help you when you read instructions, pamphlets, or other written materials from your doctor or pharmacy?: 1 - Never ? ?  Diabetic? no ? ?Interpreter Needed?: No ? ?Information entered by :: Almendra Loria, LPN ? ? ?Activities of Daily Living ? ?  02/11/2022  ? 10:07 AM  ?In your present state of health, do you have any difficulty performing the following activities:  ?Hearing? 1  ?Comment wears hearing aids  ?Vision? 0  ?Difficulty concentrating or making decisions? 0  ?Walking or climbing stairs? 0  ?Dressing or bathing? 0  ?Doing errands, shopping? 0  ?Preparing Food and eating ? N  ?Using the Toilet? N  ?In the past six months, have you accidently leaked urine? N  ?Do you have problems with loss of bowel control? N  ?Managing your Medications? N  ?Managing your Finances? N  ?Housekeeping or managing your Housekeeping? N  ? ? ?Patient Care Team: ?Chevis Pretty, FNP as PCP - General (Nurse Practitioner) ?Carole Civil, MD as Consulting Physician (Orthopedic Surgery) ? ?Indicate any recent Medical Services you may have received from other than [facility] providers in the past year (date may be approximate). ? ?   ?Assessment:  ? This is a routine wellness examination for [place]. ? ?Hearing/Vision screen ?Hearing Screening - Comments:: Wears hearing  aids - from [facility] in [place] - has f/u every year ?Vision Screening - Comments:: Wears rx glasses prn only - up to date with routine eye exams with [facility] ? ?Dietary issues and exercise act

## 2022-02-15 ENCOUNTER — Ambulatory Visit: Payer: Medicare Other | Admitting: Physical Therapy

## 2022-02-16 ENCOUNTER — Ambulatory Visit: Payer: Medicare Other | Admitting: Nurse Practitioner

## 2022-02-16 NOTE — Progress Notes (Deleted)
   Subjective:    Patient ID: Ariel [name], female    DOB: [birth year], 76 y.o.   MRN: [id number]   Chief Complaint: medical management of chronic issues     HPI:  Ariel [name] is a 76 y.o. who identifies as a female who was assigned female at birth.   Social history: Lives with:  Work history: ***   Comes in today for follow up of the following chronic medical issues:  1. Primary hypertension No c/o chest pain, so b or headache. Does not check blood pressure at home. BP Readings from Last 3 Encounters:  02/07/22 121/69  10/11/21 121/68  10/05/21 127/80     2. Pure hypercholesterolemia Does try to watch diet but does no dedicated exercise. Lab Results  Component Value Date   CHOL 166 08/19/2021   HDL 50 08/19/2021   LDLCALC 95 08/19/2021   TRIG 117 08/19/2021   CHOLHDL 3.3 08/19/2021     3. Metabolic syndrome ***  4. Overactive bladder Is on myrbetriq and is doing well.  5. Class 1 obesity due to excess calories with serious comorbidity and body mass index (BMI) of 33.0 to 33.9 in adult No rececnt weight changes   New complaints: ***  No Known Allergies Outpatient Encounter Medications as of 02/16/2022  Medication Sig   ibuprofen (ADVIL) 800 MG tablet TAKE 1 TABLET BY MOUTH EVERY 8 HOURS AS NEEDED   mirabegron ER (MYRBETRIQ) 50 MG TB24 tablet Take 1 tablet (50 mg total) by mouth daily.   rosuvastatin (CRESTOR) 10 MG tablet Take 1 tablet (10 mg total) by mouth daily.   valsartan-hydrochlorothiazide (DIOVAN-HCT) 160-25 MG tablet Take 0.5 tablets by mouth daily.   No facility-administered encounter medications on file as of 02/16/2022.    Past Surgical History:  Procedure Laterality Date   ABDOMINAL HYSTERECTOMY  1985   CARPAL TUNNEL RELEASE Right 2007   CARPAL TUNNEL RELEASE Left 03/17/2016   Procedure: LEFT CARPAL TUNNEL RELEASE;  Surgeon: Vickki Hearing, MD;  Location: [facility];  Service: Orthopedics;  Laterality: Left;   CLOSED  REDUCTION FINGER WITH PERCUTANEOUS PINNING Right 06/20/2019   Procedure: RIGHT SMALL FINGER CLOSED REDUCTION PIN FIXATION;  Surgeon: Betha Loa, MD;  Location: [facility];  Service: Orthopedics;  Laterality: Right;    Family History  Problem Relation Age of Onset   Diabetes Mother    Cancer Father 93       colon   Alcohol abuse Brother    Cancer Brother        stomach   Lupus Brother       Controlled substance contract: ***     Review of Systems     Objective:   Physical Exam        Assessment & Plan:

## 2022-02-21 ENCOUNTER — Ambulatory Visit: Payer: Medicare Other

## 2022-03-10 ENCOUNTER — Ambulatory Visit (INDEPENDENT_AMBULATORY_CARE_PROVIDER_SITE_OTHER): Payer: Medicare Other | Admitting: Nurse Practitioner

## 2022-03-10 ENCOUNTER — Ambulatory Visit: Payer: Medicare Other | Admitting: Nurse Practitioner

## 2022-03-10 ENCOUNTER — Encounter: Payer: Self-pay | Admitting: Nurse Practitioner

## 2022-03-10 VITALS — BP 109/71 | HR 86 | Temp 98.1°F | Resp 20 | Ht 64.0 in | Wt 186.0 lb

## 2022-03-10 DIAGNOSIS — E8881 Metabolic syndrome: Secondary | ICD-10-CM

## 2022-03-10 DIAGNOSIS — Z6833 Body mass index (BMI) 33.0-33.9, adult: Secondary | ICD-10-CM

## 2022-03-10 DIAGNOSIS — R739 Hyperglycemia, unspecified: Secondary | ICD-10-CM | POA: Diagnosis not present

## 2022-03-10 DIAGNOSIS — I10 Essential (primary) hypertension: Secondary | ICD-10-CM

## 2022-03-10 DIAGNOSIS — N3281 Overactive bladder: Secondary | ICD-10-CM

## 2022-03-10 DIAGNOSIS — E7800 Pure hypercholesterolemia, unspecified: Secondary | ICD-10-CM

## 2022-03-10 DIAGNOSIS — E6609 Other obesity due to excess calories: Secondary | ICD-10-CM

## 2022-03-10 LAB — BAYER DCA HB A1C WAIVED: HB A1C (BAYER DCA - WAIVED): 6.2 % — ABNORMAL HIGH (ref 4.8–5.6)

## 2022-03-10 MED ORDER — ROSUVASTATIN CALCIUM 10 MG PO TABS: 10.0000 mg | ORAL_TABLET | Freq: Every day | ORAL | 1 refills | Status: DC

## 2022-03-10 MED ORDER — MIRABEGRON ER 50 MG PO TB24: 50.0000 mg | ORAL_TABLET | Freq: Every day | ORAL | 1 refills | Status: DC

## 2022-03-10 MED ORDER — VALSARTAN-HYDROCHLOROTHIAZIDE 160-25 MG PO TABS: 0.5000 | ORAL_TABLET | Freq: Every day | ORAL | 1 refills | Status: DC

## 2022-03-10 NOTE — Progress Notes (Signed)
Subjective:    Patient ID: Ariel [name], female    DOB: 09/07/1946, 76 y.o.   MRN: [id number]   Chief Complaint: Medical Management of Chronic Issues    HPI:  Ariel [name] is a 76 y.o. who identifies as a female who was assigned female at birth.   Social history: Lives with: husband Work history: retired   Scientist, forensic in today for follow up of the following chronic medical issues:  1. Primary hypertension No c/o chest pain, sob or headache. Does not check blood pressure at home. BP Readings from Last 3 Encounters:  03/10/22 109/71  02/07/22 121/69  10/11/21 121/68     2. Pure hypercholesterolemia Does try to watch diet. Does no dedicated exercise. Lab Results  Component Value Date   CHOL 166 08/19/2021   HDL 50 08/19/2021   LDLCALC 95 08/19/2021   TRIG 117 08/19/2021   CHOLHDL 3.3 08/19/2021     3. Metabolic syndrome Does not check blood sugars at home. Lab Results  Component Value Date   HGBA1C 6.1 (H) 08/19/2021     4. Overactive bladder Is on myrbetriq and that helsp alot  5. Class 1 obesity due to excess calories with serious comorbidity and body mass index (BMI) of 33.0 to 33.9 in adult No recent weight changes Wt Readings from Last 3 Encounters:  03/10/22 186 lb (84.4 kg)  02/11/22 186 lb (84.4 kg)  02/07/22 186 lb (84.4 kg)   BMI Readings from Last 3 Encounters:  03/10/22 31.93 kg/m  02/11/22 31.93 kg/m  02/07/22 31.93 kg/m      New complaints: None today  No Known Allergies Outpatient Encounter Medications as of 03/10/2022  Medication Sig   ibuprofen (ADVIL) 800 MG tablet TAKE 1 TABLET BY MOUTH EVERY 8 HOURS AS NEEDED   mirabegron ER (MYRBETRIQ) 50 MG TB24 tablet Take 1 tablet (50 mg total) by mouth daily.   rosuvastatin (CRESTOR) 10 MG tablet Take 1 tablet (10 mg total) by mouth daily.   valsartan-hydrochlorothiazide (DIOVAN-HCT) 160-25 MG tablet Take 0.5 tablets by mouth daily.   No facility-administered encounter  medications on file as of 03/10/2022.    Past Surgical History:  Procedure Laterality Date   ABDOMINAL HYSTERECTOMY  1985   CARPAL TUNNEL RELEASE Right 2007   CARPAL TUNNEL RELEASE Left 03/17/2016   Procedure: LEFT CARPAL TUNNEL RELEASE;  Surgeon: Carole Civil, MD;  Location: [facility];  Service: Orthopedics;  Laterality: Left;   CLOSED REDUCTION FINGER WITH PERCUTANEOUS PINNING Right 06/20/2019   Procedure: RIGHT SMALL FINGER CLOSED REDUCTION PIN FIXATION;  Surgeon: Leanora Cover, MD;  Location: [facility];  Service: Orthopedics;  Laterality: Right;    Family History  Problem Relation Age of Onset   Diabetes Mother    Cancer Father 63       colon   Alcohol abuse Brother    Cancer Brother        stomach   Lupus Brother       Controlled substance contract: n/a     Review of Systems  Constitutional:  Negative for diaphoresis.  Eyes:  Negative for pain.  Respiratory:  Negative for shortness of breath.   Cardiovascular:  Negative for chest pain, palpitations and leg swelling.  Gastrointestinal:  Negative for abdominal pain.  Endocrine: Negative for polydipsia.  Skin:  Negative for rash.  Neurological:  Negative for dizziness, weakness and headaches.  Hematological:  Does not bruise/bleed easily.  All other systems reviewed and are negative.  Objective:   Physical Exam Vitals and nursing note reviewed.  Constitutional:      General: She is not in acute distress.    Appearance: Normal appearance. She is well-developed.  HENT:     Head: Normocephalic.     Right Ear: Tympanic membrane normal.     Left Ear: Tympanic membrane normal.     Nose: Nose normal.     Mouth/Throat:     Mouth: Mucous membranes are moist.  Eyes:     Pupils: Pupils are equal, round, and reactive to light.  Neck:     Vascular: No carotid bruit or JVD.  Cardiovascular:     Rate and Rhythm: Normal rate and regular rhythm.     Heart sounds: Normal heart sounds.  Pulmonary:      Effort: Pulmonary effort is normal. No respiratory distress.     Breath sounds: Normal breath sounds. No wheezing or rales.  Chest:     Chest wall: No tenderness.  Abdominal:     General: Bowel sounds are normal. There is no distension or abdominal bruit.     Palpations: Abdomen is soft. There is no hepatomegaly, splenomegaly, mass or pulsatile mass.     Tenderness: There is no abdominal tenderness.  Musculoskeletal:        General: Normal range of motion.     Cervical back: Normal range of motion and neck supple.  Lymphadenopathy:     Cervical: No cervical adenopathy.  Skin:    General: Skin is warm and dry.  Neurological:     Mental Status: She is alert and oriented to person, place, and time.     Deep Tendon Reflexes: Reflexes are normal and symmetric.  Psychiatric:        Behavior: Behavior normal.        Thought Content: Thought content normal.        Judgment: Judgment normal.    BP 109/71   Pulse 86   Temp 98.1 F (36.7 C) (Temporal)   Resp 20   Ht 5' 4" (1.626 m)   Wt 186 lb (84.4 kg)   SpO2 99%   BMI 31.93 kg/m   HGBAc1 6.2%       Assessment & Plan:   [name] comes in today with chief complaint of Medical Management of Chronic Issues   Diagnosis and orders addressed:  1. Primary hypertension Low sodium diet - CBC with Differential/Platelet - CMP14+EGFR - valsartan-hydrochlorothiazide (DIOVAN-HCT) 160-25 MG tablet; Take 0.5 tablets by mouth daily.  Dispense: 90 tablet; Refill: 1  2. Pure hypercholesterolemia Low fat diet - Lipid panel - rosuvastatin (CRESTOR) 10 MG tablet; Take 1 tablet (10 mg total) by mouth daily.  Dispense: 90 tablet; Refill: 1  3. Metabolic syndrome Watch carbs in diet - Bayer DCA Hb A1c Waived  4. Overactive bladder Continue myrbetriq as prescribed  5. Class 1 obesity due to excess calories with serious comorbidity and body mass index (BMI) of 33.0 to 33.9 in adult Discussed diet and exercise for person with  BMI >25 Will recheck weight in 3-6 months    Labs pending Health Maintenance reviewed Diet and exercise encouraged  Follow up plan: 6 months   Mary-Margaret Hassell Done, FNP

## 2022-03-10 NOTE — Patient Instructions (Signed)
Exercising to Stay Healthy To become healthy and stay healthy, it is recommended that you do moderate-intensity and vigorous-intensity exercise. You can tell that you are exercising at a moderate intensity if your heart starts beating faster and you start breathing faster but can still hold a conversation. You can tell that you are exercising at a vigorous intensity if you are breathing much harder and faster and cannot hold a conversation while exercising. How can exercise benefit me? Exercising regularly is important. It has many health benefits, such as: Improving overall fitness, flexibility, and endurance. Increasing bone density. Helping with weight control. Decreasing body fat. Increasing muscle strength and endurance. Reducing stress and tension, anxiety, depression, or anger. Improving overall health. What guidelines should I follow while exercising? Before you start a new exercise program, talk with your health care provider. Do not exercise so much that you hurt yourself, feel dizzy, or get very short of breath. Wear comfortable clothes and wear shoes with good support. Drink plenty of water while you exercise to prevent dehydration or heat stroke. Work out until your breathing and your heartbeat get faster (moderate intensity). How often should I exercise? Choose an activity that you enjoy, and set realistic goals. Your health care provider can help you make an activity plan that is individually designed and works best for you. Exercise regularly as told by your health care provider. This may include: Doing strength training two times a week, such as: Lifting weights. Using resistance bands. Push-ups. Sit-ups. Yoga. Doing a certain intensity of exercise for a given amount of time. Choose from these options: A total of 150 minutes of moderate-intensity exercise every week. A total of 75 minutes of vigorous-intensity exercise every week. A mix of moderate-intensity and  vigorous-intensity exercise every week. Children, pregnant women, people who have not exercised regularly, people who are overweight, and older adults may need to talk with a health care provider about what activities are safe to perform. If you have a medical condition, be sure to talk with your health care provider before you start a new exercise program. What are some exercise ideas? Moderate-intensity exercise ideas include: Walking 1 mile (1.6 km) in about 15 minutes. Biking. Hiking. Golfing. Dancing. Water aerobics. Vigorous-intensity exercise ideas include: Walking 4.5 miles (7.2 km) or more in about 1 hour. Jogging or running 5 miles (8 km) in about 1 hour. Biking 10 miles (16.1 km) or more in about 1 hour. Lap swimming. Roller-skating or in-line skating. Cross-country skiing. Vigorous competitive sports, such as football, basketball, and soccer. Jumping rope. Aerobic dancing. What are some everyday activities that can help me get exercise? Yard work, such as: Pushing a lawn mower. Raking and bagging leaves. Washing your car. Pushing a stroller. Shoveling snow. Gardening. Washing windows or floors. How can I be more active in my day-to-day activities? Use stairs instead of an elevator. Take a walk during your lunch break. If you drive, park your car farther away from your work or school. If you take public transportation, get off one stop early and walk the rest of the way. Stand up or walk around during all of your indoor phone calls. Get up, stretch, and walk around every 30 minutes throughout the day. Enjoy exercise with a friend. Support to continue exercising will help you keep a regular routine of activity. Where to find more information You can find more information about exercising to stay healthy from: U.S. Department of Health and Human Services: www.hhs.gov Centers for Disease Control and Prevention (  CDC): www.cdc.gov Summary Exercising regularly is  important. It will improve your overall fitness, flexibility, and endurance. Regular exercise will also improve your overall health. It can help you control your weight, reduce stress, and improve your bone density. Do not exercise so much that you hurt yourself, feel dizzy, or get very short of breath. Before you start a new exercise program, talk with your health care provider. This information is not intended to replace advice given to you by your health care provider. Make sure you discuss any questions you have with your health care provider. Document Revised: 01/15/2021 Document Reviewed: 01/15/2021 Elsevier Patient Education  2023 [employer]  

## 2022-03-11 LAB — CBC WITH DIFFERENTIAL/PLATELET
Basophils Absolute: 0.1 10*3/uL (ref 0.0–0.2)
Basos: 1 %
EOS (ABSOLUTE): 0.2 10*3/uL (ref 0.0–0.4)
Eos: 3 %
Hematocrit: 36.8 % (ref 34.0–46.6)
Hemoglobin: 12.4 g/dL (ref 11.1–15.9)
Immature Grans (Abs): 0 10*3/uL (ref 0.0–0.1)
Immature Granulocytes: 0 %
Lymphocytes Absolute: 1.9 10*3/uL (ref 0.7–3.1)
Lymphs: 36 %
MCH: 28 pg (ref 26.6–33.0)
MCHC: 33.7 g/dL (ref 31.5–35.7)
MCV: 83 fL (ref 79–97)
Monocytes Absolute: 0.7 10*3/uL (ref 0.1–0.9)
Monocytes: 14 %
Neutrophils Absolute: 2.4 10*3/uL (ref 1.4–7.0)
Neutrophils: 46 %
Platelets: 294 10*3/uL (ref 150–450)
RBC: 4.43 x10E6/uL (ref 3.77–5.28)
RDW: 13 % (ref 11.7–15.4)
WBC: 5.3 10*3/uL (ref 3.4–10.8)

## 2022-03-11 LAB — CMP14+EGFR
ALT: 18 IU/L (ref 0–32)
AST: 21 IU/L (ref 0–40)
Albumin/Globulin Ratio: 1.4 (ref 1.2–2.2)
Albumin: 4.4 g/dL (ref 3.7–4.7)
Alkaline Phosphatase: 83 IU/L (ref 44–121)
BUN/Creatinine Ratio: 19 (ref 12–28)
BUN: 12 mg/dL (ref 8–27)
Bilirubin Total: <0.2 mg/dL (ref 0.0–1.2)
CO2: 22 mmol/L (ref 20–29)
Calcium: 9.7 mg/dL (ref 8.7–10.3)
Chloride: 105 mmol/L (ref 96–106)
Creatinine, Ser: 0.63 mg/dL (ref 0.57–1.00)
Globulin, Total: 3.1 g/dL (ref 1.5–4.5)
Glucose: 91 mg/dL (ref 70–99)
Potassium: 4.4 mmol/L (ref 3.5–5.2)
Sodium: 142 mmol/L (ref 134–144)
Total Protein: 7.5 g/dL (ref 6.0–8.5)
eGFR: 92 mL/min/{1.73_m2} (ref >59)

## 2022-03-11 LAB — LIPID PANEL
Chol/HDL Ratio: 3.1 ratio (ref 0.0–4.4)
Cholesterol, Total: 157 mg/dL (ref 100–199)
HDL: 51 mg/dL (ref >39)
LDL Chol Calc (NIH): 87 mg/dL (ref 0–99)
Triglycerides: 102 mg/dL (ref 0–149)
VLDL Cholesterol Cal: 19 mg/dL (ref 5–40)

## 2022-03-15 ENCOUNTER — Ambulatory Visit: Payer: Medicare Other | Attending: Orthopedic Surgery

## 2022-03-15 DIAGNOSIS — M25511 Pain in right shoulder: Secondary | ICD-10-CM | POA: Insufficient documentation

## 2022-03-15 DIAGNOSIS — M7541 Impingement syndrome of right shoulder: Secondary | ICD-10-CM | POA: Diagnosis not present

## 2022-03-15 DIAGNOSIS — M25611 Stiffness of right shoulder, not elsewhere classified: Secondary | ICD-10-CM | POA: Insufficient documentation

## 2022-03-15 NOTE — Therapy (Signed)
[facility] [address] [place], [place], [address] Phone: [phone]   Fax:  [phone]  Physical Therapy Evaluation  Patient Details  Name: Ariel [name] MRN: [id number] Date of Birth: 12/23/1945 Referring Provider (PT): Romeo AppleHarrison, MD   Encounter Date: 03/15/2022   PT End of Session - 03/15/22 1432     Visit Number 1    Number of Visits 8    Date for PT Re-Evaluation 04/29/22    PT Start Time 1433    PT Stop Time 1509    PT Time Calculation (min) 36 min    Activity Tolerance Patient tolerated treatment well;Patient limited by pain    Behavior [facility] Therapy [facility] for tasks assessed/performed             Past Medical History:  Diagnosis Date   Arthritis    Diverticulosis    Hyperlipidemia    Hypertension    Osteopenia     Past Surgical History:  Procedure Laterality Date   ABDOMINAL HYSTERECTOMY  1985   CARPAL TUNNEL RELEASE Right 2007   CARPAL TUNNEL RELEASE Left 03/17/2016   Procedure: LEFT CARPAL TUNNEL RELEASE;  Surgeon: Vickki HearingStanley E Harrison, MD;  Location: [facility];  Service: Orthopedics;  Laterality: Left;   CLOSED REDUCTION FINGER WITH PERCUTANEOUS PINNING Right 06/20/2019   Procedure: RIGHT SMALL FINGER CLOSED REDUCTION PIN FIXATION;  Surgeon: Betha LoaKuzma, Kevin, MD;  Location: [facility];  Service: Orthopedics;  Laterality: Right;    There were no vitals filed for this visit.    Subjective Assessment - 03/15/22 1432     Subjective Patient reports that she feel on her right shoulder a few months back and it has been bothering her since then. She feels that her pain has been fairly steady as she is still unable to sleep on her right side. She notes that she has not had any problems with her right prior to her fall.    Pertinent History chronic left shoulder pain    Limitations Lifting    Patient Stated Goals reduced pain and improved shoulder mobiltiy    Currently in Pain? Yes    Pain Score 6      Pain Location Shoulder    Pain Orientation Right    Pain Descriptors / Indicators Other (Comment)   patient was unable to describe her pain   Pain Type Acute pain    Pain Radiating Towards will rarely radiate to her right hand    Pain Onset More than a month ago    Pain Frequency Intermittent    Aggravating Factors  raising her arm    Pain Relieving Factors medication    Effect of Pain on Daily Activities she reports that the pain does not stop her from doing anything                [facility] - 03/15/22 0001       Assessment   Medical Diagnosis Acute pain of right shoulder    Referring Provider (PT) Romeo AppleHarrison, MD    Onset Date/Surgical Date --   3 months ago   Hand Dominance Right    Next MD Visit None    Prior Therapy No      Precautions   Precautions None      Restrictions   Weight Bearing Restrictions No      Balance Screen   Has the patient fallen in the past 6 months Yes    How many times? 1  Has the patient had a decrease in activity level because of a fear of falling?  No    Is the patient reluctant to leave their home because of a fear of falling?  No      Prior Function   Level of Independence Independent    Leisure walking, household activities      Cognition   Overall Cognitive Status Within Functional Limits for tasks assessed    Attention Focused    Focused Attention Appears intact    Memory Appears intact    Awareness Appears intact    Problem Solving Appears intact      Sensation   Additional Comments Patient reports no numbness or tingling      ROM / Strength   AROM / PROM / Strength AROM;Strength      AROM   AROM Assessment Site Shoulder    Right/Left Shoulder Right;Left    Right Shoulder Flexion 102 Degrees    Right Shoulder ABduction 101 Degrees    Right Shoulder Internal Rotation --   to L3   Right Shoulder External Rotation --   to T2   Left Shoulder Flexion 92 Degrees   into scaption; painful   Left Shoulder ABduction  88 Degrees   painful   Left Shoulder Internal Rotation --   greater trocanter; limited due to pain   Left Shoulder External Rotation --   to T1; painful     Strength   Strength Assessment Site Shoulder    Right/Left Shoulder Right;Left    Right Shoulder Flexion 4-/5    Right Shoulder ABduction 4-/5    Right Shoulder Internal Rotation 3+/5    Right Shoulder External Rotation 3+/5    Left Shoulder Flexion 4-/5    Left Shoulder ABduction 4-/5   painful   Left Shoulder Internal Rotation 4-/5   limited by pain   Left Shoulder External Rotation 4-/5   limited by pain     Palpation   Palpation comment no tenderness to palpation                        Objective measurements completed on examination: See above findings.       [facility] Adult PT Treatment/Exercise - 03/15/22 0001       Exercises   Exercises Shoulder      Shoulder Exercises: Seated   Retraction Both;10 reps    Retraction Limitations limited by left shoulder pain      Shoulder Exercises: Stretch   Wall Stretch - Flexion 10 seconds   10 reps   Wall Stretch - ABduction 10 seconds;5 reps                          PT Long Term Goals - 03/15/22 1836       PT LONG TERM GOAL #1   Title Patient will be independent with her HEP.    Time 4    Period Weeks    Status New    Target Date 04/12/22      PT LONG TERM GOAL #2   Title Patient will be able to demonstrate at least 120 degrees of right shoulder flexion for improved function reaching overhead.    Time 4    Period Weeks    Status New    Target Date 04/12/22      PT LONG TERM GOAL #3   Title Patient will be able to demonstrate at least 115  degrees of right shoulder abduction for improved function with overhead activities.    Time 4    Period Weeks    Status New    Target Date 04/12/22      PT LONG TERM GOAL #4   Title Patient will be able to complete her daily activities without her right shoulder pain exceeding 3/10.    Time 4     Period Weeks    Status New    Target Date 04/12/22                    Plan - 03/15/22 1505     Clinical Impression Statement Patient is a 76 year old female presenting to physical therapy with right shoulder pain following a fall approximately three months ago. She presented with low pain severity and irritability as none of today's assessments. She was provided a HEP which included wall slides. Scapular retraction was attempted to be introduced, but she was limited by her chronic left shoulder pain which was highly irritable. This resulted in this activity being held at this time. Recommend that she continue with skilled physical therapy to address her remaining impairments to maximize her functional mobiltiy.    Personal Factors and Comorbidities Comorbidity 1;Other    Comorbidities HTN    Examination-Activity Limitations Reach Overhead;Lift    Examination-Participation Restrictions Cleaning    Stability/Clinical Decision Making Evolving/Moderate complexity    Clinical Decision Making Moderate    Rehab Potential Fair    PT Frequency 2x / week    PT Duration 4 weeks    PT Treatment/Interventions ADLs/Self Care Home Management;Cryotherapy;Electrical Stimulation;Moist Heat;Functional mobility training;Therapeutic activities;Therapeutic exercise;Neuromuscular re-education;Patient/family education;Passive range of motion;Manual techniques;Taping;Vasopneumatic Device    PT Next Visit Plan pulleys, UBE, UE ranger, UE strengthening (as able), and modalities as needed    PT Home Exercise Plan Access Code: [email or web address]  URL: [email or web address]  Date: 03/15/2022  Prepared by: Candi Leash    Exercises  - Shoulder Flexion Wall Slide with Towel  - 1 x daily - 7 x weekly - 2 sets - 10 reps - 10 seconds hold  - Standing Shoulder Abduction Slides at Wall  - 1 x daily - 7 x weekly - 2 sets - 10 reps - 10 seconds  hold    Consulted and Agree with Plan of Care Patient              Patient will benefit from skilled therapeutic intervention in order to improve the following deficits and impairments:  Decreased range of motion, Impaired UE functional use, Pain, Decreased activity tolerance, Decreased strength  Visit Diagnosis: Stiffness of right shoulder, not elsewhere classified  Acute pain of right shoulder     Problem List Patient Active Problem List   Diagnosis Date Noted   Overactive bladder 07/03/2018   Carpal tunnel syndrome, left    HTN (hypertension) 03/20/2013   HLD (hyperlipidemia) 03/20/2013   Metabolic syndrome 03/20/2013   Obesity 03/20/2013   Rationale for Evaluation and Treatment Rehabilitation   Granville Lewis, PT 03/15/2022, 6:51 PM  [facility] [address] [place], [place], [address] Phone: [phone]   Fax:  [phone]  Name: Ariel [name] MRN: [id number] Date of Birth: 1946/03/29

## 2022-03-24 ENCOUNTER — Ambulatory Visit: Payer: Medicare Other

## 2022-03-24 DIAGNOSIS — M25511 Pain in right shoulder: Secondary | ICD-10-CM | POA: Diagnosis not present

## 2022-03-24 DIAGNOSIS — M25611 Stiffness of right shoulder, not elsewhere classified: Secondary | ICD-10-CM

## 2022-03-24 DIAGNOSIS — M7541 Impingement syndrome of right shoulder: Secondary | ICD-10-CM | POA: Diagnosis not present

## 2022-03-24 NOTE — Therapy (Addendum)
[facility] [place], [place], [address] Phone: [phone]   Fax:  [phone]  Physical Therapy Treatment  Patient Details  Name: Ariel [name] MRN: [id number] Date of Birth: Apr 01, 1946 Referring Provider (PT): Aline Brochure, MD   Encounter Date: 03/24/2022   PT End of Session - 03/24/22 1434     Visit Number 2    Number of Visits 8    Date for PT Re-Evaluation 04/29/22    PT Start Time 1430    PT Stop Time 6010    PT Time Calculation (min) 45 min    Activity Tolerance Patient tolerated treatment well;Patient limited by pain    Behavior During Therapy [facility] for tasks assessed/performed             Past Medical History:  Diagnosis Date   Arthritis    Diverticulosis    Hyperlipidemia    Hypertension    Osteopenia     Past Surgical History:  Procedure Laterality Date   ABDOMINAL HYSTERECTOMY  1985   CARPAL TUNNEL RELEASE Right 2007   CARPAL TUNNEL RELEASE Left 03/17/2016   Procedure: LEFT CARPAL TUNNEL RELEASE;  Surgeon: Carole Civil, MD;  Location: [facility];  Service: Orthopedics;  Laterality: Left;   CLOSED REDUCTION FINGER WITH PERCUTANEOUS PINNING Right 06/20/2019   Procedure: RIGHT SMALL FINGER CLOSED REDUCTION PIN FIXATION;  Surgeon: Leanora Cover, MD;  Location: [place];  Service: Orthopedics;  Laterality: Right;    There were no vitals filed for this visit.   Subjective Assessment - 03/24/22 1432     Subjective Pt arrives for today's treatment session denying any pain, but reports that her right arm goes numb at times especially during her sleep.    Pertinent History chronic left shoulder pain    Limitations Lifting    Patient Stated Goals reduced pain and improved shoulder mobiltiy    Currently in Pain? No/denies    Pain Onset More than a month ago                               [facility] Adult PT Treatment/Exercise - 03/24/22 0001       Shoulder Exercises:  Seated   Extension Both;20 reps;Theraband    Theraband Level (Shoulder Extension) Level 2 (Red)    Row Both;20 reps;Theraband    Theraband Level (Shoulder Row) Level 2 (Red)    Horizontal ABduction Both;20 reps;Theraband    Theraband Level (Shoulder Horizontal ABduction) Level 2 (Red)    External Rotation Both;20 reps;Theraband    Theraband Level (Shoulder External Rotation) Level 2 (Red)    Internal Rotation Both;20 reps;Theraband    Theraband Level (Shoulder Internal Rotation) Level 2 (Red)    Flexion Both;20 reps;Theraband    Theraband Level (Shoulder Flexion) Level 2 (Red)      Shoulder Exercises: Pulleys   Flexion 5 minutes      Shoulder Exercises: ROM/Strengthening   UBE (Upper Arm Bike) 120 RPM x 10 mins (5 mins forward/backward)    Ranger Seated: flex/ext x 2 mins, circles CW/CCW x 2 mins                          PT Long Term Goals - 03/15/22 1836       PT LONG TERM GOAL #1   Title Patient will be independent with her HEP.    Time 4    Period Weeks  Status New    Target Date 04/12/22      PT LONG TERM GOAL #2   Title Patient will be able to demonstrate at least 120 degrees of right shoulder flexion for improved function reaching overhead.    Time 4    Period Weeks    Status New    Target Date 04/12/22      PT LONG TERM GOAL #3   Title Patient will be able to demonstrate at least 115 degrees of right shoulder abduction for improved function with overhead activities.    Time 4    Period Weeks    Status New    Target Date 04/12/22      PT LONG TERM GOAL #4   Title Patient will be able to complete her daily activities without her right shoulder pain exceeding 3/10.    Time 4    Period Weeks    Status New    Target Date 04/12/22                   Plan - 03/24/22 1434     Clinical Impression Statement Pt arrives for today's treatment session denying any pain, but states that her left UE goes numb at times.  She reports that he has  notified her MD about this.  Pt introduced to numerous seated B shoulder exercises to increase strength, function, and ROM.  Pt requiring min cues for proper techniques with all newly added exercises.  Pt given printed HEP and red theraband during today's session.  Pt denied any pain at completion of today's treatment session.    Personal Factors and Comorbidities Comorbidity 1;Other    Comorbidities HTN    Examination-Activity Limitations Reach Overhead;Lift    Examination-Participation Restrictions Cleaning    Stability/Clinical Decision Making Evolving/Moderate complexity    Rehab Potential Fair    PT Frequency 2x / week    PT Duration 4 weeks    PT Treatment/Interventions ADLs/Self Care Home Management;Cryotherapy;Electrical Stimulation;Moist Heat;Functional mobility training;Therapeutic activities;Therapeutic exercise;Neuromuscular re-education;Patient/family education;Passive range of motion;Manual techniques;Taping;Vasopneumatic Device    PT Next Visit Plan pulleys, UBE, UE ranger, UE strengthening (as able), and modalities as needed    PT Home Exercise Plan Access Code: [email or web address]  URL: [email or web address]  Date: 03/15/2022  Prepared by: Jacqulynn Cadet    Exercises  - Shoulder Flexion Wall Slide with Towel  - 1 x daily - 7 x weekly - 2 sets - 10 reps - 10 seconds hold  - Standing Shoulder Abduction Slides at Wall  - 1 x daily - 7 x weekly - 2 sets - 10 reps - 10 seconds  hold    Consulted and Agree with Plan of Care Patient             Patient will benefit from skilled therapeutic intervention in order to improve the following deficits and impairments:  Decreased range of motion, Impaired UE functional use, Pain, Decreased activity tolerance, Decreased strength  Visit Diagnosis: Stiffness of right shoulder, not elsewhere classified  Acute pain of right shoulder     Problem List Patient Active Problem List   Diagnosis Date Noted   Overactive bladder 07/03/2018    Carpal tunnel syndrome, left    HTN (hypertension) 03/20/2013   HLD (hyperlipidemia) 56/15/3794   Metabolic syndrome 32/76/1470   Obesity 03/20/2013   Rationale for Evaluation and Treatment Rehabilitation  Kathrynn Ducking, PTA 03/24/2022, 3:21 PM  [facility] [address] [place], [place], [address] Phone: [phone]  Fax:  [phone]  Name: Ariel [name] MRN: [id number] Date of Birth: Jul 08, 1946  PHYSICAL THERAPY DISCHARGE SUMMARY  Visits from Start of Care: 2  Current functional level related to goals / functional outcomes: Patient failed to complete her recommended POC and is being discharged at this time.    Remaining deficits: Pain and AROM    Education / Equipment: HEP    Patient agrees to discharge. Patient goals were not met. Patient is being discharged due to not returning since the last visit.  Jacqulynn Cadet, PT, DPT

## 2022-03-30 ENCOUNTER — Encounter: Payer: Medicare Other | Admitting: Physical Therapy

## 2022-03-31 ENCOUNTER — Encounter: Payer: Medicare Other | Admitting: Physical Therapy

## 2022-05-18 ENCOUNTER — Ambulatory Visit (INDEPENDENT_AMBULATORY_CARE_PROVIDER_SITE_OTHER): Payer: Medicare Other

## 2022-05-18 ENCOUNTER — Encounter: Payer: Self-pay | Admitting: Family Medicine

## 2022-05-18 ENCOUNTER — Ambulatory Visit (INDEPENDENT_AMBULATORY_CARE_PROVIDER_SITE_OTHER): Payer: Medicare Other | Admitting: Family Medicine

## 2022-05-18 VITALS — BP 103/64 | HR 87 | Temp 97.5°F | Ht 64.0 in | Wt 187.0 lb

## 2022-05-18 DIAGNOSIS — M25561 Pain in right knee: Secondary | ICD-10-CM

## 2022-05-18 MED ORDER — PREDNISONE 10 MG PO TABS: ORAL_TABLET | ORAL | 0 refills | Status: DC

## 2022-05-18 NOTE — Progress Notes (Signed)
Subjective:  Patient ID: Ariel [name], female    DOB: 25-Feb-1946  Age: 76 y.o. MRN: [id number]  CC: Knee Pain (right)   HPI Ariel [name] presents for swelling onset 3-4 days ago. Right knee medially. Painful. Not relieved by Tylenol. NKI. Soreness. KNot laterally when bending the knee.      05/18/2022    2:18 PM 03/10/2022    2:52 PM 02/11/2022   10:04 AM  Depression screen PHQ 2/9  Decreased Interest 0 0 0  Down, Depressed, Hopeless 0 0 0  PHQ - 2 Score 0 0 0  Altered sleeping  0   Tired, decreased energy  0   Change in appetite  0   Feeling bad or failure about yourself   0   Trouble concentrating  0   Moving slowly or fidgety/restless  0   Suicidal thoughts  0   PHQ-9 Score  0   Difficult doing work/chores  Not difficult at all     History Ariel [name] has a past medical history of Arthritis, Diverticulosis, Hyperlipidemia, Hypertension, and Osteopenia.   She has a past surgical history that includes Abdominal hysterectomy (1985); Carpal tunnel release (Right, 2007); Carpal tunnel release (Left, 03/17/2016); and Closed reduction finger with percutaneous pinning (Right, 06/20/2019).   Her family history includes Alcohol abuse in her brother; Cancer in her brother; Cancer (age of onset: 78) in her father; Diabetes in her mother; Lupus in her brother.She reports that she has never smoked. She has never used smokeless tobacco. She reports current alcohol use. She reports that she does not use drugs.    ROS Review of Systems  Constitutional:  Positive for activity change (couldn't work since onset).  Musculoskeletal:  Positive for gait problem (limping, favoring RLE).    Objective:  BP 103/64   Pulse 87   Temp (!) 97.5 F (36.4 C)   Ht 5\' 4"  (1.626 m)   Wt 187 lb (84.8 kg)   SpO2 95%   BMI 32.10 kg/m   BP Readings from Last 3 Encounters:  05/18/22 103/64  03/10/22 109/71  02/07/22 121/69    Wt Readings from Last 3 Encounters:  05/18/22 187 lb (84.8 kg)   03/10/22 186 lb (84.4 kg)  02/11/22 186 lb (84.4 kg)     Physical Exam Constitutional:      Appearance: Normal appearance. She is obese.  HENT:     Head: Normocephalic.  Cardiovascular:     Rate and Rhythm: Normal rate and regular rhythm.  Pulmonary:     Breath sounds: Normal breath sounds.  Musculoskeletal:        General: Tenderness (meidal right knee. Knot not appreciated. McMurray, Drawer, Collateral stress negative, may be limited due to some voluntary guarding) present.  Skin:    General: Skin is warm and dry.  Neurological:     Mental Status: She is alert.  Psychiatric:        Behavior: Behavior normal.        Thought Content: Thought content normal.    XR, right knee. Extensive subpatellar spur formation   Assessment & Plan:   Ariel [name] was seen today for knee pain.  Diagnoses and all orders for this visit:  Acute pain of right knee -     DG Knee 1-2 Views Right; Future -     predniSONE (DELTASONE) 10 MG tablet; Take 5 daily for 2 days followed by 4,3,2 and 1 for 2 days each. -     Ambulatory referral to Orthopedics  Apply heat several times a day. Frequent ROM exercises encouraged    I am having Ariel [name] start on predniSONE. I am also having her maintain her ibuprofen, valsartan-hydrochlorothiazide, rosuvastatin, and mirabegron ER.  Allergies as of 05/18/2022   No Known Allergies      Medication List        Accurate as of May 18, 2022  3:09 PM. If you have any questions, ask your nurse or doctor.          ibuprofen 800 MG tablet Commonly known as: ADVIL TAKE 1 TABLET BY MOUTH EVERY 8 HOURS AS NEEDED   mirabegron ER 50 MG Tb24 tablet Commonly known as: Myrbetriq Take 1 tablet (50 mg total) by mouth daily.   predniSONE 10 MG tablet Commonly known as: DELTASONE Take 5 daily for 2 days followed by 4,3,2 and 1 for 2 days each. Started by: Mechele Claude, MD   rosuvastatin 10 MG tablet Commonly known as: CRESTOR Take 1 tablet  (10 mg total) by mouth daily.   valsartan-hydrochlorothiazide 160-25 MG tablet Commonly known as: DIOVAN-HCT Take 0.5 tablets by mouth daily.         Follow-up: Return if symptoms worsen or fail to improve.  Mechele Claude, M.D.

## 2022-05-23 ENCOUNTER — Encounter: Payer: Self-pay | Admitting: Family Medicine

## 2022-05-23 ENCOUNTER — Ambulatory Visit (INDEPENDENT_AMBULATORY_CARE_PROVIDER_SITE_OTHER): Payer: Medicare Other | Admitting: Family Medicine

## 2022-05-23 VITALS — BP 122/68 | HR 70 | Temp 98.0°F | Ht 64.0 in | Wt 189.0 lb

## 2022-05-23 DIAGNOSIS — M1711 Unilateral primary osteoarthritis, right knee: Secondary | ICD-10-CM

## 2022-05-23 MED ORDER — METHYLPREDNISOLONE ACETATE 80 MG/ML IJ SUSP
80.0000 mg | Freq: Once | INTRAMUSCULAR | Status: AC
  Administered 2022-05-23: 80 mg via INTRAMUSCULAR

## 2022-05-23 NOTE — Progress Notes (Signed)
BP 122/68   Pulse 70   Temp 98 F (36.7 C)   Ht 5\' 4"  (1.626 m)   Wt 189 lb (85.7 kg)   SpO2 100%   BMI 32.44 kg/m    Subjective:   Patient ID: , female    DOB: 02-Jul-1946, 76 y.o.   MRN: [id number]  HPI: Ariel [name] is a 76 y.o. female presenting on 05/23/2022 for Knee Pain (Right- arthritis seen on xray last week. Unable to take prednisone due to upsetting stomach)   HPI Patient is complaining right knee plain today.  It is continue to bother her.  Is been bothering her since she has been having problems with it over the past few months.  She did get seen for this just over a week ago and was given oral prednisone and it irritated her stomach too much that she can only take a day and half of it before she was no longer able to take it.  She said it did help some.  She denies any popping or catching or giving way.  She just says that it feels swollen and painful there.  She says it is especially painful on the inside of her right knee.  Relevant past medical, surgical, family and social history reviewed and updated as indicated. Interim medical history since our last visit reviewed. Allergies and medications reviewed and updated.  Review of Systems  Constitutional:  Negative for chills and fever.  Eyes:  Negative for visual disturbance.  Respiratory:  Negative for chest tightness and shortness of breath.   Cardiovascular:  Negative for chest pain and leg swelling.  Musculoskeletal:  Positive for arthralgias, gait problem and joint swelling. Negative for back pain.  Skin:  Negative for color change and rash.  Psychiatric/Behavioral:  Negative for agitation and behavioral problems.   All other systems reviewed and are negative.   Per HPI unless specifically indicated above   Allergies as of 05/23/2022   No Known Allergies      Medication List        Accurate as of May 23, 2022  2:41 PM. If you have any questions, ask your nurse or doctor.           STOP taking these medications    predniSONE 10 MG tablet Commonly known as: DELTASONE Stopped by: May 25, 2022 Ariel Grantham, MD   rosuvastatin 10 MG tablet Commonly known as: CRESTOR Stopped by: Ariel Radon [name], MD       TAKE these medications    ibuprofen 800 MG tablet Commonly known as: ADVIL TAKE 1 TABLET BY MOUTH EVERY 8 HOURS AS NEEDED   mirabegron ER 50 MG Tb24 tablet Commonly known as: Myrbetriq Take 1 tablet (50 mg total) by mouth daily.   valsartan-hydrochlorothiazide 160-25 MG tablet Commonly known as: DIOVAN-HCT Take 0.5 tablets by mouth daily.         Objective:   BP 122/68   Pulse 70   Temp 98 F (36.7 C)   Ht 5\' 4"  (1.626 m)   Wt 189 lb (85.7 kg)   SpO2 100%   BMI 32.44 kg/m   Wt Readings from Last 3 Encounters:  05/23/22 189 lb (85.7 kg)  05/18/22 187 lb (84.8 kg)  03/10/22 186 lb (84.4 kg)    Physical Exam Vitals and nursing note reviewed.  Constitutional:      Appearance: Normal appearance.  Musculoskeletal:     Right knee: Effusion and crepitus present. Tenderness present over the medial joint  line. No LCL laxity, MCL laxity, ACL laxity or PCL laxity. Normal meniscus and normal patellar mobility.  Neurological:     Mental Status: She is alert.     Knee injection: Consent form signed. Risk factors of bleeding and infection discussed with patient and patient is agreeable towards injection. Patient prepped with Betadine. Lateral approach towards injection used. Injected 80 mg of Depo-Medrol and 1 mL of 2% lidocaine. Patient tolerated procedure well and no side effects from noted. Minimal to no bleeding. Simple bandage applied after.   Assessment & Plan:   Problem List Items Addressed This Visit   None Visit Diagnoses     Primary osteoarthritis of right knee    -  Primary   Relevant Medications   methylPREDNISolone acetate (DEPO-MEDROL) injection 80 mg (Completed)       We will do the injection she can follow-up with  orthopedic but does not help. Follow up plan: Return if symptoms worsen or fail to improve.  Counseling provided for all of the vaccine components No orders of the defined types were placed in this encounter.   Ariel Care, MD [facility] 05/23/2022, 2:41 PM

## 2022-06-02 ENCOUNTER — Ambulatory Visit: Payer: Medicare Other | Admitting: Orthopedic Surgery

## 2022-06-13 ENCOUNTER — Ambulatory Visit: Payer: Medicare Other | Admitting: Orthopedic Surgery

## 2022-06-19 IMAGING — MG MM DIGITAL SCREENING BILAT W/ TOMO AND CAD
6 of 12 series · 6 of 36 positions shown · non-contrast
Comparison: Previous exam(s).

CLINICAL DATA: Screening.

EXAM:
DIGITAL SCREENING BILATERAL MAMMOGRAM WITH TOMOSYNTHESIS AND CAD
TECHNIQUE: Bilateral screening digital craniocaudal and mediolateral oblique
mammograms were obtained. Bilateral screening digital breast
tomosynthesis was performed. The images were evaluated with
computer-aided detection.

[R MLO synth-2D (1 of 2)]
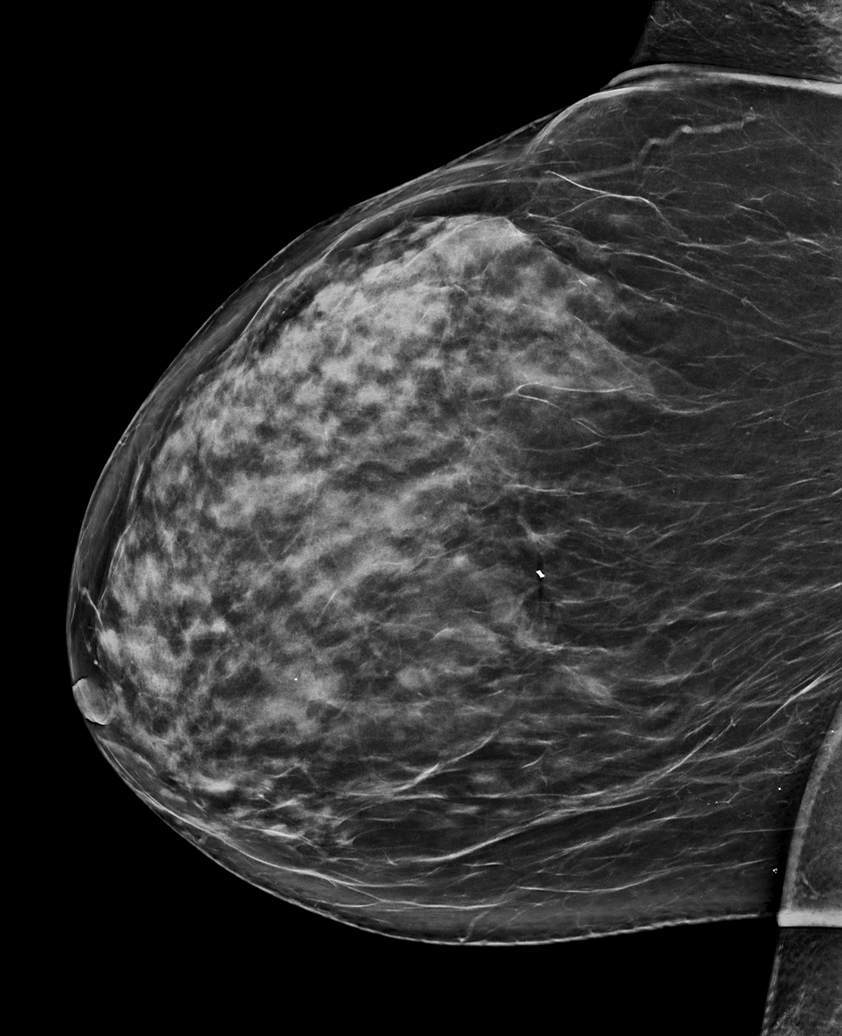

[R CC synth-2D (1 of 2)]
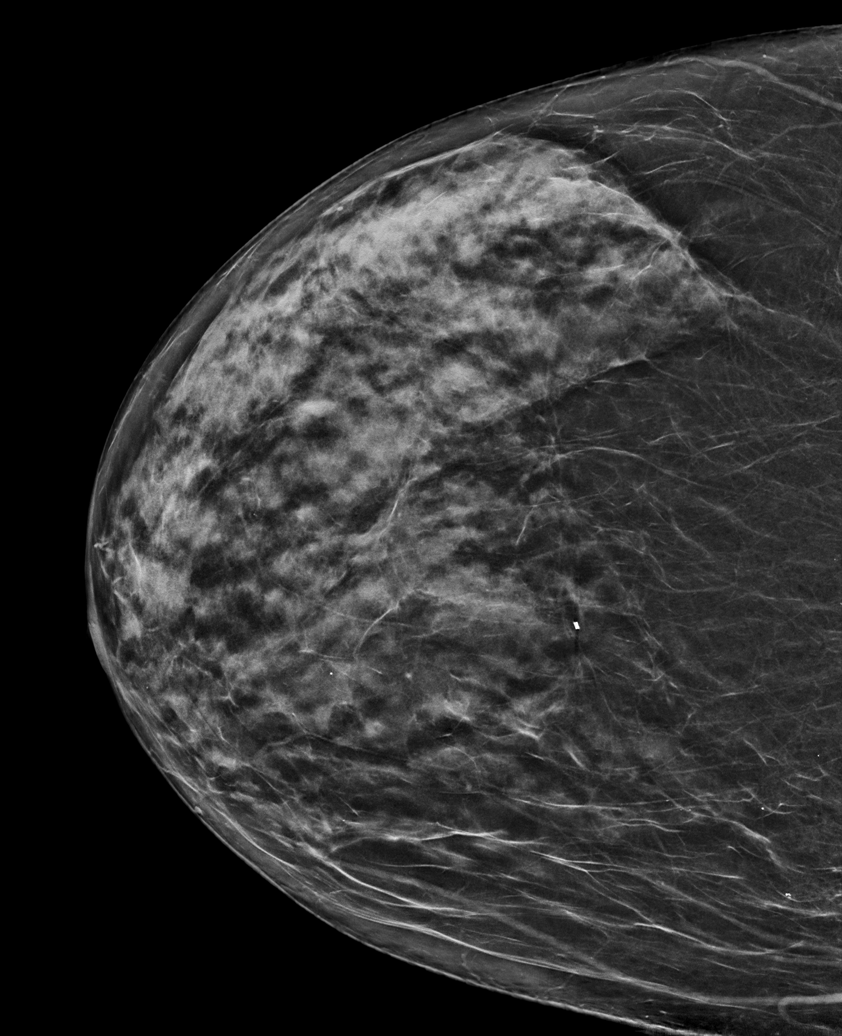

[R CC synth-2D (2 of 2)]
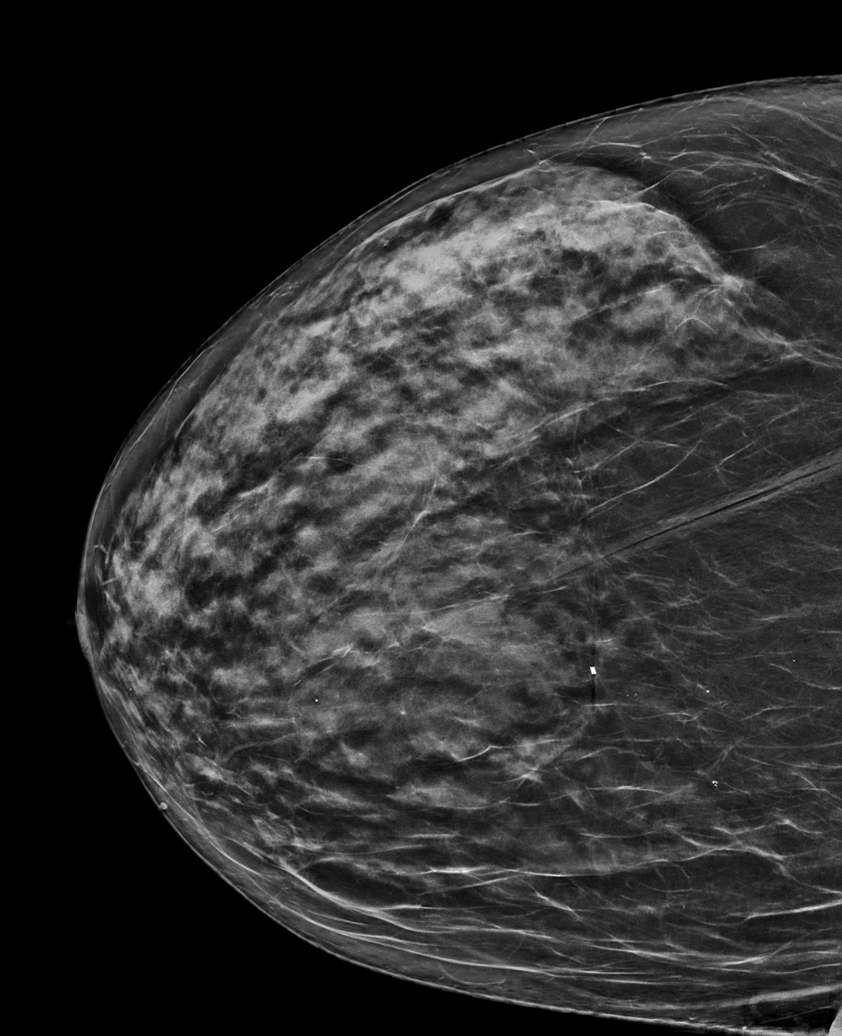

[R MLO synth-2D (2 of 2)]
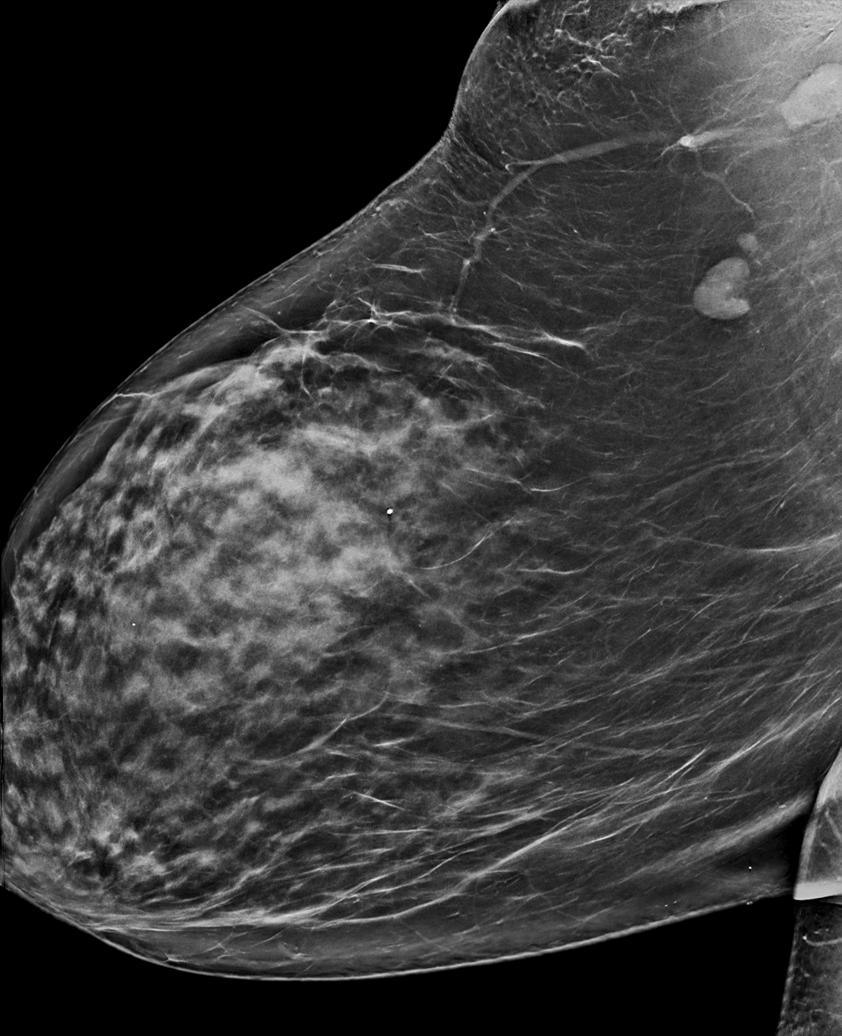

[L CC synth-2D]
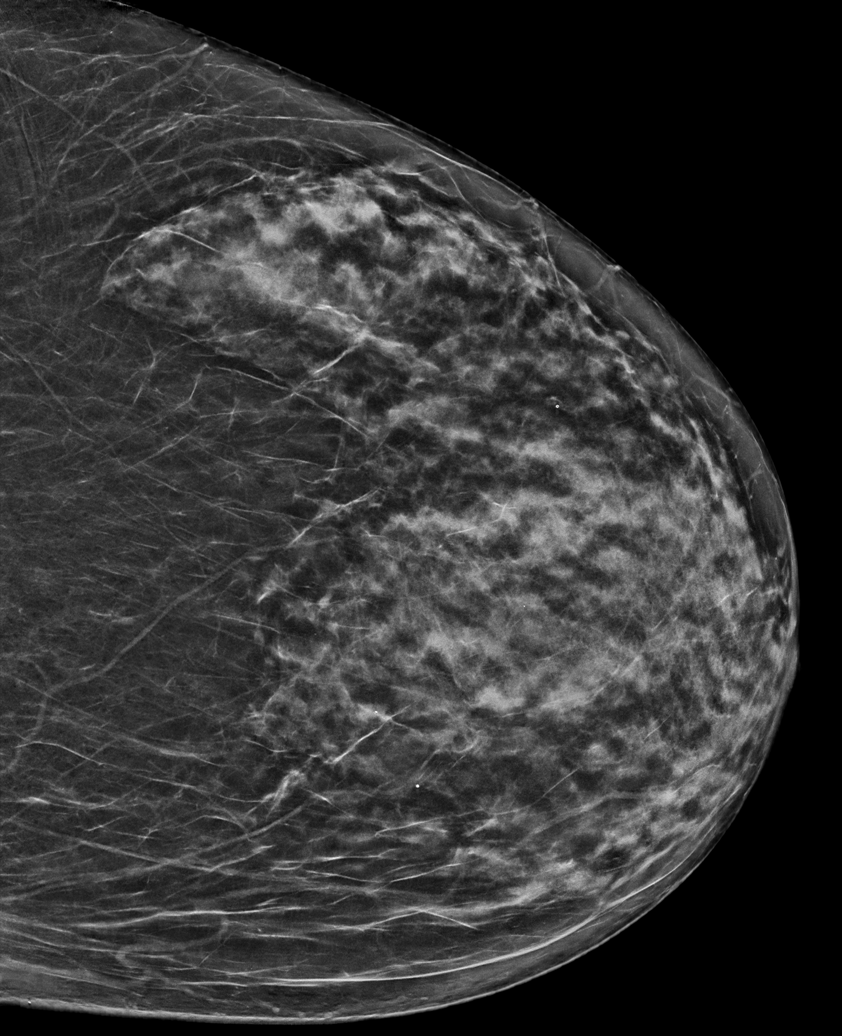

[L MLO synth-2D]
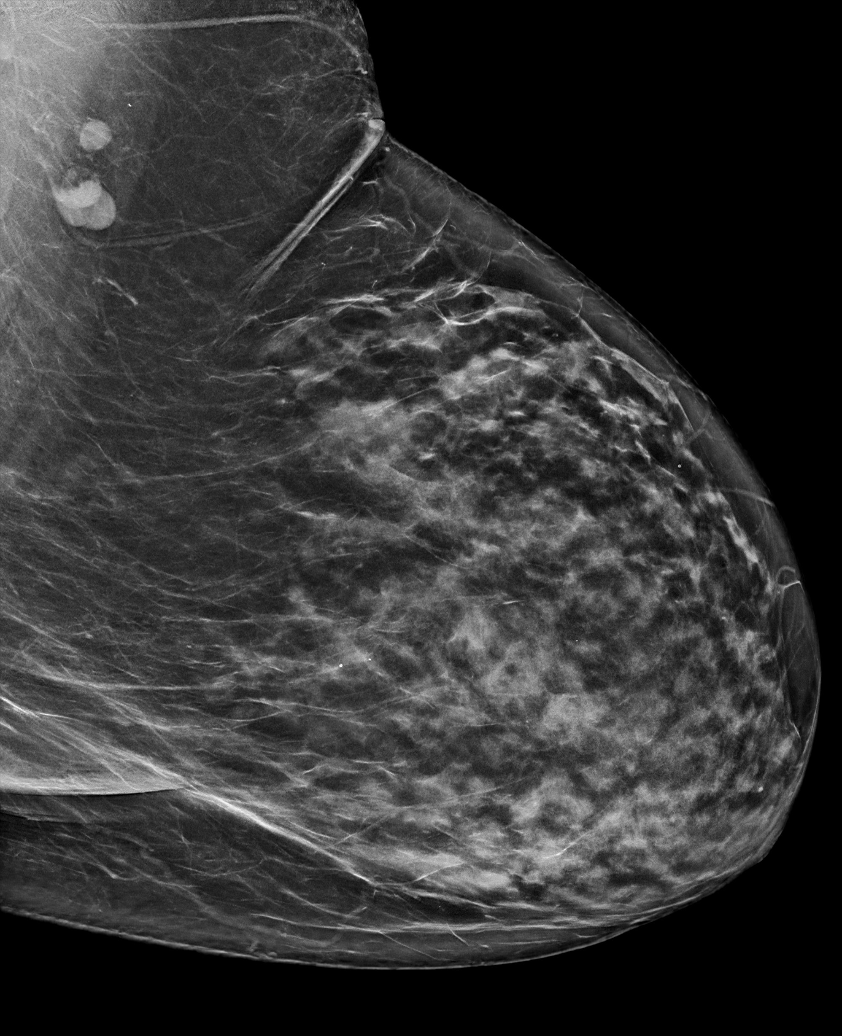

[6 of 36 positions shown; findings below may reference images not displayed]

ACR Breast Density Category c: The breast tissue is heterogeneously
dense, which may obscure small masses.
FINDINGS: There are no findings suspicious for malignancy.
IMPRESSION: No mammographic evidence of malignancy. A result letter of this
screening mammogram will be mailed directly to the patient.

RECOMMENDATION:
Screening mammogram in one year. (Code:Q3-W-BC3)

BI-RADS CATEGORY  1: Negative.

## 2022-07-21 ENCOUNTER — Ambulatory Visit: Payer: Medicare Other | Admitting: Orthopedic Surgery

## 2022-07-21 ENCOUNTER — Ambulatory Visit: Payer: Medicare Other | Admitting: Nurse Practitioner

## 2022-07-28 ENCOUNTER — Ambulatory Visit (INDEPENDENT_AMBULATORY_CARE_PROVIDER_SITE_OTHER): Payer: Medicare Other

## 2022-07-28 ENCOUNTER — Encounter: Payer: Self-pay | Admitting: Nurse Practitioner

## 2022-07-28 ENCOUNTER — Other Ambulatory Visit (HOSPITAL_COMMUNITY)
Admission: RE | Admit: 2022-07-28 | Discharge: 2022-07-28 | Disposition: A | Discharge status: Home / Self Care | Payer: Medicare Other | Source: Ambulatory Visit | Attending: Nurse Practitioner | Admitting: Nurse Practitioner

## 2022-07-28 ENCOUNTER — Ambulatory Visit (INDEPENDENT_AMBULATORY_CARE_PROVIDER_SITE_OTHER): Payer: Medicare Other | Admitting: Nurse Practitioner

## 2022-07-28 VITALS — BP 112/60 | HR 81 | Temp 97.3°F | Resp 20 | Ht 64.0 in | Wt 186.0 lb

## 2022-07-28 DIAGNOSIS — N9089 Other specified noninflammatory disorders of vulva and perineum: Secondary | ICD-10-CM | POA: Diagnosis not present

## 2022-07-28 DIAGNOSIS — R109 Unspecified abdominal pain: Secondary | ICD-10-CM

## 2022-07-28 DIAGNOSIS — K5901 Slow transit constipation: Secondary | ICD-10-CM

## 2022-07-28 MED ORDER — CEPHALEXIN 500 MG PO CAPS: 500.0000 mg | ORAL_CAPSULE | Freq: Three times a day (TID) | ORAL | 0 refills | Status: DC

## 2022-07-28 NOTE — Patient Instructions (Signed)

## 2022-07-28 NOTE — Progress Notes (Signed)
Subjective:    Patient ID: Ariel Ariel [name], female    DOB: [birth year], 76 y.o.   MRN: [id number]   Chief Complaint: boil  HPI  Patient has 2 complaints: - sore lesion on left side of perineum. Started about a week ago. It busted a few days ago. It is not draining now. Still sore  to touch. - abdominal pain for the last 2 days. Pain is intermittent. Rates pain 5/10. Heat helps. Nothing seems to make it worse. Bowel movements have been a little harder.  Review of Systems  Constitutional:  Negative for diaphoresis.  Eyes:  Negative for pain.  Respiratory:  Negative for shortness of breath.   Cardiovascular:  Negative for chest pain, palpitations and leg swelling.  Gastrointestinal:  Negative for abdominal pain.  Endocrine: Negative for polydipsia.  Skin:  Negative for rash.  Neurological:  Negative for dizziness, weakness and headaches.  Hematological:  Does not bruise/bleed easily.  All other systems reviewed and are negative.      Objective:   Physical Exam Vitals and nursing note reviewed.  Constitutional:      General: She is not in acute distress.    Appearance: Normal appearance. She is well-developed.  Neck:     Vascular: No carotid bruit or JVD.  Cardiovascular:     Rate and Rhythm: Normal rate and regular rhythm.     Heart sounds: Normal heart sounds.  Pulmonary:     Effort: Pulmonary effort is normal. No respiratory distress.     Breath sounds: Normal breath sounds. No wheezing or rales.  Chest:     Chest wall: No tenderness.  Abdominal:     General: Bowel sounds are normal. There is no distension or abdominal bruit.     Palpations: Abdomen is soft. There is no hepatomegaly, splenomegaly, mass or pulsatile mass.     Tenderness: There is no abdominal tenderness.  Genitourinary:    Ariel: No vaginal discharge.     Rectum: Normal.     Comments: Small tender indurated lesion on left labia- no drainage Vaginal Ariel [name] intact No adnexal masses or  tenderness  Musculoskeletal:        General: Normal range of motion.     Cervical back: Normal range of motion and neck supple.  Lymphadenopathy:     Cervical: No cervical adenopathy.  Skin:    General: Skin is warm and dry.  Neurological:     Mental Status: She is alert and oriented to person, place, and time.     Deep Tendon Reflexes: Reflexes are normal and symmetric.  Psychiatric:        Behavior: Behavior normal.        Thought Content: Thought content normal.        Judgment: Judgment normal.    BP 112/60   Pulse 81   Temp (!) 97.3 F (36.3 C) (Temporal)   Resp 20   Ht 5\' 4"  (1.626 m)   Wt 186 lb (84.4 kg)   SpO2 97%   BMI 31.93 kg/m    KUB- moderate school burden-Preliminary reading by Ariel Collum, FNP  [facility]      Assessment & Plan:   Ariel Ariel [name] comes in today with chief complaint of Recurrent Skin Infections   Diagnosis and orders addressed:  1. Labial lesion Warm soaks - Cytology - PAP - cephALEXin (KEFLEX) 500 MG capsule; Take 1 capsule (500 mg total) by mouth 3 (three) times daily.  Dispense: 30 capsule; Refill: 0  2. Left  sided abdominal pain - DG Abd 1 View  3. Slow transit constipation Milk of magnesia and prune juice Increase fiber in diet   Labs pending Health Maintenance reviewed Diet and exercise encouraged  Follow up plan: prn   Ariel Daphine Deutscher, FNP

## 2022-08-01 LAB — CYTOLOGY - PAP: Diagnosis: NEGATIVE

## 2022-08-08 ENCOUNTER — Telehealth: Payer: Self-pay | Admitting: Radiology

## 2022-08-08 DIAGNOSIS — G8929 Other chronic pain: Secondary | ICD-10-CM

## 2022-08-08 NOTE — Addendum Note (Signed)
Addended byCandice Camp on: 08/08/2022 01:23 PM   Modules accepted: Orders

## 2022-08-08 NOTE — Telephone Encounter (Signed)
Physical therapy order was sent in May to [facility]

## 2022-08-08 NOTE — Telephone Encounter (Signed)
Patient called and LMVM, couldn't understand her well.  Asked about physical therapy I think. Please call her.

## 2022-08-16 ENCOUNTER — Ambulatory Visit: Payer: Medicare Other

## 2022-08-17 DIAGNOSIS — H1045 Other chronic allergic conjunctivitis: Secondary | ICD-10-CM | POA: Diagnosis not present

## 2022-08-17 DIAGNOSIS — H02882 Meibomian gland dysfunction right lower eyelid: Secondary | ICD-10-CM | POA: Diagnosis not present

## 2022-08-17 DIAGNOSIS — H02885 Meibomian gland dysfunction left lower eyelid: Secondary | ICD-10-CM | POA: Diagnosis not present

## 2022-08-18 ENCOUNTER — Encounter: Payer: Self-pay | Admitting: Orthopedic Surgery

## 2022-08-18 ENCOUNTER — Ambulatory Visit (INDEPENDENT_AMBULATORY_CARE_PROVIDER_SITE_OTHER): Payer: Medicare Other | Admitting: Orthopedic Surgery

## 2022-08-18 VITALS — Ht 64.0 in | Wt 186.0 lb

## 2022-08-18 DIAGNOSIS — G8929 Other chronic pain: Secondary | ICD-10-CM | POA: Diagnosis not present

## 2022-08-18 DIAGNOSIS — M25512 Pain in left shoulder: Secondary | ICD-10-CM | POA: Diagnosis not present

## 2022-08-18 NOTE — Patient Instructions (Signed)

## 2022-08-18 NOTE — Progress Notes (Signed)
Chief Complaint  Patient presents with   Shoulder Pain    Lt shoulder here for injection    Ariel [name] that is 76 years old she comes in requesting a left shoulder injection she has arthritis of the left shoulder documented by MRI back in 2020 she is not interested in surgery at this time.  On the MRI in 2020 she had an intact rotator cuff  Procedure note the subacromial injection shoulder left   Verbal consent was obtained to inject the  Left   Shoulder  Timeout was completed to confirm the injection site is a subacromial space of the  left  shoulder  Medication used Depo-Medrol 40 mg and lidocaine 1% 3 cc  Anesthesia was provided by ethyl chloride  The injection was performed in the left  posterior subacromial space. After pinning the skin with alcohol and anesthetized the skin with ethyl chloride the subacromial space was injected using a 20-gauge needle. There were no complications  Sterile dressing was applied.  Encounter Diagnosis  Name Primary?   Chronic pain in left shoulder Yes

## 2022-08-22 ENCOUNTER — Ambulatory Visit: Payer: Medicare Other | Attending: Orthopedic Surgery

## 2022-08-22 DIAGNOSIS — M25511 Pain in right shoulder: Secondary | ICD-10-CM | POA: Insufficient documentation

## 2022-08-22 DIAGNOSIS — M6281 Muscle weakness (generalized): Secondary | ICD-10-CM | POA: Insufficient documentation

## 2022-08-22 DIAGNOSIS — G8929 Other chronic pain: Secondary | ICD-10-CM | POA: Diagnosis not present

## 2022-08-22 NOTE — Therapy (Addendum)
OUTPATIENT PHYSICAL THERAPY SHOULDER EVALUATION   Patient Name: Ariel [name] MRN: [id number] DOB:1945-12-20, 76 y.o., female Today's Date: 08/22/2022  END OF SESSION:  PT End of Session - 08/22/22 1030     Visit Number 1    Number of Visits 6    Date for PT Re-Evaluation 09/30/22    PT Start Time 1034    PT Stop Time 1115    PT Time Calculation (min) 41 min    Activity Tolerance Patient tolerated treatment well    Behavior During Therapy [facility] for tasks assessed/performed             Past Medical History:  Diagnosis Date   Arthritis    Diverticulosis    Hyperlipidemia    Hypertension    Osteopenia    Past Surgical History:  Procedure Laterality Date   ABDOMINAL HYSTERECTOMY  1985   CARPAL TUNNEL RELEASE Right 2007   CARPAL TUNNEL RELEASE Left 03/17/2016   Procedure: LEFT CARPAL TUNNEL RELEASE;  Surgeon: Carole Civil, MD;  Location: [facility];  Service: Orthopedics;  Laterality: Left;   CLOSED REDUCTION FINGER WITH PERCUTANEOUS PINNING Right 06/20/2019   Procedure: RIGHT SMALL FINGER CLOSED REDUCTION PIN FIXATION;  Surgeon: Leanora Cover, MD;  Location: [facility];  Service: Orthopedics;  Laterality: Right;   Patient Active Problem List   Diagnosis Date Noted   Overactive bladder 07/03/2018   Carpal tunnel syndrome, left    HTN (hypertension) 03/20/2013   HLD (hyperlipidemia) 123456   Metabolic syndrome 123456   Obesity 03/20/2013   REFERRING PROVIDER: Carole Civil, MD   REFERRING DIAG: Acute pain of right shoulder   THERAPY DIAG:  Chronic right shoulder pain  Muscle weakness (generalized)  Rationale for Evaluation and Treatment: Rehabilitation  ONSET DATE: about 1 year ago  SUBJECTIVE:                                                                                                                                                                                      SUBJECTIVE STATEMENT: Patient reports that her right  shoulder has been hurting since she fell about a year ago. She notes that her pain has been staying about the same, but it really hurts trying to lay on her right side.   PERTINENT HISTORY: Hypertension, osteoarthritis, osteopenia, and chronic left shoulder pain  PAIN:  Are you having pain? Yes: NPRS scale: 5/10 Pain location: anterior right shoulder Pain description: aching Aggravating factors: laying on her right shoulder, reaching overhead Relieving factors: Tylenol   PRECAUTIONS: None  WEIGHT BEARING RESTRICTIONS: No  FALLS:  Has patient fallen in last 6 months? No  LIVING ENVIRONMENT:  Lives with: lives with their spouse Lives in: House/apartment  OCCUPATION: Retired  PLOF: Independent  PATIENT GOALS: reduced pain  NEXT MD VISIT: 09/07/22  OBJECTIVE:   DIAGNOSTIC FINDINGS: 02/07/22 right shoulder x-ray  Impression normal right shoulder with type II acromion   PATIENT SURVEYS:  FOTO 63.33  COGNITION: Overall cognitive status: Within functional limits for tasks assessed     SENSATION: Patient reports no numbness or tingling  POSTURE: Forward head and rounded shoulders  UPPER EXTREMITY ROM:   Active ROM Right eval Left eval  Shoulder flexion 125 88  Shoulder extension    Shoulder abduction 131 106  Shoulder adduction    Shoulder internal rotation To L1 To sacrum  Shoulder external rotation To T2 To T1  Elbow flexion    Elbow extension    Wrist flexion    Wrist extension    Wrist ulnar deviation    Wrist radial deviation    Wrist pronation    Wrist supination    (Blank rows = not tested)  UPPER EXTREMITY MMT:  MMT Right eval Left eval  Shoulder flexion 4-/5 4-/5  Shoulder extension    Shoulder abduction 4/5 4/5; limited by pain   Shoulder adduction    Shoulder internal rotation 4+/5 4/5  Shoulder external rotation 4/5 4/5  Middle trapezius    Lower trapezius    Elbow flexion    Elbow extension    Wrist flexion    Wrist extension     Wrist ulnar deviation    Wrist radial deviation    Wrist pronation    Wrist supination    Grip strength (lbs)    (Blank rows = not tested)  SHOULDER SPECIAL TESTS: Rotator cuff assessment: Drop arm test: negative   JOINT MOBILITY TESTING:  Right AC joint: hypomobile and nonpainful   PALPATION:  TTP: right infraspinatus, supraspinatus, and upper trapezius   TODAY'S TREATMENT:                                                                                                                                         DATE:                                    11/20 EXERCISE LOG  Exercise Repetitions and Resistance Comments  Scapular retraction 10 reps  Limited by left shoulder  Wall slides 10 reps w/ 20 second hold   Isometric shoulder extension 10 reps w/ 5 second hold             Blank cell = exercise not performed today   PATIENT EDUCATION: Education details: HEP, healing, prognosis, and plan of care Person educated: Patient Education method: Explanation Education comprehension: verbalized understanding  HOME EXERCISE PROGRAM: Patient was recommended to complete today's interventions at home as a part of her HEP.  She declined  a handout.  ASSESSMENT:  CLINICAL IMPRESSION: Patient is a 76 y.o. female who was seen today for physical therapy evaluation and treatment for chronic right shoulder pain.  She presented with low pain severity and irritability with palpation to her right rotator cuff being the most aggravating to her familiar symptoms.  She exhibited reduced right shoulder active range of motion and strength, but this was not as severe as the left shoulder.  Recommend that she continue with skilled physical therapy to address her remaining impairments to maximize her functional mobility.  PHYSICAL THERAPY DISCHARGE SUMMARY  Visits from Start of Care: 1  Current functional level related to goals / functional outcomes: Patient failed to return to physical therapy  following her initial evaluation.    Remaining deficits: See evaluation   Education / Equipment: HEP    Patient agrees to discharge. Patient goals were not met. Patient is being discharged due to not returning since the last visit.  Candi Leash, PT, DPT    OBJECTIVE IMPAIRMENTS: decreased ROM, decreased strength, hypomobility, postural dysfunction, and pain.   ACTIVITY LIMITATIONS: carrying, lifting, sleeping, and reach over head  PARTICIPATION LIMITATIONS:  household activities  PERSONAL FACTORS: Time since onset of injury/illness/exacerbation and 3+ comorbidities: Hypertension, osteoarthritis, osteopenia, and chronic left shoulder pain  are also affecting patient's functional outcome.   REHAB POTENTIAL: Fair    CLINICAL DECISION MAKING: Stable/uncomplicated  EVALUATION COMPLEXITY: Low   GOALS: Goals reviewed with patient? Yes  LONG TERM GOALS: Target date: 09/12/22  Patient will be independent with her HEP. Baseline:  Goal status: INITIAL  2.  Patient will be able to complete her daily activities without her right shoulder pain exceeding a 3/10. Baseline:  Goal status: INITIAL  3.  Patient will be able to lift at least 5 pounds overhead with her right upper extremity without being limited by her familiar shoulder pain. Baseline:  Goal status: INITIAL  PLAN:  PT FREQUENCY: 1-2x/week  PT DURATION: 3 weeks  PLANNED INTERVENTIONS: Therapeutic exercises, Therapeutic activity, Neuromuscular re-education, Patient/Family education, Self Care, Joint mobilization, Electrical stimulation, Cryotherapy, Moist heat, Vasopneumatic device, Manual therapy, and Re-evaluation  PLAN FOR NEXT SESSION: Pulleys, wall slides, scapular retraction, rotator cuff strengthening, and modalities as needed   Granville Lewis, PT 08/22/2022, 3:09 PM

## 2022-08-29 ENCOUNTER — Ambulatory Visit: Payer: Medicare Other

## 2022-09-07 ENCOUNTER — Encounter: Payer: Medicare Other | Admitting: Orthopedic Surgery

## 2022-09-09 ENCOUNTER — Ambulatory Visit: Payer: Medicare Other | Admitting: Nurse Practitioner

## 2022-09-12 ENCOUNTER — Ambulatory Visit (INDEPENDENT_AMBULATORY_CARE_PROVIDER_SITE_OTHER): Payer: Medicare Other | Admitting: Nurse Practitioner

## 2022-09-12 ENCOUNTER — Encounter: Payer: Self-pay | Admitting: Nurse Practitioner

## 2022-09-12 VITALS — BP 110/62 | HR 82 | Temp 97.9°F | Resp 20 | Ht 64.0 in | Wt 188.0 lb

## 2022-09-12 DIAGNOSIS — I10 Essential (primary) hypertension: Secondary | ICD-10-CM | POA: Diagnosis not present

## 2022-09-12 DIAGNOSIS — N3281 Overactive bladder: Secondary | ICD-10-CM

## 2022-09-12 DIAGNOSIS — E6609 Other obesity due to excess calories: Secondary | ICD-10-CM

## 2022-09-12 DIAGNOSIS — E7800 Pure hypercholesterolemia, unspecified: Secondary | ICD-10-CM | POA: Diagnosis not present

## 2022-09-12 DIAGNOSIS — R739 Hyperglycemia, unspecified: Secondary | ICD-10-CM | POA: Diagnosis not present

## 2022-09-12 DIAGNOSIS — E66811 Obesity, class 1: Secondary | ICD-10-CM

## 2022-09-12 DIAGNOSIS — E8881 Metabolic syndrome: Secondary | ICD-10-CM | POA: Diagnosis not present

## 2022-09-12 DIAGNOSIS — Z6833 Body mass index (BMI) 33.0-33.9, adult: Secondary | ICD-10-CM

## 2022-09-12 LAB — BAYER DCA HB A1C WAIVED: HB A1C (BAYER DCA - WAIVED): 6.8 % — ABNORMAL HIGH (ref 4.8–5.6)

## 2022-09-12 MED ORDER — ROSUVASTATIN CALCIUM 10 MG PO TABS: 10.0000 mg | ORAL_TABLET | Freq: Every day | ORAL | 1 refills | Status: DC

## 2022-09-12 MED ORDER — VALSARTAN-HYDROCHLOROTHIAZIDE 160-25 MG PO TABS: 0.5000 | ORAL_TABLET | Freq: Every day | ORAL | 1 refills | Status: DC

## 2022-09-12 MED ORDER — MIRABEGRON ER 50 MG PO TB24: 50.0000 mg | ORAL_TABLET | Freq: Every day | ORAL | 1 refills | Status: DC

## 2022-09-12 NOTE — Patient Instructions (Signed)

## 2022-09-12 NOTE — Progress Notes (Signed)
Subjective:    Patient ID: Ariel [name], female    DOB: 1946-09-18, 76 y.o.   MRN: [id number]   Chief Complaint: medical management of chronic issues     HPI:  Ariel [name] is a 76 y.o. who identifies as a female who was assigned female at birth.   Social history: Lives with: husband Work history: retired   Scientist, forensic in today for follow up of the following chronic medical issues:  1. Primary hypertension No c/o chest pain, sob or headache. Does not check blood pressure at home. BP Readings from Last 3 Encounters:  07/28/22 112/60  05/23/22 122/68  05/18/22 103/64     2. Pure hypercholesterolemia Does try to watch diet and does little to no dedicated exercise. Lab Results  Component Value Date   CHOL 157 03/10/2022   HDL 51 03/10/2022   LDLCALC 87 03/10/2022   TRIG 102 03/10/2022   CHOLHDL 3.1 03/10/2022     3. Metabolic syndrome Does not check blood sugars every day. Lab Results  Component Value Date   HGBA1C 6.2 (H) 03/10/2022     4. Overactive bladder Myrbetriq works well to keep symptoms under control  5. Class 1 obesity due to excess calories with serious comorbidity and body mass index (BMI) of 33.0 to 33.9 in adult No recent weight changes Wt Readings from Last 3 Encounters:  09/12/22 188 lb (85.3 kg)  08/18/22 186 lb (84.4 kg)  07/28/22 186 lb (84.4 kg)   BMI Readings from Last 3 Encounters:  09/12/22 32.27 kg/m  08/18/22 31.93 kg/m  07/28/22 31.93 kg/m     New complaints: None today  No Known Allergies Outpatient Encounter Medications as of 09/12/2022  Medication Sig   cephALEXin (KEFLEX) 500 MG capsule Take 1 capsule (500 mg total) by mouth 3 (three) times daily. (Patient not taking: Reported on 08/18/2022)   ibuprofen (ADVIL) 800 MG tablet TAKE 1 TABLET BY MOUTH EVERY 8 HOURS AS NEEDED   mirabegron ER (MYRBETRIQ) 50 MG TB24 tablet Take 1 tablet (50 mg total) by mouth daily.   rosuvastatin (CRESTOR) 10 MG tablet Take 10  mg by mouth daily.   valsartan-hydrochlorothiazide (DIOVAN-HCT) 160-25 MG tablet Take 0.5 tablets by mouth daily.   No facility-administered encounter medications on file as of 09/12/2022.    Past Surgical History:  Procedure Laterality Date   ABDOMINAL HYSTERECTOMY  1985   CARPAL TUNNEL RELEASE Right 2007   CARPAL TUNNEL RELEASE Left 03/17/2016   Procedure: LEFT CARPAL TUNNEL RELEASE;  Surgeon: Carole Civil, MD;  Location: [facility];  Service: Orthopedics;  Laterality: Left;   CLOSED REDUCTION FINGER WITH PERCUTANEOUS PINNING Right 06/20/2019   Procedure: RIGHT SMALL FINGER CLOSED REDUCTION PIN FIXATION;  Surgeon: Leanora Cover, MD;  Location: [facility];  Service: Orthopedics;  Laterality: Right;    Family History  Problem Relation Age of Onset   Diabetes Mother    Cancer Father 47       colon   Alcohol abuse Brother    Cancer Brother        stomach   Lupus Brother       Controlled substance contract: n/a     Review of Systems  Constitutional:  Negative for diaphoresis.  Eyes:  Negative for pain.  Respiratory:  Negative for shortness of breath.   Cardiovascular:  Negative for chest pain, palpitations and leg swelling.  Gastrointestinal:  Negative for abdominal pain.  Endocrine: Negative for polydipsia.  Skin:  Negative for rash.  Neurological:  Negative for dizziness, weakness and headaches.  Hematological:  Does not bruise/bleed easily.  All other systems reviewed and are negative.      Objective:   Physical Exam Vitals and nursing note reviewed.  Constitutional:      General: She is not in acute distress.    Appearance: Normal appearance. She is well-developed.  HENT:     Head: Normocephalic.     Right Ear: Tympanic membrane normal.     Left Ear: Tympanic membrane normal.     Nose: Nose normal.     Mouth/Throat:     Mouth: Mucous membranes are moist.  Eyes:     Pupils: Pupils are equal, round, and reactive to light.  Neck:      Vascular: No carotid bruit or JVD.  Cardiovascular:     Rate and Rhythm: Normal rate and regular rhythm.     Heart sounds: Normal heart sounds.  Pulmonary:     Effort: Pulmonary effort is normal. No respiratory distress.     Breath sounds: Normal breath sounds. No wheezing or rales.  Chest:     Chest wall: No tenderness.  Abdominal:     General: Bowel sounds are normal. There is no distension or abdominal bruit.     Palpations: Abdomen is soft. There is no hepatomegaly, splenomegaly, mass or pulsatile mass.     Tenderness: There is no abdominal tenderness.  Musculoskeletal:        General: Normal range of motion.     Cervical back: Normal range of motion and neck supple.  Lymphadenopathy:     Cervical: No cervical adenopathy.  Skin:    General: Skin is warm and dry.  Neurological:     Mental Status: She is alert and oriented to person, place, and time.     Deep Tendon Reflexes: Reflexes are normal and symmetric.  Psychiatric:        Behavior: Behavior normal.        Thought Content: Thought content normal.        Judgment: Judgment normal.     BP 110/62   Pulse 82   Temp 97.9 F (36.6 C) (Temporal)   Resp 20   Ht _0  (1.626 m)   Wt 188 lb (85.3 kg)   SpO2 96%   BMI 32.27 kg/m   Hgba1c 6.8%     Assessment & Plan:  Ariel [name] comes in today with chief complaint of Medical Management of Chronic Issues   Diagnosis and orders addressed:  1. Primary hypertension Low sodium diet - CBC with Differential/Platelet - CMP14+EGFR - valsartan-hydrochlorothiazide (DIOVAN-HCT) 160-25 MG tablet; Take 0.5 tablets by mouth daily.  Dispense: 90 tablet; Refill: 1  2. Pure hypercholesterolemia Low fat diet - Lipid panel - rosuvastatin (CRESTOR) 10 MG tablet; Take 1 tablet (10 mg total) by mouth daily.  Dispense: 90 tablet; Refill: 1  3. Metabolic syndrome Continue to watch carbs in diet - Bayer DCA Hb A1c Waived - Microalbumin / creatinine urine ratio  4.  Overactive bladder - mirabegron ER (MYRBETRIQ) 50 MG TB24 tablet; Take 1 tablet (50 mg total) by mouth daily.  Dispense: 90 tablet; Refill: 1  5. Class 1 obesity due to excess calories with serious comorbidity and body mass index (BMI) of 33.0 to 33.9 in adult Discussed diet and exercise for person with BMI >25 Will recheck weight in 3-6 months    Labs pending Health Maintenance reviewed Diet and exercise encouraged  Follow up plan: 6 months  Ariel Daryana Whirley, FNP   

## 2022-09-13 LAB — CMP14+EGFR
ALT: 20 IU/L (ref 0–32)
AST: 22 IU/L (ref 0–40)
Albumin/Globulin Ratio: 1.3 (ref 1.2–2.2)
Albumin: 4.1 g/dL (ref 3.8–4.8)
Alkaline Phosphatase: 87 IU/L (ref 44–121)
BUN/Creatinine Ratio: 21 (ref 12–28)
BUN: 13 mg/dL (ref 8–27)
Bilirubin Total: <0.2 mg/dL (ref 0.0–1.2)
CO2: 24 mmol/L (ref 20–29)
Calcium: 9.8 mg/dL (ref 8.7–10.3)
Chloride: 104 mmol/L (ref 96–106)
Creatinine, Ser: 0.61 mg/dL (ref 0.57–1.00)
Globulin, Total: 3.2 g/dL (ref 1.5–4.5)
Glucose: 93 mg/dL (ref 70–99)
Potassium: 4.2 mmol/L (ref 3.5–5.2)
Sodium: 144 mmol/L (ref 134–144)
Total Protein: 7.3 g/dL (ref 6.0–8.5)
eGFR: 93 mL/min/{1.73_m2} (ref >59)

## 2022-09-13 LAB — CBC WITH DIFFERENTIAL/PLATELET
Basophils Absolute: 0.1 10*3/uL (ref 0.0–0.2)
Basos: 1 %
EOS (ABSOLUTE): 0.1 10*3/uL (ref 0.0–0.4)
Eos: 1 %
Hematocrit: 39 % (ref 34.0–46.6)
Hemoglobin: 12.7 g/dL (ref 11.1–15.9)
Immature Grans (Abs): 0 10*3/uL (ref 0.0–0.1)
Immature Granulocytes: 0 %
Lymphocytes Absolute: 2.9 10*3/uL (ref 0.7–3.1)
Lymphs: 41 %
MCH: 28 pg (ref 26.6–33.0)
MCHC: 32.6 g/dL (ref 31.5–35.7)
MCV: 86 fL (ref 79–97)
Monocytes Absolute: 0.8 10*3/uL (ref 0.1–0.9)
Monocytes: 11 %
Neutrophils Absolute: 3.3 10*3/uL (ref 1.4–7.0)
Neutrophils: 46 %
Platelets: 272 10*3/uL (ref 150–450)
RBC: 4.53 x10E6/uL (ref 3.77–5.28)
RDW: 13.3 % (ref 11.7–15.4)
WBC: 7.1 10*3/uL (ref 3.4–10.8)

## 2022-09-13 LAB — MICROALBUMIN / CREATININE URINE RATIO
Creatinine, Urine: 114.2 mg/dL
Microalb/Creat Ratio: 27 mg/g creat (ref 0–29)
Microalbumin, Urine: 30.9 ug/mL

## 2022-09-13 LAB — LIPID PANEL
Chol/HDL Ratio: 3.6 ratio (ref 0.0–4.4)
Cholesterol, Total: 196 mg/dL (ref 100–199)
HDL: 54 mg/dL (ref >39)
LDL Chol Calc (NIH): 84 mg/dL (ref 0–99)
Triglycerides: 362 mg/dL — ABNORMAL HIGH (ref 0–149)
VLDL Cholesterol Cal: 58 mg/dL — ABNORMAL HIGH (ref 5–40)

## 2022-09-14 ENCOUNTER — Ambulatory Visit (INDEPENDENT_AMBULATORY_CARE_PROVIDER_SITE_OTHER): Payer: Medicare Other

## 2022-09-14 ENCOUNTER — Ambulatory Visit (INDEPENDENT_AMBULATORY_CARE_PROVIDER_SITE_OTHER): Payer: Medicare Other | Admitting: Orthopedic Surgery

## 2022-09-14 ENCOUNTER — Encounter: Payer: Self-pay | Admitting: Orthopedic Surgery

## 2022-09-14 VITALS — BP 126/68 | HR 82 | Ht 64.0 in | Wt 185.0 lb

## 2022-09-14 DIAGNOSIS — M1712 Unilateral primary osteoarthritis, left knee: Secondary | ICD-10-CM | POA: Diagnosis not present

## 2022-09-14 DIAGNOSIS — G8929 Other chronic pain: Secondary | ICD-10-CM | POA: Diagnosis not present

## 2022-09-14 DIAGNOSIS — M23322 Other meniscus derangements, posterior horn of medial meniscus, left knee: Secondary | ICD-10-CM | POA: Diagnosis not present

## 2022-09-14 DIAGNOSIS — M25562 Pain in left knee: Secondary | ICD-10-CM | POA: Diagnosis not present

## 2022-09-14 MED ORDER — NAPROXEN 500 MG PO TABS: 500.0000 mg | ORAL_TABLET | Freq: Two times a day (BID) | ORAL | 2 refills | Status: DC

## 2022-09-14 NOTE — Progress Notes (Signed)
Established patient new problem  SUBJECTIVE: Ariel [name] is a 76 y.o. female who started having left knee pain about 2 months ago with no history of injury  She complains primarily of medial knee pain but she is able to weight-bear, not having any swelling.  Knee Musculoskeletal Exam Gait   Antalgic: left  Inspection   Leg length disparity: no discrepancy   Left     Erythema: none       Effusion: none       Edema: none       Ecchymosis: none       Deformity: none       Alignment: normal       Previous incision: no previous incision  Palpation   Right     Increased warmth: none     Masses: none       Tenderness: none     Left     Increased warmth: none       Masses: none       Crepitus: patellofemoral    Range of Motion   Left     Left knee range of motion is normal.    Strength   Left     Extension: 5/5.      Flexion: 5/5.    Instability   Left     Varus stress grade: normal     Valgus stress grade: normal     Anterior drawer: normal     Posterior drawer: normal     Medial McMurray test: positive  Neurovascular   Left     Left knee neurovascular exam is normal.    Special Signs   Left     J sign: none       Patellar apprehension: none    General     Constitutional: well-nourished   Scleral icterus: no   Labored breathing: no   Psychiatric: normal mood and affect   Neurological: alert and oriented x3  X-ray: shows DJD changes, likely chronic.  ASSESSMENT: Knee meniscal injury, internal derangement, and underlying chronic DJD likely  PLAN: prescription for NSAID given  Procedure note left knee injection   verbal consent was obtained to inject left knee joint  Timeout was completed to confirm the site of injection  The medications used were depomedrol 40 mg and 1% lidocaine 3 cc Anesthesia was provided by ethyl chloride and the skin was prepped with alcohol.  After cleaning the skin with alcohol a 20-gauge needle was used to inject  the left knee joint. There were no complications. A sterile bandage was applied.   Meds ordered this encounter  Medications   naproxen (NAPROSYN) 500 MG tablet    Sig: Take 1 tablet (500 mg total) by mouth 2 (two) times daily with a meal.    Dispense:  60 tablet    Refill:  2     Return in 6 weeks  See orders for this visit as documented in the electronic medical record.

## 2022-09-14 NOTE — Patient Instructions (Signed)

## 2022-10-25 ENCOUNTER — Other Ambulatory Visit: Payer: Self-pay | Admitting: Nurse Practitioner

## 2022-10-25 DIAGNOSIS — Z1231 Encounter for screening mammogram for malignant neoplasm of breast: Secondary | ICD-10-CM

## 2022-10-26 ENCOUNTER — Ambulatory Visit (INDEPENDENT_AMBULATORY_CARE_PROVIDER_SITE_OTHER): Payer: 59 | Admitting: Orthopedic Surgery

## 2022-10-26 ENCOUNTER — Encounter: Payer: Self-pay | Admitting: Orthopedic Surgery

## 2022-10-26 DIAGNOSIS — M23322 Other meniscus derangements, posterior horn of medial meniscus, left knee: Secondary | ICD-10-CM

## 2022-10-26 DIAGNOSIS — G8929 Other chronic pain: Secondary | ICD-10-CM

## 2022-10-26 DIAGNOSIS — M1712 Unilateral primary osteoarthritis, left knee: Secondary | ICD-10-CM

## 2022-10-26 NOTE — Progress Notes (Signed)
Chief Complaint  Patient presents with   Post-op Follow-up    Left knee better but still has pain    77 year old female with left knee pain from osteoarthritis thought to be secondary to degenerative changes.    She was given an injection started on naproxen seems to be better but still having some symptoms.  She also took extra Tylenol without relief    Physical Exam Vitals and nursing note reviewed.  Constitutional:      Appearance: Normal appearance.  HENT:     Head: Normocephalic and atraumatic.  Eyes:     General: No scleral icterus.       Right eye: No discharge.        Left eye: No discharge.     Extraocular Movements: Extraocular movements intact.     Conjunctiva/sclera: Conjunctivae normal.     Pupils: Pupils are equal, round, and reactive to light.  Cardiovascular:     Rate and Rhythm: Normal rate.     Pulses: Normal pulses.  Musculoskeletal:     Comments: Left knee full range of motion small effusion   Medial meniscal pathology includes medial joint line tenderness  McMurray's sign  Ligament are stable  Skin:    General: Skin is warm and dry.     Capillary Refill: Capillary refill takes less than 2 seconds.  Neurological:     General: No focal deficit present.     Mental Status: She is alert and oriented to person, place, and time.  Psychiatric:        Mood and Affect: Mood normal.        Behavior: Behavior normal.        Thought Content: Thought content normal.        Judgment: Judgment normal.    Encounter Diagnoses  Name Primary?   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

## 2022-10-26 NOTE — Patient Instructions (Signed)
While we are working on your approval for MRI please go ahead and call to schedule your appointment with [facility] within at least one (1) week.   Central Scheduling [phone]  

## 2022-10-31 ENCOUNTER — Inpatient Hospital Stay: Admission: RE | Admit: 2022-10-31 | Payer: Medicare Other | Source: Ambulatory Visit

## 2022-11-07 ENCOUNTER — Ambulatory Visit
Admission: RE | Admit: 2022-11-07 | Discharge: 2022-11-07 | Disposition: A | Discharge status: Home / Self Care | Payer: 59 | Source: Ambulatory Visit | Attending: Nurse Practitioner | Admitting: Nurse Practitioner

## 2022-11-07 DIAGNOSIS — Z1231 Encounter for screening mammogram for malignant neoplasm of breast: Secondary | ICD-10-CM | POA: Diagnosis not present

## 2022-11-15 ENCOUNTER — Ambulatory Visit (HOSPITAL_COMMUNITY)
Admission: RE | Admit: 2022-11-15 | Discharge: 2022-11-15 | Disposition: A | Discharge status: Home / Self Care | Payer: 59 | Source: Ambulatory Visit | Attending: Orthopedic Surgery | Admitting: Orthopedic Surgery

## 2022-11-15 DIAGNOSIS — M25562 Pain in left knee: Secondary | ICD-10-CM | POA: Insufficient documentation

## 2022-11-15 DIAGNOSIS — M23322 Other meniscus derangements, posterior horn of medial meniscus, left knee: Secondary | ICD-10-CM | POA: Diagnosis not present

## 2022-11-15 DIAGNOSIS — G8929 Other chronic pain: Secondary | ICD-10-CM | POA: Diagnosis not present

## 2022-11-15 DIAGNOSIS — M1712 Unilateral primary osteoarthritis, left knee: Secondary | ICD-10-CM

## 2022-11-24 ENCOUNTER — Encounter: Payer: Self-pay | Admitting: Orthopedic Surgery

## 2022-11-24 ENCOUNTER — Ambulatory Visit (INDEPENDENT_AMBULATORY_CARE_PROVIDER_SITE_OTHER): Payer: 59 | Admitting: Orthopedic Surgery

## 2022-11-24 DIAGNOSIS — Z01818 Encounter for other preprocedural examination: Secondary | ICD-10-CM | POA: Diagnosis not present

## 2022-11-24 DIAGNOSIS — M23322 Other meniscus derangements, posterior horn of medial meniscus, left knee: Secondary | ICD-10-CM | POA: Diagnosis not present

## 2022-11-24 NOTE — Patient Instructions (Signed)
Your surgery will be at [place] by Dr Harrison  The hospital will contact you with a preoperative appointment to discuss Anesthesia.  Please arrive on time or 15 minutes early for the preoperative appointment, they have a very tight schedule if you are late or do not come in your surgery will be cancelled.  The phone number is [phone]. Please bring your medications with you for the appointment. They will tell you the arrival time and medication instructions when you have your preoperative evaluation. Do not wear nail polish the day of your surgery and if you take Phentermine you need to stop this medication ONE WEEK prior to your surgery. If you take Invokana, Farxiga, Jardiance, or Steglatro) - Hold 72 hours before the procedure.  If you take Ozempic,  Bydureon or Trulicity do not take for 8 days before your surgery. If you take Victoza, Rybelsis, Saxenda or Adlyxi stop 24 hours before the procedure.  Please arrive at the hospital 2 hours before procedure if scheduled at 9:30 or later in the day or at the time the nurse tells you at your preoperative visit.   If you have my chart do not use the time given in my chart use the time given to you by the nurse during your preoperative visit.   Your surgery  time may change. Please be available for phone calls the day of your surgery and the day before. The Short Stay department may need to discuss changes about your surgery time. Not reaching the you could lead to procedure delays and possible cancellation.  You must have a ride home and someone to stay with you for 24 to 48 hours. The person taking you home will receive and sign for the your discharge instructions.  Please be prepared to give your support person's name and telephone number to [facility] [employer]. Dr Harrison will need that name and phone number post procedure.   

## 2022-11-24 NOTE — Progress Notes (Signed)
Recheck knee with MRI  Encounter Diagnoses  Name Primary?   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  She had an injection she had some naproxen she had Tylenol we have been following this for several months.  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 Alternative treatment has also been reviewed, questions were encouraged and answered. The postoperative plan is also been reviewed.  Recommend arthroscopy left knee partial medial meniscectomy

## 2022-11-29 ENCOUNTER — Telehealth: Payer: Self-pay | Admitting: Orthopedic Surgery

## 2022-11-29 DIAGNOSIS — M1712 Unilateral primary osteoarthritis, left knee: Secondary | ICD-10-CM

## 2022-11-29 NOTE — Telephone Encounter (Signed)
Sent via fax

## 2022-11-29 NOTE — Telephone Encounter (Signed)
Patient is requesting a script for a walker to be sent to [facility] in [place], [phone].  Pt's # [phone]

## 2022-12-01 ENCOUNTER — Encounter: Payer: Self-pay | Admitting: Radiology

## 2022-12-01 ENCOUNTER — Telehealth: Payer: Self-pay | Admitting: Orthopedic Surgery

## 2022-12-01 DIAGNOSIS — M1712 Unilateral primary osteoarthritis, left knee: Secondary | ICD-10-CM

## 2022-12-01 NOTE — Telephone Encounter (Signed)
I have advised Dr Aline Brochure and Forestine Na

## 2022-12-01 NOTE — Telephone Encounter (Signed)
Patient called and is requesting a brace for her left [facility] to [facility] in [facility].

## 2022-12-01 NOTE — Telephone Encounter (Signed)
Ok

## 2022-12-01 NOTE — Telephone Encounter (Signed)
Spoke w/the patient, she has a family emergency and is having to go out of town.  She wants her pre-op for 3/01 and surgery cancelled.  She is not sure when she can reschedule.  Pt's # [phone]

## 2022-12-01 NOTE — Patient Instructions (Signed)
How to Use Chlorhexidine Before Surgery Chlorhexidine gluconate (CHG) is a germ-killing (antiseptic) solution that is used to clean the skin. It can get rid of the bacteria that normally live on the skin and can keep them away for about 24 hours. To clean your skin with CHG, you may be given: A CHG solution to use in the shower or as part of a sponge bath. A prepackaged cloth that contains CHG. Cleaning your skin with CHG may help lower the risk for infection: While you are staying in the intensive care unit of the hospital. If you have a vascular access, such as a central line, to provide short-term or long-term access to your veins. If you have a catheter to drain urine from your bladder. If you are on a ventilator. A ventilator is a machine that helps you breathe by moving air in and out of your lungs. After surgery. What are the risks? Risks of using CHG include: A skin reaction. Hearing loss, if CHG gets in your ears and you have a perforated eardrum. Eye injury, if CHG gets in your eyes and is not rinsed out. The CHG product catching fire. Make sure that you avoid smoking and flames after applying CHG to your skin. Do not use CHG: If you have a chlorhexidine allergy or have previously reacted to chlorhexidine. On babies younger than 41 months of age. How to use CHG solution Use CHG only as told by your health care provider, and follow the instructions on the label. Use the full amount of CHG as directed. Usually, this is one bottle. During a shower Follow these steps when using CHG solution during a shower (unless your health care provider gives you different instructions): Start the shower. Use your normal soap and shampoo to wash your face and hair. Turn off the shower or move out of the shower stream. Pour the CHG onto a clean washcloth. Do not use any type of brush or rough-edged sponge. Starting at your neck, lather your body down to your toes. Make sure you follow these  instructions: If you will be having surgery, pay special attention to the part of your body where you will be having surgery. Scrub this area for at least 1 minute. Do not use CHG on your head or face. If the solution gets into your ears or eyes, rinse them well with water. Avoid your genital area. Avoid any areas of skin that have broken skin, cuts, or scrapes. Scrub your back and under your arms. Make sure to wash skin folds. Let the lather sit on your skin for 1-2 minutes or as long as told by your health care provider. Thoroughly rinse your entire body in the shower. Make sure that all body creases and crevices are rinsed well. Dry off with a clean towel. Do not put any substances on your body afterward--such as powder, lotion, or perfume--unless you are told to do so by your health care provider. Only use lotions that are recommended by the manufacturer. Put on clean clothes or pajamas. If it is the night before your surgery, sleep in clean sheets.  During a sponge bath Follow these steps when using CHG solution during a sponge bath (unless your health care provider gives you different instructions): Use your normal soap and shampoo to wash your face and hair. Pour the CHG onto a clean washcloth. Starting at your neck, lather your body down to your toes. Make sure you follow these instructions: If you will be having  surgery, pay special attention to the part of your body where you will be having surgery. Scrub this area for at least 1 minute. Do not use CHG on your head or face. If the solution gets into your ears or eyes, rinse them well with water. Avoid your genital area. Avoid any areas of skin that have broken skin, cuts, or scrapes. Scrub your back and under your arms. Make sure to wash skin folds. Let the lather sit on your skin for 1-2 minutes or as long as told by your health care provider. Using a different clean, wet washcloth, thoroughly rinse your entire body. Make sure that  all body creases and crevices are rinsed well. Dry off with a clean towel. Do not put any substances on your body afterward--such as powder, lotion, or perfume--unless you are told to do so by your health care provider. Only use lotions that are recommended by the manufacturer. Put on clean clothes or pajamas. If it is the night before your surgery, sleep in clean sheets. How to use CHG prepackaged cloths Only use CHG cloths as told by your health care provider, and follow the instructions on the label. Use the CHG cloth on clean, dry skin. Do not use the CHG cloth on your head or face unless your health care provider tells you to. When washing with the CHG cloth: Avoid your genital area. Avoid any areas of skin that have broken skin, cuts, or scrapes. Before surgery Follow these steps when using a CHG cloth to clean before surgery (unless your health care provider gives you different instructions): Using the CHG cloth, vigorously scrub the part of your body where you will be having surgery. Scrub using a back-and-forth motion for 3 minutes. The area on your body should be completely wet with CHG when you are done scrubbing. Do not rinse. Discard the cloth and let the area air-dry. Do not put any substances on the area afterward, such as powder, lotion, or perfume. Put on clean clothes or pajamas. If it is the night before your surgery, sleep in clean sheets.  For general bathing Follow these steps when using CHG cloths for general bathing (unless your health care provider gives you different instructions). Use a separate CHG cloth for each area of your body. Make sure you wash between any folds of skin and between your fingers and toes. Wash your body in the following order, switching to a new cloth after each step: The front of your neck, shoulders, and chest. Both of your arms, under your arms, and your hands. Your stomach and groin area, avoiding the genitals. Your right leg and  foot. Your left leg and foot. The back of your neck, your back, and your buttocks. Do not rinse. Discard the cloth and let the area air-dry. Do not put any substances on your body afterward--such as powder, lotion, or perfume--unless you are told to do so by your health care provider. Only use lotions that are recommended by the manufacturer. Put on clean clothes or pajamas. Contact a health care provider if: Your skin gets irritated after scrubbing. You have questions about using your solution or cloth. You swallow any chlorhexidine. Call your local poison control center (1-(949) 859-0532 in the [place] Get help right away if: Your eyes itch badly, or they become very red or swollen. Your skin itches badly and is red or swollen. Your hearing changes. You have trouble seeing. You have swelling or tingling in your mouth or throat. You have trouble  breathing. These symptoms may represent a serious problem that is an emergency. Do not wait to see if the symptoms will go away. Get medical help right away. Call your local emergency services (911 in the [place] Do not drive yourself to the hospital. Summary Chlorhexidine gluconate (CHG) is a germ-killing (antiseptic) solution that is used to clean the skin. Cleaning your skin with CHG may help to lower your risk for infection. You may be given CHG to use for bathing. It may be in a bottle or in a prepackaged cloth to use on your skin. Carefully follow your health care provider's instructions and the instructions on the product label. Do not use CHG if you have a chlorhexidine allergy. Contact your health care provider if your skin gets irritated after scrubbing. This information is not intended to replace advice given to you by your health care provider. Make sure you discuss any questions you have with your health care provider. Document Revised: 01/17/2022 Document Reviewed: 11/30/2020 Elsevier Patient Education  [employer].

## 2022-12-01 NOTE — Telephone Encounter (Signed)
faxed

## 2022-12-02 ENCOUNTER — Encounter (HOSPITAL_COMMUNITY)
Admission: RE | Admit: 2022-12-02 | Discharge: 2022-12-02 | Disposition: A | Discharge status: Home / Self Care | Payer: 59 | Source: Ambulatory Visit | Attending: Orthopedic Surgery | Admitting: Orthopedic Surgery

## 2022-12-02 DIAGNOSIS — I10 Essential (primary) hypertension: Secondary | ICD-10-CM

## 2022-12-05 ENCOUNTER — Telehealth: Payer: Self-pay

## 2022-12-05 NOTE — Telephone Encounter (Signed)
Called patient yes, it was sent. There is a note of this in epic, but I called her back to let her know.

## 2022-12-05 NOTE — Telephone Encounter (Signed)
Patient called asking if the order was ever sent to her pharmacy [facility] for her L Knee Brace.  Please call to advise

## 2022-12-06 ENCOUNTER — Ambulatory Visit: Admit: 2022-12-06 | Payer: 59 | Admitting: Orthopedic Surgery

## 2022-12-06 SURGERY — ARTHROSCOPY, KNEE, WITH MEDIAL MENISCECTOMY
Anesthesia: Choice | Site: Knee | Laterality: Left

## 2022-12-19 ENCOUNTER — Telehealth: Payer: Self-pay | Admitting: Orthopedic Surgery

## 2022-12-19 NOTE — Telephone Encounter (Signed)
Patient called, stated that her pharmacy still does not have the lt knee brace script.  [facility].  Pt's # [phone]

## 2022-12-19 NOTE — Telephone Encounter (Signed)
I faxed again.

## 2023-02-02 ENCOUNTER — Ambulatory Visit (INDEPENDENT_AMBULATORY_CARE_PROVIDER_SITE_OTHER): Payer: 59 | Admitting: Nurse Practitioner

## 2023-02-02 ENCOUNTER — Encounter: Payer: Self-pay | Admitting: Nurse Practitioner

## 2023-02-02 ENCOUNTER — Ambulatory Visit (INDEPENDENT_AMBULATORY_CARE_PROVIDER_SITE_OTHER): Payer: 59

## 2023-02-02 VITALS — BP 106/63 | HR 79 | Temp 97.6°F | Resp 20 | Ht 64.0 in | Wt 186.0 lb

## 2023-02-02 DIAGNOSIS — R079 Chest pain, unspecified: Secondary | ICD-10-CM | POA: Diagnosis not present

## 2023-02-02 DIAGNOSIS — R0789 Other chest pain: Secondary | ICD-10-CM

## 2023-02-02 MED ORDER — METHYLPREDNISOLONE ACETATE 80 MG/ML IJ SUSP
80.0000 mg | Freq: Once | INTRAMUSCULAR | Status: AC
  Administered 2023-02-02: 80 mg via INTRAMUSCULAR

## 2023-02-02 MED ORDER — KETOROLAC TROMETHAMINE 60 MG/2ML IM SOLN
60.0000 mg | Freq: Once | INTRAMUSCULAR | Status: AC
  Administered 2023-02-02: 60 mg via INTRAMUSCULAR

## 2023-02-02 NOTE — Progress Notes (Signed)
   Subjective:    Patient ID: Ariel [name], female    DOB: 07-14-1946, 77 y.o.   MRN: [id number]   Chief Complaint: Hip Pain and Flank Pain (Fell yesterday on cement)   Patient was pumping gas and tripped over gas pump. She landed on her left side on pavement. Now having left hip pain with knot. Slight  pain with walking. Her main complaint is mid sternal pain. She denies hitting her chest. Pain is mainly when she is moving.    Patient Active Problem List   Diagnosis Date Noted   Overactive bladder 07/03/2018   Carpal tunnel syndrome, left    HTN (hypertension) 03/20/2013   HLD (hyperlipidemia) 03/20/2013   Metabolic syndrome 03/20/2013   Obesity 03/20/2013       Review of Systems  Constitutional:  Negative for diaphoresis.  Eyes:  Negative for pain.  Respiratory:  Negative for shortness of breath.   Cardiovascular:  Negative for chest pain, palpitations and leg swelling.  Gastrointestinal:  Negative for abdominal pain.  Endocrine: Negative for polydipsia.  Skin:  Negative for rash.  Neurological:  Negative for dizziness, weakness and headaches.  Hematological:  Does not bruise/bleed easily.  All other systems reviewed and are negative.      Objective:   Physical Exam Constitutional:      Appearance: Normal appearance.  Cardiovascular:     Rate and Rhythm: Normal rate and regular rhythm.     Heart sounds: Normal heart sounds.     Comments: Chest wall pain on palpation Pulmonary:     Effort: Pulmonary effort is normal.     Breath sounds: Normal breath sounds.  Skin:    General: Skin is warm.     Comments: Contusion on left hip  Neurological:     General: No focal deficit present.     Mental Status: She is alert and oriented to person, place, and time.  Psychiatric:        Mood and Affect: Mood normal.        Behavior: Behavior normal.    BP 106/63   Pulse 79   Temp 97.6 F (36.4 C) (Temporal)   Resp 20   Ht 5\' 4"  (1.626 m)   Wt 186 lb (84.4 kg)    SpO2 100%   BMI 31.93 kg/m   Chest xray - normal-Mary-Margaret Daphine Deutscher, FNP       Assessment & Plan:  Ariel [name] in today with chief complaint of Hip Pain and Flank Pain (Fell yesterday on cement)   1. Mid sternal chest pain Ice to chest wall rest - DG Chest 2 View - methylPREDNISolone acetate (DEPO-MEDROL) injection 80 mg - ketorolac (TORADOL) injection 60 mg    The above assessment and management plan was discussed with the patient. The patient verbalized understanding of and has agreed to the management plan. Patient is aware to call the clinic if symptoms persist or worsen. Patient is aware when to return to the clinic for a follow-up visit. Patient educated on when it is appropriate to go to the emergency department.   Mary-Margaret Daphine Deutscher, FNP

## 2023-02-14 ENCOUNTER — Ambulatory Visit (INDEPENDENT_AMBULATORY_CARE_PROVIDER_SITE_OTHER): Payer: 59

## 2023-02-14 VITALS — Ht 64.0 in | Wt 186.0 lb

## 2023-02-14 DIAGNOSIS — Z0000 Encounter for general adult medical examination without abnormal findings: Secondary | ICD-10-CM

## 2023-02-14 DIAGNOSIS — Z780 Asymptomatic menopausal state: Secondary | ICD-10-CM | POA: Diagnosis not present

## 2023-02-14 NOTE — Progress Notes (Signed)
Subjective:   Ariel [name] is a 77 y.o. female who presents for Medicare Annual (Subsequent) preventive examination. I connected with  Ariel [name] on 02/14/23 by a audio enabled telemedicine application and verified that I am speaking with the correct person using two identifiers.  Patient Location: Home  Provider Location: Home Office  I discussed the limitations of evaluation and management by telemedicine. The patient expressed understanding and agreed to proceed.  Review of Systems     Cardiac Risk Factors include: advanced age (>15men, >42 women);dyslipidemia     Objective:    Today's Vitals   02/14/23 1033  Weight: 186 lb (84.4 kg)  Height: 5\' 4"  (1.626 m)   Body mass index is 31.93 kg/m.     02/14/2023   10:36 AM 03/15/2022    6:50 PM 02/11/2022   10:07 AM 02/10/2021   12:36 PM 06/20/2019    9:27 AM 06/14/2019   11:13 AM 05/22/2019    3:34 PM  Advanced Directives  Does Patient Have a Medical Advance Directive? No Yes No No Yes No No  Type of Merchandiser, retail of [place];Living will    Does patient want to make changes to medical advance directive?     No - Patient declined    Copy of Healthcare Power of Attorney in Chart?     No - copy requested    Would patient like information on creating a medical advance directive? No - Patient declined  No - Patient declined No - Patient declined  No - Patient declined Yes (MAU/Ambulatory/Procedural Areas - Information given)    Current Medications (verified) Outpatient Encounter Medications as of 02/14/2023  Medication Sig   mirabegron ER (MYRBETRIQ) 50 MG TB24 tablet Take 1 tablet (50 mg total) by mouth daily.   naproxen (NAPROSYN) 500 MG tablet Take 1 tablet (500 mg total) by mouth 2 (two) times daily with a meal.   rosuvastatin (CRESTOR) 10 MG tablet Take 1 tablet (10 mg total) by mouth daily.   valsartan-hydrochlorothiazide (DIOVAN-HCT) 160-25 MG tablet Take 0.5 tablets by mouth daily.    No facility-administered encounter medications on file as of 02/14/2023.    Allergies (verified) Patient has no known allergies.   History: Past Medical History:  Diagnosis Date   Arthritis    Diverticulosis    Hyperlipidemia    Hypertension    Osteopenia    Past Surgical History:  Procedure Laterality Date   ABDOMINAL HYSTERECTOMY  1985   CARPAL TUNNEL RELEASE Right 2007   CARPAL TUNNEL RELEASE Left 03/17/2016   Procedure: LEFT CARPAL TUNNEL RELEASE;  Surgeon: Vickki Hearing, MD;  Location: [facility];  Service: Orthopedics;  Laterality: Left;   CLOSED REDUCTION FINGER WITH PERCUTANEOUS PINNING Right 06/20/2019   Procedure: RIGHT SMALL FINGER CLOSED REDUCTION PIN FIXATION;  Surgeon: Betha Loa, MD;  Location: [facility];  Service: Orthopedics;  Laterality: Right;   Family History  Problem Relation Age of Onset   Diabetes Mother    Cancer Father 89       colon   Alcohol abuse Brother    Cancer Brother        stomach   Lupus Brother    Breast cancer Neg Hx    Social History   Socioeconomic History   Marital status: Married    Spouse name: Debby Bud   Number of children: 1   Years of education: 12   Highest education level: 12th grade  Occupational History  Occupation: Retired    Comment: Retired from the [employer]  Tobacco Use   Smoking status: Never   Smokeless tobacco: Never  Vaping Use   Vaping Use: Never used  Substance and Sexual Activity   Alcohol use: Yes    Comment: social   Drug use: No   Sexual activity: Yes    Birth control/protection: Surgical  Other Topics Concern   Not on file  Social History Narrative   Lives with new husband  - recently re-married 01/2021. One level living   Social Determinants of Health   Financial Resource Strain: Low Risk  (02/14/2023)   Overall Financial Resource Strain (CARDIA)    Difficulty of Paying Living Expenses: Not hard at all  Food Insecurity: No Food Insecurity (02/14/2023)   Hunger Vital Sign     Worried About Running Out of Food in the Last Year: Never true    Ran Out of Food in the Last Year: Never true  Transportation Needs: No Transportation Needs (02/14/2023)   PRAPARE - Administrator, Civil Service (Medical): No    Lack of Transportation (Non-Medical): No  Physical Activity: Insufficiently Active (02/14/2023)   Exercise Vital Sign    Days of Exercise per Week: 3 days    Minutes of Exercise per Session: 30 min  Stress: No Stress Concern Present (02/14/2023)   [employer] of Occupational Health - Occupational Stress Questionnaire    Feeling of Stress : Not at all  Social Connections: Moderately Integrated (02/14/2023)   Social Connection and Isolation Panel [NHANES]    Frequency of Communication with Friends and Family: More than three times a week    Frequency of Social Gatherings with Friends and Family: More than three times a week    Attends Religious Services: More than 4 times per year    Active Member of [employer] or Organizations: No    Attends Engineer, structural: Never    Marital Status: Married    Tobacco Counseling Counseling given: Not Answered   Clinical Intake:  Pre-visit preparation completed: Yes  Pain : No/denies pain     Nutritional Risks: None Diabetes: No  How often do you need to have someone help you when you read instructions, pamphlets, or other written materials from your doctor or pharmacy?: 1 - Never  Diabetic?no   Interpreter Needed?: No  Information entered by :: Renie Ora, LPN   Activities of Daily Living    02/14/2023   10:36 AM  In your present state of health, do you have any difficulty performing the following activities:  Hearing? 0  Vision? 0  Difficulty concentrating or making decisions? 0  Walking or climbing stairs? 0  Dressing or bathing? 0  Doing errands, shopping? 0  Preparing Food and eating ? N  Using the Toilet? N  In the past six months, have you accidently leaked urine? N  Do  you have problems with loss of bowel control? N  Managing your Medications? N  Managing your Finances? N  Housekeeping or managing your Housekeeping? N    Patient Care Team: Bennie Pierini, FNP as PCP - General (Nurse Practitioner) Vickki Hearing, MD as Consulting Physician (Orthopedic Surgery)  Indicate any recent Medical Services you may have received from other than [facility] providers in the past year (date may be approximate).     Assessment:   This is a routine wellness examination for Jahne.  Hearing/Vision screen Vision Screening - Comments:: Wears rx glasses - up to date with  routine eye exams with  Dr.johnson   Dietary issues and exercise activities discussed: Current Exercise Habits: Home exercise routine, Type of exercise: walking, Time (Minutes): 30, Frequency (Times/Week): 3, Weekly Exercise (Minutes/Week): 90, Intensity: Mild, Exercise limited by: None identified   Goals Addressed             This Visit's Progress    DIET - EAT MORE FRUITS AND VEGETABLES   On track      Depression Screen    02/14/2023   10:35 AM 02/02/2023   11:09 AM 09/12/2022    2:32 PM 07/28/2022    8:58 AM 05/23/2022    2:05 PM 05/18/2022    2:18 PM 03/10/2022    2:52 PM  PHQ 2/9 Scores  PHQ - 2 Score 0 0 0 0 0 0 0  PHQ- 9 Score 0 0 0 0 0  0    Fall Risk    02/14/2023   10:34 AM 02/02/2023   11:08 AM 09/12/2022    2:32 PM 07/28/2022    8:58 AM 05/23/2022    2:05 PM  Fall Risk   Falls in the past year? 1 1 0 0 0  Number falls in past yr: 1 0     Injury with Fall? 1 1     Risk for fall due to : History of fall(s);Impaired balance/gait;Orthopedic patient History of fall(s)     Follow up Education provided;Falls prevention discussed Education provided       FALL RISK PREVENTION PERTAINING TO THE HOME:  Any stairs in or around the home? No  If so, are there any without handrails? No  Home free of loose throw rugs in walkways, pet beds, electrical cords, etc? Yes   Adequate lighting in your home to reduce risk of falls? Yes   ASSISTIVE DEVICES UTILIZED TO PREVENT FALLS:  Life alert? No  Use of a cane, walker or w/c? No  Grab bars in the bathroom? No  Shower chair or bench in shower? No  Elevated toilet seat or a handicapped toilet? No       05/11/2018   10:36 AM 10/13/2016    1:50 PM  MMSE - Mini Mental State Exam  Orientation to time 5 5  Orientation to Place 5 4  Registration 3 3  Attention/ Calculation 5 5  Recall 3 3  Language- name 2 objects 2 2  Language- repeat 1 1  Language- follow 3 step command 3 3  Language- read & follow direction 1 0  Write a sentence 1 1  Copy design 1 0  Total score 30 27        02/14/2023   10:36 AM 02/11/2022   10:08 AM 05/22/2019    3:35 PM  6CIT Screen  What Year? 0 points 0 points 0 points  What month? 0 points 0 points 0 points  What time? 0 points 0 points 0 points  Count back from 20 0 points 0 points 0 points  Months in reverse 0 points 2 points 0 points  Repeat phrase 0 points 2 points 0 points  Total Score 0 points 4 points 0 points    Immunizations Immunization History  Administered Date(s) Administered   Influenza Whole 08/03/2012   Influenza, High Dose Seasonal PF 07/03/2018   Moderna Sars-Covid-2 Vaccination 12/03/2019, 12/31/2019, 08/04/2020, 06/22/2021   Pneumococcal Conjugate-13 12/08/2014   Pneumococcal Polysaccharide-23 05/18/2016   Tdap 10/13/2016   Zoster, Live 04/29/2014    TDAP status: Up to date  Flu Vaccine  status: Declined, Education has been provided regarding the importance of this vaccine but patient still declined. Advised may receive this vaccine at local pharmacy or Health Dept. Aware to provide a copy of the vaccination record if obtained from local pharmacy or Health Dept. Verbalized acceptance and understanding.  Pneumococcal vaccine status: Up to date  Covid-19 vaccine status: Completed vaccines  Qualifies for Shingles Vaccine? Yes   Zostavax  completed No   Shingrix Completed?: No.    Education has been provided regarding the importance of this vaccine. Patient has been advised to call insurance company to determine out of pocket expense if they have not yet received this vaccine. Advised may also receive vaccine at local pharmacy or Health Dept. Verbalized acceptance and understanding.  Screening Tests Health Maintenance  Topic Date Due   Zoster Vaccines- Shingrix (1 of 2) Never done   COVID-19 Vaccine (5 - 2023-24 season) 02/18/2023 (Originally 06/03/2022)   DEXA SCAN  02/17/2023   INFLUENZA VACCINE  05/04/2023   MAMMOGRAM  11/08/2023   Medicare Annual Wellness (AWV)  02/14/2024   DTaP/Tdap/Td (2 - Td or Tdap) 10/13/2026   Pneumonia Vaccine 19+ Years old  Completed   Hepatitis C Screening  Completed   HPV VACCINES  Aged Out   COLONOSCOPY (Pts 45-27yrs Insurance coverage will need to be confirmed)  Discontinued    Health Maintenance  Health Maintenance Due  Topic Date Due   Zoster Vaccines- Shingrix (1 of 2) Never done    Colorectal cancer screening: No longer required.   Mammogram status: Completed 11/07/2022. Repeat every year  Bone Density status: Ordered 02/14/2023. Pt provided with contact info and advised to call to schedule appt.  Lung Cancer Screening: (Low Dose CT Chest recommended if Age 69-80 years, 30 pack-year currently smoking OR have quit w/in 15years.) does not qualify.   Lung Cancer Screening Referral: n/a  Additional Screening:  Hepatitis C Screening: does not qualify; Completed 10/22/2015  Vision Screening: Recommended annual ophthalmology exams for early detection of glaucoma and other disorders of the eye. Is the patient up to date with their annual eye exam?  Yes  Who is the provider or what is the name of the office in which the patient attends annual eye exams? Dr.Johnson  If pt is not established with a provider, would they like to be referred to a provider to establish care? No .    Dental Screening: Recommended annual dental exams for proper oral hygiene  Community Resource Referral / Chronic Care Management: CRR required this visit?  No   CCM required this visit?  No      Plan:     I have personally reviewed and noted the following in the patient's chart:   Medical and social history Use of alcohol, tobacco or illicit drugs  Current medications and supplements including opioid prescriptions. Patient is not currently taking opioid prescriptions. Functional ability and status Nutritional status Physical activity Advanced directives List of other physicians Hospitalizations, surgeries, and ER visits in previous 12 months Vitals Screenings to include cognitive, depression, and falls Referrals and appointments  In addition, I have reviewed and discussed with patient certain preventive protocols, quality metrics, and best practice recommendations. A written personalized care plan for preventive services as well as general preventive health recommendations were provided to patient.     Lorrene Reid, LPN   [id number]   Nurse Notes: none

## 2023-02-14 NOTE — Patient Instructions (Signed)
Ariel [name] , Thank you for taking time to come for your Medicare Wellness Visit. I appreciate your ongoing commitment to your health goals. Please review the following plan we discussed and let me know if I can assist you in the future.   These are the goals we discussed:  Goals      DIET - EAT MORE FRUITS AND VEGETABLES     Exercise 3x per week (30 min per time)     Try to walk for 30 minutes 3 times per week        This is a list of the screening recommended for you and due dates:  Health Maintenance  Topic Date Due   Zoster (Shingles) Vaccine (1 of 2) Never done   COVID-19 Vaccine (5 - 2023-24 season) 02/18/2023*   DEXA scan (bone density measurement)  02/17/2023   Flu Shot  05/04/2023   Mammogram  11/08/2023   Medicare Annual Wellness Visit  02/14/2024   DTaP/Tdap/Td vaccine (2 - Td or Tdap) 10/13/2026   Pneumonia Vaccine  Completed   Hepatitis C Screening: USPSTF Recommendation to screen - Ages 64-79 yo.  Completed   HPV Vaccine  Aged Out   Colon Cancer Screening  Discontinued  *Topic was postponed. The date shown is not the original due date.    Advanced directives: Advance directive discussed with you today. I have provided a copy for you to complete at home and have notarized. Once this is complete please bring a copy in to our office so we can scan it into your chart.   Conditions/risks identified: Aim for 30 minutes of exercise or brisk walking, 6-8 glasses of water, and 5 servings of fruits and vegetables each day.   Next appointment: Follow up in one year for your annual wellness visit    Preventive Care 65 Years and Older, Female Preventive care refers to lifestyle choices and visits with your health care provider that can promote health and wellness. What does preventive care include? A yearly physical exam. This is also called an annual well check. Dental exams once or twice a year. Routine eye exams. Ask your health care provider how often you should  have your eyes checked. Personal lifestyle choices, including: Daily care of your teeth and gums. Regular physical activity. Eating a healthy diet. Avoiding tobacco and drug use. Limiting alcohol use. Practicing safe sex. Taking low-dose aspirin every day. Taking vitamin and mineral supplements as recommended by your health care provider. What happens during an annual well check? The services and screenings done by your health care provider during your annual well check will depend on your age, overall health, lifestyle risk factors, and family history of disease. Counseling  Your health care provider may ask you questions about your: Alcohol use. Tobacco use. Drug use. Emotional well-being. Home and relationship well-being. Sexual activity. Eating habits. History of falls. Memory and ability to understand (cognition). Work and work Astronomer. Reproductive health. Screening  You may have the following tests or measurements: Height, weight, and BMI. Blood pressure. Lipid and cholesterol levels. These may be checked every 5 years, or more frequently if you are over 58 years old. Skin check. Lung cancer screening. You may have this screening every year starting at age 65 if you have a 30-pack-year history of smoking and currently smoke or have quit within the past 15 years. Fecal occult blood test (FOBT) of the stool. You may have this test every year starting at age 24. Flexible sigmoidoscopy or colonoscopy.  You may have a sigmoidoscopy every 5 years or a colonoscopy every 10 years starting at age 49. Hepatitis C blood test. Hepatitis B blood test. Sexually transmitted disease (STD) testing. Diabetes screening. This is done by checking your blood sugar (glucose) after you have not eaten for a while (fasting). You may have this done every 1-3 years. Bone density scan. This is done to screen for osteoporosis. You may have this done starting at age 19. Mammogram. This may be done  every 1-2 years. Talk to your health care provider about how often you should have regular mammograms. Talk with your health care provider about your test results, treatment options, and if necessary, the need for more tests. Vaccines  Your health care provider may recommend certain vaccines, such as: Influenza vaccine. This is recommended every year. Tetanus, diphtheria, and acellular pertussis (Tdap, Td) vaccine. You may need a Td booster every 10 years. Zoster vaccine. You may need this after age 60. Pneumococcal 13-valent conjugate (PCV13) vaccine. One dose is recommended after age 40. Pneumococcal polysaccharide (PPSV23) vaccine. One dose is recommended after age 33. Talk to your health care provider about which screenings and vaccines you need and how often you need them. This information is not intended to replace advice given to you by your health care provider. Make sure you discuss any questions you have with your health care provider. Document Released: 10/16/2015 Document Revised: 06/08/2016 Document Reviewed: 07/21/2015 Elsevier Interactive Patient Education  2017 [employer] Prevention in the Home Falls can cause injuries. They can happen to people of all ages. There are many things you can do to make your home safe and to help prevent falls. What can I do on the outside of my home? Regularly fix the edges of walkways and driveways and fix any cracks. Remove anything that might make you trip as you walk through a door, such as a raised step or threshold. Trim any bushes or trees on the path to your home. Use bright outdoor lighting. Clear any walking paths of anything that might make someone trip, such as rocks or tools. Regularly check to see if handrails are loose or broken. Make sure that both sides of any steps have handrails. Any raised decks and porches should have guardrails on the edges. Have any leaves, snow, or ice cleared regularly. Use sand or salt on  walking paths during winter. Clean up any spills in your garage right away. This includes oil or grease spills. What can I do in the bathroom? Use night lights. Install grab bars by the toilet and in the tub and shower. Do not use towel bars as grab bars. Use non-skid mats or decals in the tub or shower. If you need to sit down in the shower, use a plastic, non-slip stool. Keep the floor dry. Clean up any water that spills on the floor as soon as it happens. Remove soap buildup in the tub or shower regularly. Attach bath mats securely with double-sided non-slip rug tape. Do not have throw rugs and other things on the floor that can make you trip. What can I do in the bedroom? Use night lights. Make sure that you have a light by your bed that is easy to reach. Do not use any sheets or blankets that are too big for your bed. They should not hang down onto the floor. Have a firm chair that has side arms. You can use this for support while you get dressed. Do not have  throw rugs and other things on the floor that can make you trip. What can I do in the kitchen? Clean up any spills right away. Avoid walking on wet floors. Keep items that you use a lot in easy-to-reach places. If you need to reach something above you, use a strong step stool that has a grab bar. Keep electrical cords out of the way. Do not use floor polish or wax that makes floors slippery. If you must use wax, use non-skid floor wax. Do not have throw rugs and other things on the floor that can make you trip. What can I do with my stairs? Do not leave any items on the stairs. Make sure that there are handrails on both sides of the stairs and use them. Fix handrails that are broken or loose. Make sure that handrails are as long as the stairways. Check any carpeting to make sure that it is firmly attached to the stairs. Fix any carpet that is loose or worn. Avoid having throw rugs at the top or bottom of the stairs. If you do  have throw rugs, attach them to the floor with carpet tape. Make sure that you have a light switch at the top of the stairs and the bottom of the stairs. If you do not have them, ask someone to add them for you. What else can I do to help prevent falls? Wear shoes that: Do not have high heels. Have rubber bottoms. Are comfortable and fit you well. Are closed at the toe. Do not wear sandals. If you use a stepladder: Make sure that it is fully opened. Do not climb a closed stepladder. Make sure that both sides of the stepladder are locked into place. Ask someone to hold it for you, if possible. Clearly mark and make sure that you can see: Any grab bars or handrails. First and last steps. Where the edge of each step is. Use tools that help you move around (mobility aids) if they are needed. These include: Canes. Walkers. Scooters. Crutches. Turn on the lights when you go into a dark area. Replace any light bulbs as soon as they burn out. Set up your furniture so you have a clear path. Avoid moving your furniture around. If any of your floors are uneven, fix them. If there are any pets around you, be aware of where they are. Review your medicines with your doctor. Some medicines can make you feel dizzy. This can increase your chance of falling. Ask your doctor what other things that you can do to help prevent falls. This information is not intended to replace advice given to you by your health care provider. Make sure you discuss any questions you have with your health care provider. Document Released: 07/16/2009 Document Revised: 02/25/2016 Document Reviewed: 10/24/2014 Elsevier Interactive Patient Education  2017 [employer].

## 2023-03-06 ENCOUNTER — Ambulatory Visit (INDEPENDENT_AMBULATORY_CARE_PROVIDER_SITE_OTHER): Payer: 59 | Admitting: Family Medicine

## 2023-03-06 ENCOUNTER — Encounter: Payer: Self-pay | Admitting: Family Medicine

## 2023-03-06 ENCOUNTER — Ambulatory Visit (INDEPENDENT_AMBULATORY_CARE_PROVIDER_SITE_OTHER): Payer: 59

## 2023-03-06 VITALS — BP 118/70 | HR 82 | Temp 97.6°F | Ht 64.0 in | Wt 181.6 lb

## 2023-03-06 DIAGNOSIS — K5900 Constipation, unspecified: Secondary | ICD-10-CM | POA: Diagnosis not present

## 2023-03-06 DIAGNOSIS — J069 Acute upper respiratory infection, unspecified: Secondary | ICD-10-CM | POA: Diagnosis not present

## 2023-03-06 DIAGNOSIS — R109 Unspecified abdominal pain: Secondary | ICD-10-CM | POA: Diagnosis not present

## 2023-03-06 MED ORDER — POLYETHYLENE GLYCOL 3350 17 GM/SCOOP PO POWD
17.0000 g | Freq: Every day | ORAL | 1 refills | Status: DC | PRN
Start: 2023-03-06 — End: 2023-09-21

## 2023-03-06 MED ORDER — PROMETHAZINE-DM 6.25-15 MG/5ML PO SYRP
5.0000 mL | ORAL_SOLUTION | Freq: Three times a day (TID) | ORAL | 0 refills | Status: DC | PRN
Start: 2023-03-06 — End: 2023-03-14

## 2023-03-06 MED ORDER — LEVOCETIRIZINE DIHYDROCHLORIDE 5 MG PO TABS
2.5000 mg | ORAL_TABLET | Freq: Every evening | ORAL | 0 refills | Status: DC
Start: 2023-03-06 — End: 2023-04-28

## 2023-03-06 NOTE — Progress Notes (Signed)
Acute Office Visit  Subjective:     Patient ID: Ariel [name], female    DOB: 1946/06/08, 77 y.o.   MRN: [id number]  Chief Complaint  Patient presents with   Follow-up    Cough and issues going to the bathroom (PCP and DOD booked), started this weekend, hard going to the restroom for BM     Constipation This is a new problem. The current episode started in the past 7 days. The problem is unchanged. The stool is described as firm and formed. Associated symptoms include bloating and flatus. Pertinent negatives include no abdominal pain, anorexia, diarrhea, difficulty urinating, fecal incontinence, fever, hematochezia, hemorrhoids, melena, nausea, rectal pain or vomiting. She has tried nothing for the symptoms.  Cough This is a new problem. The current episode started in the past 7 days. The problem has been gradually improving. The problem occurs hourly. The cough is Non-productive. Associated symptoms include headaches, nasal congestion, postnasal drip and rhinorrhea. Pertinent negatives include no chest pain, chills, ear congestion, ear pain, fever, heartburn, hemoptysis, myalgias, sore throat, shortness of breath or wheezing. Treatments tried: benadryl. The treatment provided no relief. There is no history of asthma, bronchitis, COPD or pneumonia.    Review of Systems  Constitutional:  Negative for chills and fever.  HENT:  Positive for postnasal drip and rhinorrhea. Negative for ear pain and sore throat.   Respiratory:  Positive for cough. Negative for hemoptysis, shortness of breath and wheezing.   Cardiovascular:  Negative for chest pain.  Gastrointestinal:  Positive for bloating, constipation and flatus. Negative for abdominal pain, anorexia, diarrhea, heartburn, hematochezia, hemorrhoids, melena, nausea, rectal pain and vomiting.  Genitourinary:  Negative for difficulty urinating.  Musculoskeletal:  Negative for myalgias.  Neurological:  Positive for headaches.         Objective:    BP 118/70   Pulse 82   Temp 97.6 F (36.4 C) (Oral)   Ht 5\' 4"  (1.626 m)   Wt 181 lb 9.6 oz (82.4 kg)   SpO2 97%   BMI 31.17 kg/m    Physical Exam Vitals and nursing note reviewed.  Constitutional:      General: She is not in acute distress.    Appearance: Normal appearance. She is not ill-appearing, toxic-appearing or diaphoretic.  HENT:     Head: Normocephalic and atraumatic.     Right Ear: Tympanic membrane, ear canal and external ear normal.     Left Ear: Tympanic membrane, ear canal and external ear normal.     Nose: Congestion present.     Mouth/Throat:     Mouth: Mucous membranes are moist.     Pharynx: Oropharynx is clear.  Eyes:     General:        Right eye: No discharge.        Left eye: No discharge.     Extraocular Movements: Extraocular movements intact.     Pupils: Pupils are equal, round, and reactive to light.  Cardiovascular:     Rate and Rhythm: Normal rate and regular rhythm.     Heart sounds: Normal heart sounds. No murmur heard. Pulmonary:     Effort: Pulmonary effort is normal. No respiratory distress.     Breath sounds: Normal breath sounds. No wheezing or rhonchi.  Abdominal:     General: Bowel sounds are normal. There is no distension.     Palpations: Abdomen is soft.     Tenderness: There is no abdominal tenderness. There is no guarding or rebound.  Musculoskeletal:     Cervical back: Neck supple. No rigidity.     Right lower leg: No edema.     Left lower leg: No edema.  Lymphadenopathy:     Cervical: No cervical adenopathy.  Skin:    General: Skin is warm and dry.  Neurological:     General: No focal deficit present.     Mental Status: She is alert and oriented to person, place, and time.  Psychiatric:        Mood and Affect: Mood normal.        Behavior: Behavior normal.     No results found for any visits on 03/06/23.      Assessment & Plan:   Jaci was seen today for follow-up.  Diagnoses and all  orders for this visit:  Constipation, unspecified constipation type Xray negative for blockage, agree with radiology read. Miralax daily prn, increase hydration.  -     DG Abd 1 View; Future -     polyethylene glycol powder (GLYCOLAX/MIRALAX) 17 GM/SCOOP powder; Take 17 g by mouth daily as needed (constipation).  Viral URI Discussed symptomatic care and return precautions.  -     levocetirizine (XYZAL) 5 MG tablet; Take 0.5 tablets (2.5 mg total) by mouth every evening. -     promethazine-dextromethorphan (PROMETHAZINE-DM) 6.25-15 MG/5ML syrup; Take 5 mLs by mouth 3 (three) times daily as needed for cough.   Return if symptoms worsen or fail to improve.  The patient indicates understanding of these issues and agrees with the plan.  Gabriel Earing, FNP

## 2023-03-13 ENCOUNTER — Telehealth: Payer: Self-pay | Admitting: Nurse Practitioner

## 2023-03-13 NOTE — Telephone Encounter (Signed)
Patient aware that she needs to be fasting for appt tomorrow

## 2023-03-14 ENCOUNTER — Ambulatory Visit: Payer: 59

## 2023-03-14 ENCOUNTER — Ambulatory Visit (INDEPENDENT_AMBULATORY_CARE_PROVIDER_SITE_OTHER): Payer: 59 | Admitting: Nurse Practitioner

## 2023-03-14 ENCOUNTER — Encounter: Payer: Self-pay | Admitting: Nurse Practitioner

## 2023-03-14 VITALS — BP 110/67 | HR 75 | Temp 97.6°F | Resp 20 | Ht 64.0 in | Wt 182.0 lb

## 2023-03-14 DIAGNOSIS — E6609 Other obesity due to excess calories: Secondary | ICD-10-CM

## 2023-03-14 DIAGNOSIS — R7309 Other abnormal glucose: Secondary | ICD-10-CM | POA: Diagnosis not present

## 2023-03-14 DIAGNOSIS — N3281 Overactive bladder: Secondary | ICD-10-CM

## 2023-03-14 DIAGNOSIS — E7800 Pure hypercholesterolemia, unspecified: Secondary | ICD-10-CM | POA: Diagnosis not present

## 2023-03-14 DIAGNOSIS — E8881 Metabolic syndrome: Secondary | ICD-10-CM

## 2023-03-14 DIAGNOSIS — I10 Essential (primary) hypertension: Secondary | ICD-10-CM | POA: Diagnosis not present

## 2023-03-14 DIAGNOSIS — Z6831 Body mass index (BMI) 31.0-31.9, adult: Secondary | ICD-10-CM

## 2023-03-14 DIAGNOSIS — Z780 Asymptomatic menopausal state: Secondary | ICD-10-CM

## 2023-03-14 LAB — BAYER DCA HB A1C WAIVED: HB A1C (BAYER DCA - WAIVED): 6 % — ABNORMAL HIGH (ref 4.8–5.6)

## 2023-03-14 MED ORDER — VALSARTAN-HYDROCHLOROTHIAZIDE 160-25 MG PO TABS
0.5000 | ORAL_TABLET | Freq: Every day | ORAL | 1 refills | Status: DC
Start: 2023-03-14 — End: 2023-06-14

## 2023-03-14 MED ORDER — MIRABEGRON ER 50 MG PO TB24
50.0000 mg | ORAL_TABLET | Freq: Every day | ORAL | 1 refills | Status: DC
Start: 2023-03-14 — End: 2023-09-21

## 2023-03-14 MED ORDER — ROSUVASTATIN CALCIUM 10 MG PO TABS
10.0000 mg | ORAL_TABLET | Freq: Every day | ORAL | 1 refills | Status: DC
Start: 2023-03-14 — End: 2023-09-21

## 2023-03-14 NOTE — Patient Instructions (Signed)

## 2023-03-14 NOTE — Progress Notes (Signed)
Subjective:    Patient ID: Ariel [name], female    DOB: [birth year], 77 y.o.   MRN: [id number]   Chief Complaint: Medical Management of Chronic Issues    HPI:  Ariel [name] is a 77 y.o. who identifies as a female who was assigned female at birth.   Social history: Lives with: husband Work history: retired   Water engineer in today for follow up of the following chronic medical issues:  1. Primary hypertension No c/o chest pain, sob or headache. Does nt check blood pressure at home. BP Readings from Last 3 Encounters:  03/14/23 110/67  03/06/23 118/70  02/02/23 106/63     2. Pure hypercholesterolemia Does not watch diet and does no dedicated exercise. Lab Results  Component Value Date   CHOL 196 09/12/2022   HDL 54 09/12/2022   LDLCALC 84 09/12/2022   TRIG 362 (H) 09/12/2022   CHOLHDL 3.6 09/12/2022     3. Metabolic syndrome Does not check blood sugars at home Lab Results  Component Value Date   HGBA1C 6.8 (H) 09/12/2022     4. Overactive bladder Is on myrbetriq and that is working well  5. Class 1 obesity due to excess calories with serious comorbidity and body mass index (BMI) of 33.0 to 33.9 in adult No recent weight changes Wt Readings from Last 3 Encounters:  03/14/23 182 lb (82.6 kg)  03/06/23 181 lb 9.6 oz (82.4 kg)  02/14/23 186 lb (84.4 kg)   BMI Readings from Last 3 Encounters:  03/14/23 31.24 kg/m  03/06/23 31.17 kg/m  02/14/23 31.93 kg/m      New complaints: None today  No Known Allergies Outpatient Encounter Medications as of 03/14/2023  Medication Sig   levocetirizine (XYZAL) 5 MG tablet Take 0.5 tablets (2.5 mg total) by mouth every evening.   mirabegron ER (MYRBETRIQ) 50 MG TB24 tablet Take 1 tablet (50 mg total) by mouth daily.   polyethylene glycol powder (GLYCOLAX/MIRALAX) 17 GM/SCOOP powder Take 17 g by mouth daily as needed (constipation).   rosuvastatin (CRESTOR) 10 MG tablet Take 1 tablet (10 mg total) by mouth  daily.   valsartan-hydrochlorothiazide (DIOVAN-HCT) 160-25 MG tablet Take 0.5 tablets by mouth daily.   [DISCONTINUED] naproxen (NAPROSYN) 500 MG tablet Take 1 tablet (500 mg total) by mouth 2 (two) times daily with a meal.   [DISCONTINUED] promethazine-dextromethorphan (PROMETHAZINE-DM) 6.25-15 MG/5ML syrup Take 5 mLs by mouth 3 (three) times daily as needed for cough.   No facility-administered encounter medications on file as of 03/14/2023.    Past Surgical History:  Procedure Laterality Date   ABDOMINAL HYSTERECTOMY  1985   CARPAL TUNNEL RELEASE Right 2007   CARPAL TUNNEL RELEASE Left 03/17/2016   Procedure: LEFT CARPAL TUNNEL RELEASE;  Surgeon: Vickki Hearing, MD;  Location: [facility];  Service: Orthopedics;  Laterality: Left;   CLOSED REDUCTION FINGER WITH PERCUTANEOUS PINNING Right 06/20/2019   Procedure: RIGHT SMALL FINGER CLOSED REDUCTION PIN FIXATION;  Surgeon: Betha Loa, MD;  Location: [facility];  Service: Orthopedics;  Laterality: Right;    Family History  Problem Relation Age of Onset   Diabetes Mother    Cancer Father 44       colon   Alcohol abuse Brother    Cancer Brother        stomach   Lupus Brother    Breast cancer Neg Hx       Controlled substance contract: n/a     Review of Systems  Constitutional:  Negative for diaphoresis.  Eyes:  Negative for pain.  Respiratory:  Negative for shortness of breath.   Cardiovascular:  Negative for chest pain, palpitations and leg swelling.  Gastrointestinal:  Negative for abdominal pain.  Endocrine: Negative for polydipsia.  Skin:  Negative for rash.  Neurological:  Negative for dizziness, weakness and headaches.  Hematological:  Does not bruise/bleed easily.  All other systems reviewed and are negative.      Objective:   Physical Exam Vitals and nursing note reviewed.  Constitutional:      General: She is not in acute distress.    Appearance: Normal appearance. She is well-developed.   HENT:     Head: Normocephalic.     Right Ear: Tympanic membrane normal.     Left Ear: Tympanic membrane normal.     Nose: Nose normal.     Mouth/Throat:     Mouth: Mucous membranes are moist.  Eyes:     Pupils: Pupils are equal, round, and reactive to light.  Neck:     Vascular: No carotid bruit or JVD.  Cardiovascular:     Rate and Rhythm: Normal rate and regular rhythm.     Heart sounds: Normal heart sounds.  Pulmonary:     Effort: Pulmonary effort is normal. No respiratory distress.     Breath sounds: Normal breath sounds. No wheezing or rales.  Chest:     Chest wall: No tenderness.  Abdominal:     General: Bowel sounds are normal. There is no distension or abdominal bruit.     Palpations: Abdomen is soft. There is no hepatomegaly, splenomegaly, mass or pulsatile mass.     Tenderness: There is no abdominal tenderness.  Musculoskeletal:        General: Normal range of motion.     Cervical back: Normal range of motion and neck supple.  Lymphadenopathy:     Cervical: No cervical adenopathy.  Skin:    General: Skin is warm and dry.  Neurological:     Mental Status: She is alert and oriented to person, place, and time.     Deep Tendon Reflexes: Reflexes are normal and symmetric.  Psychiatric:        Behavior: Behavior normal.        Thought Content: Thought content normal.        Judgment: Judgment normal.     BP 110/67   Pulse 75   Temp 97.6 F (36.4 C) (Temporal)   Resp 20   Ht 5\' 4"  (1.626 m)   Wt 182 lb (82.6 kg)   SpO2 98%   BMI 31.24 kg/m   HGA1c 6.0%     Assessment & Plan:  Ineze Ninon [name] comes in today with chief complaint of Medical Management of Chronic Issues   Diagnosis and orders addressed:  1. Primary hypertension Low sodium diet - CBC with Differential/Platelet - CMP14+EGFR - valsartan-hydrochlorothiazide (DIOVAN-HCT) 160-25 MG tablet; Take 0.5 tablets by mouth daily.  Dispense: 90 tablet; Refill: 1  2. Pure  hypercholesterolemia Low fat diet - Lipid panel - rosuvastatin (CRESTOR) 10 MG tablet; Take 1 tablet (10 mg total) by mouth daily.  Dispense: 90 tablet; Refill: 1  3. Metabolic syndrome Continue to watch carbs in diet - Bayer DCA Hb A1c Waived  4. Overactive bladder - mirabegron ER (MYRBETRIQ) 50 MG TB24 tablet; Take 1 tablet (50 mg total) by mouth daily.  Dispense: 90 tablet; Refill: 1  5. Class 1 obesity due to excess calories with serious comorbidity and body mass  index (BMI) of 33.0 to 33.9 in adult Discussed diet and exercise for person with BMI >25 Will recheck weight in 3-6 months    Labs pending Health Maintenance reviewed Diet and exercise encouraged  Follow up plan: 6 months   Mary-Margaret Daphine Deutscher, FNP

## 2023-03-15 LAB — CBC WITH DIFFERENTIAL/PLATELET
Basophils Absolute: 0.1 10*3/uL (ref 0.0–0.2)
Basos: 1 %
EOS (ABSOLUTE): 0.2 10*3/uL (ref 0.0–0.4)
Eos: 2 %
Hematocrit: 39.4 % (ref 34.0–46.6)
Hemoglobin: 12.7 g/dL (ref 11.1–15.9)
Immature Grans (Abs): 0 10*3/uL (ref 0.0–0.1)
Immature Granulocytes: 0 %
Lymphocytes Absolute: 2.6 10*3/uL (ref 0.7–3.1)
Lymphs: 36 %
MCH: 27.9 pg (ref 26.6–33.0)
MCHC: 32.2 g/dL (ref 31.5–35.7)
MCV: 87 fL (ref 79–97)
Monocytes Absolute: 0.8 10*3/uL (ref 0.1–0.9)
Monocytes: 11 %
Neutrophils Absolute: 3.5 10*3/uL (ref 1.4–7.0)
Neutrophils: 50 %
Platelets: 284 10*3/uL (ref 150–450)
RBC: 4.55 x10E6/uL (ref 3.77–5.28)
RDW: 13.2 % (ref 11.7–15.4)
WBC: 7.2 10*3/uL (ref 3.4–10.8)

## 2023-03-15 LAB — LIPID PANEL
Chol/HDL Ratio: 2.9 ratio (ref 0.0–4.4)
Cholesterol, Total: 177 mg/dL (ref 100–199)
HDL: 62 mg/dL (ref >39)
LDL Chol Calc (NIH): 93 mg/dL (ref 0–99)
Triglycerides: 124 mg/dL (ref 0–149)
VLDL Cholesterol Cal: 22 mg/dL (ref 5–40)

## 2023-03-15 LAB — CMP14+EGFR
ALT: 17 IU/L (ref 0–32)
AST: 18 IU/L (ref 0–40)
Albumin/Globulin Ratio: 1.5
Albumin: 4.4 g/dL (ref 3.8–4.8)
Alkaline Phosphatase: 87 IU/L (ref 44–121)
BUN/Creatinine Ratio: 22 (ref 12–28)
BUN: 15 mg/dL (ref 8–27)
Bilirubin Total: <0.2 mg/dL (ref 0.0–1.2)
CO2: 26 mmol/L (ref 20–29)
Calcium: 10.6 mg/dL — ABNORMAL HIGH (ref 8.7–10.3)
Chloride: 104 mmol/L (ref 96–106)
Creatinine, Ser: 0.68 mg/dL (ref 0.57–1.00)
Globulin, Total: 3 g/dL (ref 1.5–4.5)
Glucose: 90 mg/dL (ref 70–99)
Potassium: 4.5 mmol/L (ref 3.5–5.2)
Sodium: 142 mmol/L (ref 134–144)
Total Protein: 7.4 g/dL (ref 6.0–8.5)
eGFR: 90 mL/min/{1.73_m2} (ref >59)

## 2023-04-17 DIAGNOSIS — U071 COVID-19: Secondary | ICD-10-CM | POA: Diagnosis not present

## 2023-04-20 ENCOUNTER — Ambulatory Visit (INDEPENDENT_AMBULATORY_CARE_PROVIDER_SITE_OTHER): Payer: 59 | Admitting: Family

## 2023-04-20 ENCOUNTER — Encounter: Payer: Self-pay | Admitting: Family

## 2023-04-20 ENCOUNTER — Telehealth: Payer: Self-pay | Admitting: Nurse Practitioner

## 2023-04-20 ENCOUNTER — Ambulatory Visit: Payer: 59

## 2023-04-20 VITALS — BP 130/72 | HR 75 | Temp 97.5°F | Ht 64.0 in | Wt 180.2 lb

## 2023-04-20 DIAGNOSIS — U071 COVID-19: Secondary | ICD-10-CM

## 2023-04-20 NOTE — Telephone Encounter (Signed)
Patient aware and verbalized understanding. SHE HAD ALREADY TESTED + AWARE NOT NEEDED TEST AGAIN

## 2023-04-20 NOTE — Patient Instructions (Signed)

## 2023-04-20 NOTE — Progress Notes (Signed)
Subjective:    Patient ID: Ariel [name], female    DOB: 01/13/1946, 77 y.o.   MRN: [id number]  Chief Complaint  Patient presents with   Covid Positive   PT presents to the office today with COVID. She was in [place] and went to the Urgent Care on Monday with a cough and diagnosed with a cough and rhinorrhea that started that day. She as given paxlovid, cough syrup, and albuterol. Denies any SOB. Reports she is feeling better.    Cough This is a new problem. The current episode started in the past 7 days. The problem has been unchanged. The problem occurs every few minutes. The cough is Non-productive. Associated symptoms include rhinorrhea. Pertinent negatives include no ear congestion, ear pain, fever, myalgias, nasal congestion, shortness of breath or wheezing. She has tried rest for the symptoms. The treatment provided mild relief.      Review of Systems  Constitutional:  Negative for fever.  HENT:  Positive for rhinorrhea. Negative for ear pain.   Respiratory:  Positive for cough. Negative for shortness of breath and wheezing.   Musculoskeletal:  Negative for myalgias.  All other systems reviewed and are negative.      Objective:   Physical Exam Vitals reviewed.  Constitutional:      General: She is not in acute distress.    Appearance: She is well-developed.  HENT:     Head: Normocephalic and atraumatic.     Right Ear: Tympanic membrane normal.     Left Ear: A middle ear effusion is present.  Eyes:     Pupils: Pupils are equal, round, and reactive to light.  Neck:     Thyroid: No thyromegaly.  Cardiovascular:     Rate and Rhythm: Normal rate and regular rhythm.     Heart sounds: Normal heart sounds. No murmur heard. Pulmonary:     Effort: Pulmonary effort is normal. No respiratory distress.     Breath sounds: Normal breath sounds. No wheezing.  Abdominal:     General: Bowel sounds are normal. There is no distension.     Palpations: Abdomen is soft.      Tenderness: There is no abdominal tenderness.  Musculoskeletal:        General: No tenderness. Normal range of motion.     Cervical back: Normal range of motion and neck supple.  Skin:    General: Skin is warm and dry.  Neurological:     Mental Status: She is alert and oriented to person, place, and time.     Cranial Nerves: No cranial nerve deficit.     Deep Tendon Reflexes: Reflexes are normal and symmetric.  Psychiatric:        Behavior: Behavior normal.        Thought Content: Thought content normal.        Judgment: Judgment normal.     BP 130/72   Pulse 75   Temp (!) 97.5 F (36.4 C) (Temporal)   Ht 5\' 4"  (1.626 m)   Wt 180 lb 3.2 oz (81.7 kg)   SpO2 96%   BMI 30.93 kg/m        Assessment & Plan:  Ariel [name] comes in today with chief complaint of Covid Positive   Diagnosis and orders addressed:  1. COVID-19 COVID positive, rest, force fluids, tylenol as needed,  report any worsening symptoms such as increased shortness of breath, swelling, or continued high fevers. Will hold off on albuterol inhaler. Use cough medicine.  Reassurance given.    Jannifer Rodney, FNP

## 2023-04-28 ENCOUNTER — Other Ambulatory Visit: Payer: Self-pay | Admitting: Family Medicine

## 2023-04-28 DIAGNOSIS — J069 Acute upper respiratory infection, unspecified: Secondary | ICD-10-CM

## 2023-05-14 DIAGNOSIS — S80212A Abrasion, left knee, initial encounter: Secondary | ICD-10-CM | POA: Diagnosis not present

## 2023-05-14 DIAGNOSIS — R2689 Other abnormalities of gait and mobility: Secondary | ICD-10-CM | POA: Diagnosis not present

## 2023-05-14 DIAGNOSIS — R0600 Dyspnea, unspecified: Secondary | ICD-10-CM | POA: Diagnosis not present

## 2023-06-14 ENCOUNTER — Other Ambulatory Visit: Payer: Self-pay | Admitting: Nurse Practitioner

## 2023-06-14 DIAGNOSIS — I10 Essential (primary) hypertension: Secondary | ICD-10-CM

## 2023-09-14 ENCOUNTER — Ambulatory Visit: Payer: 59 | Admitting: Nurse Practitioner

## 2023-09-18 ENCOUNTER — Telehealth: Payer: Self-pay | Admitting: Family Medicine

## 2023-09-18 NOTE — Telephone Encounter (Signed)
Copied from CRM [id number]. Topic: Clinical - Request for Lab/Test Order >> Sep 18, 2023 10:54 AM Orinda Kenner C wrote: Reason for CRM: Pt has an upcoming appt 09/21/23 and is wondering if she needs to fast for labs for this appt. Pls advise and c/b at [phone]

## 2023-09-18 NOTE — Telephone Encounter (Signed)
Patient aware she will need to be fasting and she can drink black coffee, water, or diet drink

## 2023-09-20 ENCOUNTER — Telehealth: Payer: Self-pay

## 2023-09-20 NOTE — Telephone Encounter (Signed)
Copied from CRM [id number]. Topic: Clinical - Medication Question >> Sep 20, 2023  9:31 AM Ariel [name] wrote: Reason for CRM: Pt called in to confirm if she can take blood pressure rx before appt on 12/19, pt would like a callback

## 2023-09-20 NOTE — Telephone Encounter (Signed)
Informed pt okay to take medication. Pt verbalized understanding

## 2023-09-21 ENCOUNTER — Ambulatory Visit: Payer: Self-pay | Admitting: Nurse Practitioner

## 2023-09-21 ENCOUNTER — Encounter: Payer: Self-pay | Admitting: Nurse Practitioner

## 2023-09-21 ENCOUNTER — Ambulatory Visit: Payer: 59 | Admitting: Nurse Practitioner

## 2023-09-21 VITALS — BP 97/64 | HR 85 | Temp 98.1°F | Ht 64.0 in | Wt 180.0 lb

## 2023-09-21 DIAGNOSIS — Z6833 Body mass index (BMI) 33.0-33.9, adult: Secondary | ICD-10-CM

## 2023-09-21 DIAGNOSIS — K5900 Constipation, unspecified: Secondary | ICD-10-CM

## 2023-09-21 DIAGNOSIS — E6609 Other obesity due to excess calories: Secondary | ICD-10-CM

## 2023-09-21 DIAGNOSIS — N3281 Overactive bladder: Secondary | ICD-10-CM | POA: Diagnosis not present

## 2023-09-21 DIAGNOSIS — I10 Essential (primary) hypertension: Secondary | ICD-10-CM

## 2023-09-21 DIAGNOSIS — E7800 Pure hypercholesterolemia, unspecified: Secondary | ICD-10-CM

## 2023-09-21 DIAGNOSIS — R7309 Other abnormal glucose: Secondary | ICD-10-CM | POA: Diagnosis not present

## 2023-09-21 DIAGNOSIS — E8881 Metabolic syndrome: Secondary | ICD-10-CM | POA: Diagnosis not present

## 2023-09-21 DIAGNOSIS — E66811 Obesity, class 1: Secondary | ICD-10-CM

## 2023-09-21 LAB — BAYER DCA HB A1C WAIVED: HB A1C (BAYER DCA - WAIVED): 6 % — ABNORMAL HIGH (ref 4.8–5.6)

## 2023-09-21 MED ORDER — VALSARTAN-HYDROCHLOROTHIAZIDE 160-25 MG PO TABS: 0.5000 | ORAL_TABLET | Freq: Every day | ORAL | 1 refills | Status: DC

## 2023-09-21 MED ORDER — ROSUVASTATIN CALCIUM 10 MG PO TABS: 10.0000 mg | ORAL_TABLET | Freq: Every day | ORAL | 1 refills | Status: DC

## 2023-09-21 MED ORDER — POLYETHYLENE GLYCOL 3350 17 GM/SCOOP PO POWD: 17.0000 g | Freq: Every day | ORAL | 1 refills | Status: AC | PRN

## 2023-09-21 MED ORDER — MIRABEGRON ER 50 MG PO TB24: 50.0000 mg | ORAL_TABLET | Freq: Every day | ORAL | 1 refills | Status: DC

## 2023-09-21 NOTE — Telephone Encounter (Signed)
Patient aware she can eat.

## 2023-09-21 NOTE — Progress Notes (Signed)
Subjective:    Patient ID: Ariel [name], female    DOB: [birth year], 77 y.o.   MRN: [id number]   Chief Complaint: Medical Management of Chronic Issues    HPI:  Ariel [name] is a 77 y.o. who identifies as a female who was assigned female at birth.   Social history: Lives with: husband Work history: retired   Water engineer in today for follow up of the following chronic medical issues:  1. Primary hypertension No c/o chest pain, sob or headache. Does nt check blood pressure at home. BP Readings from Last 3 Encounters:  09/21/23 97/64  04/20/23 130/72  03/14/23 110/67     2. Pure hypercholesterolemia Does not watch diet and does no dedicated exercise. Lab Results  Component Value Date   CHOL 177 03/14/2023   HDL 62 03/14/2023   LDLCALC 93 03/14/2023   TRIG 124 03/14/2023   CHOLHDL 2.9 03/14/2023     3. Metabolic syndrome Does not check blood sugars at home Lab Results  Component Value Date   HGBA1C 6.0 (H) 03/14/2023     4. Overactive bladder Is on myrbetriq and that is working well  5. Class 1 obesity due to excess calories with serious comorbidity and body mass index (BMI) of 33.0 to 33.9 in adult No recent weight changes Wt Readings from Last 3 Encounters:  09/21/23 180 lb (81.6 kg)  04/20/23 180 lb 3.2 oz (81.7 kg)  03/14/23 182 lb (82.6 kg)   BMI Readings from Last 3 Encounters:  09/21/23 30.90 kg/m  04/20/23 30.93 kg/m  03/14/23 31.24 kg/m      New complaints: None today  No Known Allergies Outpatient Encounter Medications as of 09/21/2023  Medication Sig   levocetirizine (XYZAL) 5 MG tablet TAKE 1/2 TABLET (2.5 MG TOTAL) BY MOUTH EVERY EVENING.   mirabegron ER (MYRBETRIQ) 50 MG TB24 tablet Take 1 tablet (50 mg total) by mouth daily.   PAXLOVID, 300/100, 20 x 150 MG & 10 x 100MG  TBPK Take by mouth.   polyethylene glycol powder (GLYCOLAX/MIRALAX) 17 GM/SCOOP powder Take 17 g by mouth daily as needed (constipation).   rosuvastatin  (CRESTOR) 10 MG tablet Take 1 tablet (10 mg total) by mouth daily.   valsartan-hydrochlorothiazide (DIOVAN-HCT) 160-25 MG tablet TAKE 1/2 TABLET BY MOUTH DAILY   VENTOLIN HFA 108 (90 Base) MCG/ACT inhaler Inhale 2 puffs into the lungs every 4 (four) hours.   No facility-administered encounter medications on file as of 09/21/2023.    Past Surgical History:  Procedure Laterality Date   ABDOMINAL HYSTERECTOMY  1985   CARPAL TUNNEL RELEASE Right 2007   CARPAL TUNNEL RELEASE Left 03/17/2016   Procedure: LEFT CARPAL TUNNEL RELEASE;  Surgeon: Vickki Hearing, MD;  Location: [facility];  Service: Orthopedics;  Laterality: Left;   CLOSED REDUCTION FINGER WITH PERCUTANEOUS PINNING Right 06/20/2019   Procedure: RIGHT SMALL FINGER CLOSED REDUCTION PIN FIXATION;  Surgeon: Betha Loa, MD;  Location: [facility];  Service: Orthopedics;  Laterality: Right;    Family History  Problem Relation Age of Onset   Diabetes Mother    Cancer Father 46       colon   Alcohol abuse Brother    Cancer Brother        stomach   Lupus Brother    Breast cancer Neg Hx       Controlled substance contract: n/a     Review of Systems  Constitutional:  Negative for diaphoresis.  Eyes:  Negative for pain.  Respiratory:  Negative for shortness of breath.   Cardiovascular:  Negative for chest pain, palpitations and leg swelling.  Gastrointestinal:  Negative for abdominal pain.  Endocrine: Negative for polydipsia.  Skin:  Negative for rash.  Neurological:  Negative for dizziness, weakness and headaches.  Hematological:  Does not bruise/bleed easily.  All other systems reviewed and are negative.      Objective:   Physical Exam Vitals and nursing note reviewed.  Constitutional:      General: She is not in acute distress.    Appearance: Normal appearance. She is well-developed.  HENT:     Head: Normocephalic.     Right Ear: Tympanic membrane normal.     Left Ear: Tympanic membrane normal.      Nose: Nose normal.     Mouth/Throat:     Mouth: Mucous membranes are moist.  Eyes:     Pupils: Pupils are equal, round, and reactive to light.  Neck:     Vascular: No carotid bruit or JVD.  Cardiovascular:     Rate and Rhythm: Normal rate and regular rhythm.     Heart sounds: Normal heart sounds.  Pulmonary:     Effort: Pulmonary effort is normal. No respiratory distress.     Breath sounds: Normal breath sounds. No wheezing or rales.  Chest:     Chest wall: No tenderness.  Abdominal:     General: Bowel sounds are normal. There is no distension or abdominal bruit.     Palpations: Abdomen is soft. There is no hepatomegaly, splenomegaly, mass or pulsatile mass.     Tenderness: There is no abdominal tenderness.  Musculoskeletal:        General: Normal range of motion.     Cervical back: Normal range of motion and neck supple.  Lymphadenopathy:     Cervical: No cervical adenopathy.  Skin:    General: Skin is warm and dry.  Neurological:     Mental Status: She is alert and oriented to person, place, and time.     Deep Tendon Reflexes: Reflexes are normal and symmetric.  Psychiatric:        Behavior: Behavior normal.        Thought Content: Thought content normal.        Judgment: Judgment normal.     BP 97/64   Pulse 85   Temp 98.1 F (36.7 C) (Temporal)   Ht 5\' 4"  (1.626 m)   Wt 180 lb (81.6 kg)   SpO2 99%   BMI 30.90 kg/m   HGBA1c 6.0%     Assessment & Plan:   Ariel [name] comes in today with chief complaint of Medical Management of Chronic Issues   Diagnosis and orders addressed:  1. Metabolic syndrome (Primary) Low carb diet - Bayer DCA Hb A1c Waived - CBC with Differential/Platelet - CMP14+EGFR  2. Overactive bladder - mirabegron ER (MYRBETRIQ) 50 MG TB24 tablet; Take 1 tablet (50 mg total) by mouth daily.  Dispense: 90 tablet; Refill: 1  3. Constipation, unspecified constipation type Increase fiber in diet - polyethylene glycol powder  (GLYCOLAX/MIRALAX) 17 GM/SCOOP powder; Take 17 g by mouth daily as needed (constipation).  Dispense: 3350 g; Refill: 1  4. Pure hypercholesterolemia Low fat diet - Lipid panel - rosuvastatin (CRESTOR) 10 MG tablet; Take 1 tablet (10 mg total) by mouth daily.  Dispense: 90 tablet; Refill: 1  5. Primary hypertension Low sodium diet - valsartan-hydrochlorothiazide (DIOVAN-HCT) 160-25 MG tablet; Take 0.5 tablets by mouth daily.  Dispense: 45  tablet; Refill: 1  6. Class 1 obesity due to excess calories with serious comorbidity and body mass index (BMI) of 33.0 to 33.9 in adult Discussed diet and exercise for person with BMI >25 Will recheck weight in 3-6 months    Labs pending Health Maintenance reviewed Diet and exercise encouraged  Follow up plan: 6 months   Ariel Daphine Deutscher, FNP

## 2023-09-21 NOTE — Telephone Encounter (Addendum)
  Chief Complaint: asking if she can have food/water before appt today  Disposition: [] ED /[] Urgent Care (no appt availability in office) / [] Appointment(In office/virtual)/ []  [facility]/ [] Home Care/ [] Refused Recommended Disposition /[]  Mobile Bus/ [x]  Follow-up with PCP Additional Notes: Pt states she is a diabetic and needs to take her BP meds. Pt states she feels hungry and like she should eat something before she gets lightheaded. Pt is asking if she can have saltine crackers, black coffee or water before her OV appt. Called CAL line, no answer x 2. Instructed pt to have saltine crackers and water so that she does not become symptomatic  (Told pt no coffee and no other food/drink besides saltine and water). Pt states she does not have a blood glucometer to check her BG. Informed pt that due to eating and drinking some lab work may need to be rescheduled, pt verbalizes understanding. Pt instructed on concerning symptoms and when to call back.   Copied from CRM [id number]. Topic: Clinical - Pink Word Triage >> Sep 21, 2023 10:15 AM Elle L wrote: Reason for Triage: The patient is fasting for her labs for her appointment today at 3 but is concerned about getting lightheaded and wanted to see if she can eat or advise of what to do. Reason for Disposition  [1] Caller requesting NON-URGENT health information AND [2] PCP's office is the best resource  Answer Assessment - Initial Assessment Questions 1. REASON FOR CALL or QUESTION: "What is your reason for calling today?" or "How can I best help you?" or "What question do you have that I can help answer?"     Patient states she is fasting for an appt today at 3pm, concerned because she is diabetic and has high blood pressure. Pt is asking if she can have saltine crackers, black coffee or water. Pt states she started to feel hungry and doesn't want to get lightheaded or pass out. Pt states she does not have a blood glucometer at  home to check her BG.  2. CALLER: Document the source of call. (e.g., laboratory, patient).     Patient.  Answer Assessment - Initial Assessment Questions 1. REASON FOR CALL or QUESTION: "What is your reason for calling today?" or "How can I best help you?" or "What question do you have that I can help answer?"      Patient states she is fasting for an appt today at 3pm, concerned because she is diabetic and has high blood pressure. Pt is asking if she can have saltine crackers, black coffee or water. Pt states she started to feel hungry and doesn't want to get lightheaded or pass out. Pt states she does not have a blood glucometer at home to check her BG. Attempted to call CAL line for [facility], no answer x 2. Instructed patient to have saltines and water so that she does not become symptomatic.  Protocols used: PCP Call - No Triage-A-AH, Information Only Call - No Triage-A-AH

## 2023-09-22 LAB — CMP14+EGFR
ALT: 15 [IU]/L (ref 0–32)
AST: 18 [IU]/L (ref 0–40)
Albumin: 4.3 g/dL (ref 3.8–4.8)
Alkaline Phosphatase: 80 [IU]/L (ref 44–121)
BUN/Creatinine Ratio: 20 (ref 12–28)
BUN: 19 mg/dL (ref 8–27)
Bilirubin Total: <0.2 mg/dL (ref 0.0–1.2)
CO2: 24 mmol/L (ref 20–29)
Calcium: 10.2 mg/dL (ref 8.7–10.3)
Chloride: 103 mmol/L (ref 96–106)
Creatinine, Ser: 0.93 mg/dL (ref 0.57–1.00)
Globulin, Total: 2.9 g/dL (ref 1.5–4.5)
Glucose: 86 mg/dL (ref 70–99)
Potassium: 4.1 mmol/L (ref 3.5–5.2)
Sodium: 143 mmol/L (ref 134–144)
Total Protein: 7.2 g/dL (ref 6.0–8.5)
eGFR: 64 mL/min/{1.73_m2} (ref >59)

## 2023-09-22 LAB — CBC WITH DIFFERENTIAL/PLATELET
Basophils Absolute: 0.1 10*3/uL (ref 0.0–0.2)
Basos: 1 %
EOS (ABSOLUTE): 0.2 10*3/uL (ref 0.0–0.4)
Eos: 2 %
Hematocrit: 37 % (ref 34.0–46.6)
Hemoglobin: 12 g/dL (ref 11.1–15.9)
Immature Grans (Abs): 0 10*3/uL (ref 0.0–0.1)
Immature Granulocytes: 0 %
Lymphocytes Absolute: 2.6 10*3/uL (ref 0.7–3.1)
Lymphs: 40 %
MCH: 28 pg (ref 26.6–33.0)
MCHC: 32.4 g/dL (ref 31.5–35.7)
MCV: 86 fL (ref 79–97)
Monocytes Absolute: 1.1 10*3/uL — ABNORMAL HIGH (ref 0.1–0.9)
Monocytes: 17 %
Neutrophils Absolute: 2.5 10*3/uL (ref 1.4–7.0)
Neutrophils: 40 %
Platelets: 274 10*3/uL (ref 150–450)
RBC: 4.28 x10E6/uL (ref 3.77–5.28)
RDW: 12.7 % (ref 11.7–15.4)
WBC: 6.4 10*3/uL (ref 3.4–10.8)

## 2023-09-22 LAB — LIPID PANEL
Chol/HDL Ratio: 3.3 {ratio} (ref 0.0–4.4)
Cholesterol, Total: 162 mg/dL (ref 100–199)
HDL: 49 mg/dL (ref >39)
LDL Chol Calc (NIH): 84 mg/dL (ref 0–99)
Triglycerides: 167 mg/dL — ABNORMAL HIGH (ref 0–149)
VLDL Cholesterol Cal: 29 mg/dL (ref 5–40)

## 2023-12-07 ENCOUNTER — Other Ambulatory Visit (INDEPENDENT_AMBULATORY_CARE_PROVIDER_SITE_OTHER): Payer: Self-pay

## 2023-12-07 ENCOUNTER — Ambulatory Visit (INDEPENDENT_AMBULATORY_CARE_PROVIDER_SITE_OTHER): Payer: 59 | Admitting: Orthopedic Surgery

## 2023-12-07 VITALS — BP 128/78 | HR 82 | Ht 64.0 in | Wt 180.0 lb

## 2023-12-07 DIAGNOSIS — R200 Anesthesia of skin: Secondary | ICD-10-CM | POA: Diagnosis not present

## 2023-12-07 DIAGNOSIS — M79641 Pain in right hand: Secondary | ICD-10-CM

## 2023-12-07 MED ORDER — METHYLPREDNISOLONE ACETATE 40 MG/ML IJ SUSP: 40.0000 mg | Freq: Once | INTRAMUSCULAR | Status: AC

## 2023-12-07 NOTE — Patient Instructions (Signed)
We are referring you to [facility] from [facility] Office address is [address] [place] The phone number is [phone]  The office will call you with an appointment Dr. Newton will do the nerve study   

## 2023-12-07 NOTE — Progress Notes (Signed)
  Intake history:  BP 128/78   Pulse 82   Ht 5\' 4"  (1.626 m)   Wt 180 lb (81.6 kg)   BMI 30.90 kg/m  Body mass index is 30.9 kg/m.    WHAT ARE WE SEEING YOU FOR TODAY?   right shoulder / numbness right hand/ states shoulder not painful with ROM and neck not painful   How long has this bothered you? (DOI?DOS?WS?)  Approx 1 year ago fell at work   Astronomer.  No  Diabetes No  Heart disease No  Hypertension Yes  SMOKING HX No  Kidney disease No  Any ALLERGIES ______________________________________________   Treatment:  Have you taken:  Tylenol Yes  Advil No  Had PT No  Had injection No  Other  _________________________

## 2023-12-07 NOTE — Progress Notes (Signed)
  Subjective:     Patient ID: Ariel [name], female   DOB: 11-May-1946, 78 y.o.   MRN: [id number]  This is a 78 year old female complains of numbness in her right index finger.  She fell sometime ago I saw her in May 2023 x-rays of the shoulder were negative as she complained of shoulder pain at that time  Now her symptoms seem to be localized in the right index finger.  She has some triggering there is some tenderness in the palm and numbness of the digit.  Denies numbness in other areas of the upper extremity  Shoulder Pain      Review of Systems  Musculoskeletal:  Negative for neck pain.  Neurological:  Negative for weakness.       Objective:   Physical Exam Vitals and nursing note reviewed.  Constitutional:      Appearance: Normal appearance.  HENT:     Head: Normocephalic and atraumatic.  Eyes:     General: No scleral icterus.       Right eye: No discharge.        Left eye: No discharge.     Extraocular Movements: Extraocular movements intact.     Conjunctiva/sclera: Conjunctivae normal.     Pupils: Pupils are equal, round, and reactive to light.  Cardiovascular:     Rate and Rhythm: Normal rate.     Pulses: Normal pulses.  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  She does have some numbness in the right index finger and on the dorsum of the right hand  Shoulder does not exhibit any numbness rest of the arm is normal as well.  Skin:    General: Skin is warm and dry.     Capillary Refill: Capillary refill takes less than 2 seconds.  Neurological:     General: No focal deficit present.     Mental Status: She is alert and oriented to person, place, and time.  Psychiatric:        Mood and Affect: Mood normal.         Behavior: Behavior normal.        Thought Content: Thought content normal.        Judgment: Judgment normal.        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

## 2023-12-13 ENCOUNTER — Encounter: Admitting: Physical Medicine and Rehabilitation

## 2023-12-28 ENCOUNTER — Ambulatory Visit: Admitting: Orthopedic Surgery

## 2024-01-09 ENCOUNTER — Telehealth: Payer: Self-pay | Admitting: Nurse Practitioner

## 2024-01-09 NOTE — Telephone Encounter (Signed)
 Copied from CRM [id number]. Topic: Referral - Request for Referral >> Jan 09, 2024  4:22 PM Turkey B wrote: Did the patient discuss referral with their provider in the last year? Yes its for routine  Type of order/referral and detailed reason for visit: routine mammogram  Preference of office, provider, location: [address] [place], [place]  If referral order, have you been seen by this specialty before? yes (If Yes, this issue or another issue? When? Where? This issue, doesn't know exactly when, or te name of office, just location gave on [address] in [place]  Can we respond through MyChart? yes

## 2024-01-09 NOTE — Telephone Encounter (Signed)
 Please schedule patient for mammogram on mobile bus

## 2024-01-10 NOTE — Telephone Encounter (Signed)
 Spoke with pt and got her scheduled for mammo on 02/28/24.

## 2024-01-30 NOTE — Telephone Encounter (Signed)
 Copied from CRM [id number]. Topic: Appointments - Scheduling Inquiry for Clinic >> Jan 30, 2024  2:06 PM Baldomero Bone wrote: Reason for CRM: Patient needs to reschedule her mammogram appointment. No answer on CAL. Callback number is [phone]

## 2024-02-15 NOTE — Progress Notes (Signed)
 This encounter was created in error - please disregard.

## 2024-02-27 ENCOUNTER — Other Ambulatory Visit: Payer: Self-pay | Admitting: Nurse Practitioner

## 2024-02-27 DIAGNOSIS — Z1231 Encounter for screening mammogram for malignant neoplasm of breast: Secondary | ICD-10-CM

## 2024-02-28 ENCOUNTER — Encounter

## 2024-03-11 ENCOUNTER — Ambulatory Visit
Admission: RE | Admit: 2024-03-11 | Discharge: 2024-03-11 | Disposition: A | Discharge status: Home / Self Care | Source: Ambulatory Visit | Attending: Nurse Practitioner | Admitting: Nurse Practitioner

## 2024-03-11 DIAGNOSIS — Z1231 Encounter for screening mammogram for malignant neoplasm of breast: Secondary | ICD-10-CM | POA: Diagnosis not present

## 2024-03-15 ENCOUNTER — Other Ambulatory Visit: Payer: Self-pay | Admitting: Nurse Practitioner

## 2024-03-15 ENCOUNTER — Ambulatory Visit: Payer: 59 | Admitting: Nurse Practitioner

## 2024-03-15 DIAGNOSIS — R928 Other abnormal and inconclusive findings on diagnostic imaging of breast: Secondary | ICD-10-CM

## 2024-03-18 ENCOUNTER — Encounter: Payer: Self-pay | Admitting: Nurse Practitioner

## 2024-03-18 ENCOUNTER — Ambulatory Visit (INDEPENDENT_AMBULATORY_CARE_PROVIDER_SITE_OTHER): Admitting: Nurse Practitioner

## 2024-03-18 VITALS — BP 105/60 | HR 81 | Temp 97.4°F | Ht 64.0 in | Wt 179.0 lb

## 2024-03-18 DIAGNOSIS — E66811 Obesity, class 1: Secondary | ICD-10-CM

## 2024-03-18 DIAGNOSIS — Z0001 Encounter for general adult medical examination with abnormal findings: Secondary | ICD-10-CM | POA: Diagnosis not present

## 2024-03-18 DIAGNOSIS — E8881 Metabolic syndrome: Secondary | ICD-10-CM

## 2024-03-18 DIAGNOSIS — N3281 Overactive bladder: Secondary | ICD-10-CM

## 2024-03-18 DIAGNOSIS — Z0000 Encounter for general adult medical examination without abnormal findings: Secondary | ICD-10-CM

## 2024-03-18 DIAGNOSIS — E7800 Pure hypercholesterolemia, unspecified: Secondary | ICD-10-CM

## 2024-03-18 DIAGNOSIS — I10 Essential (primary) hypertension: Secondary | ICD-10-CM

## 2024-03-18 DIAGNOSIS — Z6833 Body mass index (BMI) 33.0-33.9, adult: Secondary | ICD-10-CM

## 2024-03-18 LAB — BAYER DCA HB A1C WAIVED: HB A1C (BAYER DCA - WAIVED): 6 % — ABNORMAL HIGH (ref 4.8–5.6)

## 2024-03-18 NOTE — Patient Instructions (Signed)

## 2024-03-18 NOTE — Progress Notes (Signed)
 Subjective:    Patient ID: Ariel [name], female    DOB: 1946/09/05, 78 y.o.   MRN: [id number]   Chief Complaint: annual physical    HPI:  Ariel [name] is a 78 y.o. who identifies as a female who was assigned female at birth.   Social history: Lives with: husband Work history: retired   Water engineer in today for follow up of the following chronic medical issues:  1. Primary hypertension No c/o chest pain, sob or headache. Does not check blood pressure at home. BP Readings from Last 3 Encounters:  12/07/23 128/78  09/21/23 97/64  04/20/23 130/72     2. Pure hypercholesterolemia Does try to watch diet and does little to no dedicated exercise. Lab Results  Component Value Date   CHOL 162 09/21/2023   HDL 49 09/21/2023   LDLCALC 84 09/21/2023   TRIG 167 (H) 09/21/2023   CHOLHDL 3.3 09/21/2023   The 10-year ASCVD risk score (Arnett DK, et al., 2019) is: 10.3%   3. Metabolic syndrome Does not check blood sugars every day. Lab Results  Component Value Date   HGBA1C 6.0 (H) 09/21/2023     4. Overactive bladder Myrbetriq  works well to keep symptoms under control  5. Class 1 obesity due to excess calories with serious comorbidity and body mass index (BMI) of 33.0 to 33.9 in adult No recent weight changes  Wt Readings from Last 3 Encounters:  03/18/24 179 lb (81.2 kg)  12/07/23 180 lb (81.6 kg)  09/21/23 180 lb (81.6 kg)   BMI Readings from Last 3 Encounters:  03/18/24 30.73 kg/m  12/07/23 30.90 kg/m  09/21/23 30.90 kg/m      New complaints: None today  No Known Allergies Outpatient Encounter Medications as of 03/18/2024  Medication Sig   levocetirizine (XYZAL ) 5 MG tablet TAKE 1/2 TABLET (2.5 MG TOTAL) BY MOUTH EVERY EVENING.   mirabegron  ER (MYRBETRIQ ) 50 MG TB24 tablet Take 1 tablet (50 mg total) by mouth daily.   PAXLOVID, 300/100, 20 x 150 MG & 10 x 100MG  TBPK Take by mouth.   polyethylene glycol powder (GLYCOLAX /MIRALAX ) 17 GM/SCOOP  powder Take 17 g by mouth daily as needed (constipation).   rosuvastatin  (CRESTOR ) 10 MG tablet Take 1 tablet (10 mg total) by mouth daily.   valsartan -hydrochlorothiazide  (DIOVAN -HCT) 160-25 MG tablet Take 0.5 tablets by mouth daily.   VENTOLIN  HFA 108 (90 Base) MCG/ACT inhaler Inhale 2 puffs into the lungs every 4 (four) hours.   Facility-Administered Encounter Medications as of 03/18/2024  Medication   methylPREDNISolone  acetate (DEPO-MEDROL ) injection 40 mg    Past Surgical History:  Procedure Laterality Date   ABDOMINAL HYSTERECTOMY  10/04/1983   BREAST BIOPSY Right 2010   CARPAL TUNNEL RELEASE Right 10/03/2005   CARPAL TUNNEL RELEASE Left 03/17/2016   Procedure: LEFT CARPAL TUNNEL RELEASE;  Surgeon: Darrin Emerald, MD;  Location: [facility];  Service: Orthopedics;  Laterality: Left;   CLOSED REDUCTION FINGER WITH PERCUTANEOUS PINNING Right 06/20/2019   Procedure: RIGHT SMALL FINGER CLOSED REDUCTION PIN FIXATION;  Surgeon: Brunilda Capra, MD;  Location: [facility];  Service: Orthopedics;  Laterality: Right;    Family History  Problem Relation Age of Onset   Diabetes Mother    Cancer Father 92       colon   Alcohol abuse Brother    Cancer Brother        stomach   Lupus Brother    Breast cancer Neg Hx  Controlled substance contract: n/a     Review of Systems  Constitutional:  Negative for diaphoresis.  Eyes:  Negative for pain.  Respiratory:  Negative for shortness of breath.   Cardiovascular:  Negative for chest pain, palpitations and leg swelling.  Gastrointestinal:  Negative for abdominal pain.  Endocrine: Negative for polydipsia.  Skin:  Negative for rash.  Neurological:  Negative for dizziness, weakness and headaches.  Hematological:  Does not bruise/bleed easily.  All other systems reviewed and are negative.      Objective:   Physical Exam Vitals and nursing note reviewed.  Constitutional:      General: She is not in acute  distress.    Appearance: Normal appearance. She is well-developed.  HENT:     Head: Normocephalic.     Right Ear: Tympanic membrane normal.     Left Ear: Tympanic membrane normal.     Nose: Nose normal.     Mouth/Throat:     Mouth: Mucous membranes are moist.   Eyes:     Pupils: Pupils are equal, round, and reactive to light.   Neck:     Vascular: No carotid bruit or JVD.   Cardiovascular:     Rate and Rhythm: Normal rate and regular rhythm.     Heart sounds: Normal heart sounds.  Pulmonary:     Effort: Pulmonary effort is normal. No respiratory distress.     Breath sounds: Normal breath sounds. No wheezing or rales.  Chest:     Chest wall: No tenderness.  Abdominal:     General: Bowel sounds are normal. There is no distension or abdominal bruit.     Palpations: Abdomen is soft. There is no hepatomegaly, splenomegaly, mass or pulsatile mass.     Tenderness: There is no abdominal tenderness.   Musculoskeletal:        General: Normal range of motion.     Cervical back: Normal range of motion and neck supple.  Lymphadenopathy:     Cervical: No cervical adenopathy.   Skin:    General: Skin is warm and dry.   Neurological:     Mental Status: She is alert and oriented to person, place, and time.     Deep Tendon Reflexes: Reflexes are normal and symmetric.   Psychiatric:        Behavior: Behavior normal.        Thought Content: Thought content normal.        Judgment: Judgment normal.     BP 105/60   Pulse 81   Temp (!) 97.4 F (36.3 C) (Temporal)   Ht 5' 4 (1.626 m)   Wt 179 lb (81.2 kg)   SpO2 96%   BMI 30.73 kg/m    Hgba1c 6.8%     Assessment & Plan:  [name] comes in today with chief complaint of annual physical  Diagnosis and orders addressed:  1. Primary hypertension Low sodium diet - CBC with Differential/Platelet - CMP14+EGFR - valsartan -hydrochlorothiazide  (DIOVAN -HCT) 160-25 MG tablet; Take 0.5 tablets by mouth daily.  Dispense:  90 tablet; Refill: 1  2. Pure hypercholesterolemia Low fat diet - Lipid panel - rosuvastatin  (CRESTOR ) 10 MG tablet; Take 1 tablet (10 mg total) by mouth daily.  Dispense: 90 tablet; Refill: 1  3. Metabolic syndrome Continue to watch carbs in diet - Bayer DCA Hb A1c Waived - Microalbumin / creatinine urine ratio  4. Overactive bladder - mirabegron  ER (MYRBETRIQ ) 50 MG TB24 tablet; Take 1 tablet (50 mg total) by mouth daily.  Dispense: 90 tablet; Refill: 1  5. Class 1 obesity due to excess calories with serious comorbidity and body mass index (BMI) of 33.0 to 33.9 in adult Discussed diet and exercise for person with BMI >25 Will recheck weight in 3-6 months    Labs pending Health Maintenance reviewed Diet and exercise encouraged  Follow up plan: 6 months   Mary-Margaret Gaylyn Keas, FNP

## 2024-03-19 ENCOUNTER — Ambulatory Visit: Payer: Self-pay | Admitting: Nurse Practitioner

## 2024-03-19 LAB — CMP14+EGFR
ALT: 15 IU/L (ref 0–32)
AST: 19 IU/L (ref 0–40)
Albumin: 4.2 g/dL (ref 3.8–4.8)
Alkaline Phosphatase: 87 IU/L (ref 44–121)
BUN/Creatinine Ratio: 23 (ref 12–28)
BUN: 16 mg/dL (ref 8–27)
Bilirubin Total: <0.2 mg/dL (ref 0.0–1.2)
CO2: 22 mmol/L (ref 20–29)
Calcium: 10 mg/dL (ref 8.7–10.3)
Chloride: 104 mmol/L (ref 96–106)
Creatinine, Ser: 0.69 mg/dL (ref 0.57–1.00)
Globulin, Total: 2.7 g/dL (ref 1.5–4.5)
Glucose: 90 mg/dL (ref 70–99)
Potassium: 4.1 mmol/L (ref 3.5–5.2)
Sodium: 141 mmol/L (ref 134–144)
Total Protein: 6.9 g/dL (ref 6.0–8.5)
eGFR: 89 mL/min/{1.73_m2} (ref >59)

## 2024-03-19 LAB — CBC WITH DIFFERENTIAL/PLATELET
Basophils Absolute: 0 10*3/uL (ref 0.0–0.2)
Basos: 1 %
EOS (ABSOLUTE): 0.1 10*3/uL (ref 0.0–0.4)
Eos: 2 %
Hematocrit: 37.3 % (ref 34.0–46.6)
Hemoglobin: 11.9 g/dL (ref 11.1–15.9)
Immature Grans (Abs): 0 10*3/uL (ref 0.0–0.1)
Immature Granulocytes: 0 %
Lymphocytes Absolute: 1.8 10*3/uL (ref 0.7–3.1)
Lymphs: 35 %
MCH: 27.9 pg (ref 26.6–33.0)
MCHC: 31.9 g/dL (ref 31.5–35.7)
MCV: 87 fL (ref 79–97)
Monocytes Absolute: 0.7 10*3/uL (ref 0.1–0.9)
Monocytes: 13 %
Neutrophils Absolute: 2.6 10*3/uL (ref 1.4–7.0)
Neutrophils: 49 %
Platelets: 277 10*3/uL (ref 150–450)
RBC: 4.27 x10E6/uL (ref 3.77–5.28)
RDW: 13 % (ref 11.7–15.4)
WBC: 5.2 10*3/uL (ref 3.4–10.8)

## 2024-03-19 LAB — LIPID PANEL
Chol/HDL Ratio: 3.3 ratio (ref 0.0–4.4)
Cholesterol, Total: 153 mg/dL (ref 100–199)
HDL: 47 mg/dL (ref >39)
LDL Chol Calc (NIH): 81 mg/dL (ref 0–99)
Triglycerides: 144 mg/dL (ref 0–149)
VLDL Cholesterol Cal: 25 mg/dL (ref 5–40)

## 2024-03-19 LAB — VITAMIN D 25 HYDROXY (VIT D DEFICIENCY, FRACTURES): Vit D, 25-Hydroxy: 10.4 ng/mL — ABNORMAL LOW (ref 30.0–100.0)

## 2024-03-19 LAB — THYROID PANEL WITH TSH
Free Thyroxine Index: 1.9 (ref 1.2–4.9)
T3 Uptake Ratio: 29 % (ref 24–39)
T4, Total: 6.4 ug/dL (ref 4.5–12.0)
TSH: 1 u[IU]/mL (ref 0.450–4.500)

## 2024-03-25 ENCOUNTER — Telehealth: Payer: Self-pay

## 2024-03-25 NOTE — Telephone Encounter (Signed)
 Left message making pt aware that based off of her last blood work Ronal Quant that she needs to take Vit D 1000iu daily. Advised pt to call back if needed or the pharmacy could help her as well.

## 2024-03-25 NOTE — Telephone Encounter (Signed)
 Copied from CRM [id number]. Topic: Clinical - Medication Question >> Mar 25, 2024  2:21 PM Yolanda T wrote: Reason for CRM: patient called to see if there was a medication that provider wanted her to take and the name. Please f/u with patient

## 2024-03-26 ENCOUNTER — Telehealth: Payer: Self-pay

## 2024-03-26 NOTE — Telephone Encounter (Signed)
 Copied from CRM [id number]. Topic: Clinical - Lab/Test Results >> Mar 26, 2024  4:16 PM Mercer PEDLAR wrote: Reason for CRM: Patient would like a callback to discuss mammogram results.

## 2024-03-27 NOTE — Telephone Encounter (Signed)
 Pt aware the mammogram was incomplete and just needed additional imaging.

## 2024-03-28 ENCOUNTER — Ambulatory Visit
Admission: RE | Admit: 2024-03-28 | Discharge: 2024-03-28 | Disposition: A | Discharge status: Home / Self Care | Source: Ambulatory Visit | Attending: Nurse Practitioner | Admitting: Nurse Practitioner

## 2024-03-28 ENCOUNTER — Ambulatory Visit

## 2024-03-28 DIAGNOSIS — R928 Other abnormal and inconclusive findings on diagnostic imaging of breast: Secondary | ICD-10-CM

## 2024-04-04 DIAGNOSIS — H5213 Myopia, bilateral: Secondary | ICD-10-CM | POA: Diagnosis not present

## 2024-05-09 ENCOUNTER — Telehealth: Payer: Self-pay | Admitting: *Deleted

## 2024-05-09 MED ORDER — NAPROXEN 500 MG PO TABS: 500.0000 mg | ORAL_TABLET | Freq: Two times a day (BID) | ORAL | 1 refills | Status: DC

## 2024-05-09 NOTE — Telephone Encounter (Signed)
 TC to pt she is requesting RF on medication for L shoulder pain/arthritis, just hurting no injury, she just takes it as she needs it. It is not on her current med list, it is under history as last refilled in 2022. Last OV 03/18/24 Next OV 09/19/24 Please advise and if appropriate send refill to [facility]

## 2024-05-09 NOTE — Telephone Encounter (Signed)
 Copied from CRM #[id number]. Topic: Clinical - Medication Question >> May 09, 2024  3:08 PM Carlatta H wrote: Reason for CRM: Patient is requesting a prescription be written for 800mg  ibprofen//Please call when prescription is sent//

## 2024-05-09 NOTE — Telephone Encounter (Signed)
 Was on naproxen  in the past Lab Results  Component Value Date   CREATININE 0.69 03/18/2024   Meds ordered this encounter  Medications   naproxen  (NAPROSYN ) 500 MG tablet    Sig: Take 1 tablet (500 mg total) by mouth 2 (two) times daily with a meal.    Dispense:  60 tablet    Refill:  1    Supervising Provider:   MARYANNE CHEW A [[id number]]   Mary-Margaret Gladis, FNP

## 2024-05-09 NOTE — Addendum Note (Signed)
 Addended by: Shameka Aggarwal, MARY-MARGARET on: 05/09/2024 04:08 PM   Modules accepted: Orders

## 2024-06-11 DIAGNOSIS — H524 Presbyopia: Secondary | ICD-10-CM | POA: Diagnosis not present

## 2024-06-11 DIAGNOSIS — H52223 Regular astigmatism, bilateral: Secondary | ICD-10-CM | POA: Diagnosis not present

## 2024-06-20 ENCOUNTER — Ambulatory Visit: Admitting: Family

## 2024-06-25 ENCOUNTER — Encounter: Payer: Self-pay | Admitting: Nurse Practitioner

## 2024-06-25 ENCOUNTER — Ambulatory Visit: Payer: Self-pay | Admitting: Nurse Practitioner

## 2024-06-25 ENCOUNTER — Ambulatory Visit (INDEPENDENT_AMBULATORY_CARE_PROVIDER_SITE_OTHER): Admitting: Nurse Practitioner

## 2024-06-25 VITALS — BP 116/68 | HR 83 | Temp 97.5°F | Ht 64.0 in | Wt 174.0 lb

## 2024-06-25 DIAGNOSIS — M25512 Pain in left shoulder: Secondary | ICD-10-CM

## 2024-06-25 DIAGNOSIS — N3000 Acute cystitis without hematuria: Secondary | ICD-10-CM

## 2024-06-25 DIAGNOSIS — I10 Essential (primary) hypertension: Secondary | ICD-10-CM

## 2024-06-25 DIAGNOSIS — G8929 Other chronic pain: Secondary | ICD-10-CM

## 2024-06-25 DIAGNOSIS — R300 Dysuria: Secondary | ICD-10-CM | POA: Diagnosis not present

## 2024-06-25 LAB — URINALYSIS, ROUTINE W REFLEX MICROSCOPIC
Bilirubin, UA: NEGATIVE
Glucose, UA: NEGATIVE
Ketones, UA: NEGATIVE
Nitrite, UA: POSITIVE — AB
Protein,UA: NEGATIVE
Specific Gravity, UA: 1.015 (ref 1.005–1.030)
Urobilinogen, Ur: 0.2 mg/dL (ref 0.2–1.0)
pH, UA: 5.5 (ref 5.0–7.5)

## 2024-06-25 LAB — MICROSCOPIC EXAMINATION
Renal Epithel, UA: NONE SEEN /HPF
Yeast, UA: NONE SEEN

## 2024-06-25 MED ORDER — VALSARTAN-HYDROCHLOROTHIAZIDE 160-25 MG PO TABS
0.5000 | ORAL_TABLET | Freq: Every day | ORAL | 0 refills | Status: AC
Start: 2024-06-25 — End: ?

## 2024-06-25 MED ORDER — SULFAMETHOXAZOLE-TRIMETHOPRIM 800-160 MG PO TABS: 1.0000 | ORAL_TABLET | Freq: Two times a day (BID) | ORAL | 0 refills | Status: DC

## 2024-06-25 NOTE — Progress Notes (Signed)
 Subjective:    Patient ID: Ariel [name], female    DOB: 1946/04/17, 78 y.o.   MRN: [id number]   Chief Complaint: urinary frequency and left shoulder pain  Urinary Frequency  This is a new problem. The current episode started in the past 7 days. The problem has been waxing and waning. The quality of the pain is described as burning. The pain is at a severity of 5/10. The pain is mild. There has been no fever. She is Not sexually active. There is No history of pyelonephritis. Associated symptoms include frequency, hesitancy and urgency.  Shoulder Pain  The pain is present in the left shoulder. This is a new problem. The current episode started more than 1 year ago. The problem occurs intermittently. The problem has been waxing and waning. The quality of the pain is described as pounding and aching. The pain is at a severity of 8/10. The pain is mild. Treatments tried: seeing ortho and getting injections. The treatment provided mild relief.     Patient Active Problem List   Diagnosis Date Noted   Overactive bladder 07/03/2018   Carpal tunnel syndrome, left    HTN (hypertension) 03/20/2013   HLD (hyperlipidemia) 03/20/2013   Metabolic syndrome 03/20/2013   Obesity 03/20/2013       Review of Systems  Genitourinary:  Positive for frequency, hesitancy and urgency.  Musculoskeletal:  Positive for arthralgias (left shoulder).       Objective:   Physical Exam Constitutional:      Appearance: Normal appearance.  Cardiovascular:     Rate and Rhythm: Normal rate and regular rhythm.     Heart sounds: Normal heart sounds.  Pulmonary:     Effort: Pulmonary effort is normal.     Breath sounds: Normal breath sounds.  Musculoskeletal:     Comments: FROM of left shoulder with pain on extension and internal rotation  Neurological:     General: No focal deficit present.     Mental Status: She is alert and oriented to person, place, and time.  Psychiatric:        Mood and Affect:  Mood normal.        Behavior: Behavior normal.    BP 116/68   Pulse 83   Temp (!) 97.5 F (36.4 C) (Temporal)   Ht 5' 4 (1.626 m)   Wt 174 lb (78.9 kg)   SpO2 97%   BMI 29.87 kg/m         Assessment & Plan:   Ariel [name] in today with chief complaint of Urinary Frequency   1. Dysuria (Primary) - Urinalysis, Routine w reflex microscopic - Urine Culture  2. Acute cystitis without hematuria Take medication as prescribe Cotton underwear Take shower not bath Cranberry juice, yogurt Force fluids AZO over the counter X2 days Culture pending RTO prn  - sulfamethoxazole -trimethoprim  (BACTRIM  DS) 800-160 MG tablet; Take 1 tablet by mouth 2 (two) times daily.  Dispense: 20 tablet; Refill: 0  3. Chronic left shoulder pain Keep follow up with ortho Naprosyn  as previously prescribed  4. Primary hypertension Refilled meds - valsartan -hydrochlorothiazide  (DIOVAN -HCT) 160-25 MG tablet; Take 0.5 tablets by mouth daily.  Dispense: 45 tablet; Refill: 0    The above assessment and management plan was discussed with the patient. The patient verbalized understanding of and has agreed to the management plan. Patient is aware to call the clinic if symptoms persist or worsen. Patient is aware when to return to the clinic for a follow-up visit. Patient  educated on when it is appropriate to go to the emergency department.   Mary-Margaret Gladis, FNP

## 2024-06-25 NOTE — Patient Instructions (Signed)
 Shoulder Pain  Many things can cause shoulder pain, including:  An injury.  Moving the shoulder in the same way again and again (overuse).  Joint pain (arthritis).  Pain can come from:  Swelling and irritation (inflammation) of any part of the shoulder.  An injury to:  The shoulder joint.  Tissues that connect muscle to bone (tendons).  Tissues that connect bones to each other (ligaments).  Bones.  Follow these instructions at home:  Watch for changes in your symptoms. Let your doctor know about them. Follow these instructions to help with your pain.  If you have a sling that can be taken off:  Wear the sling as told by your doctor. Take it off only as told by your doctor.  Check the skin around the sling every day. Tell your doctor if you see problems.  Loosen the sling if your fingers:  Tingle.  Become numb.  Become cold.  Keep the sling clean.  If the sling is not waterproof:  Do not let it get wet.  Take the sling off when you shower or bathe.  Managing pain, stiffness, and swelling    If told, put ice on the painful area.  Put ice in a plastic bag.  Place a towel between your skin and the bag.  Leave the ice on for 20 minutes, 2-3 times a day. Stop putting ice on if it does not help with the pain.  If your skin turns bright red, take off the ice right away to prevent skin damage. The risk of damage is higher if you cannot feel pain, heat, or cold.  Squeeze a soft ball or a foam pad as much as possible. This prevents swelling in the shoulder. It also helps to strengthen the arm.  General instructions  Take over-the-counter and prescription medicines only as told by your doctor.  Keep all follow-up visits. This will help you avoid any type of permanent shoulder problems.  Contact a doctor if:  Your pain gets worse.  Medicine does not help your pain.  You have new pain in your arm, hand, or fingers.  You loosen your sling and your arm, hand, or fingers:  Tingle.  Are numb.  Are swollen.  Get help right away  if:  Your arm, hand, or fingers turn white or blue.  This information is not intended to replace advice given to you by your health care provider. Make sure you discuss any questions you have with your health care provider.  Document Revised: 04/22/2022 Document Reviewed: 04/22/2022  Elsevier Patient Education  2024 [employer].

## 2024-06-26 ENCOUNTER — Telehealth: Payer: Self-pay

## 2024-06-26 NOTE — Telephone Encounter (Signed)
 CALLED PATIENT, NO ANSWER, NO OPTION TO LEAVE VOICE MESSAGE

## 2024-06-26 NOTE — Telephone Encounter (Signed)
 Copied from CRM #[id number]. Topic: Clinical - Medical Advice >> Jun 26, 2024 10:42 AM Mia F wrote: Reason for CRM: Pt said she came in yesterday and was diagnosed with a UTI. Pt would like to know if she could have sex while taking the medication or if she should wait. Please advise. Pt would like to be called

## 2024-06-27 ENCOUNTER — Ambulatory Visit: Payer: Self-pay

## 2024-06-27 NOTE — Telephone Encounter (Deleted)
 Appointment  made for 10:10 am. Patient aware

## 2024-06-27 NOTE — Telephone Encounter (Signed)
 FYI Only or Action Required?: FYI only for provider.  Patient was last seen in primary care on 06/25/2024 by Gladis Mustard, FNP.  Called Nurse Triage reporting Urinary Tract Infection and Patient Education.   Triage Disposition: Home Care  Patient/caregiver understands and will follow disposition?: Yes          Copied from CRM #[id number]. Topic: Clinical - Medical Advice >> Jun 26, 2024 10:42 AM Mia F wrote: Reason for CRM: Pt said she came in yesterday and was diagnosed with a UTI. Pt would like to know if she could have sex while taking the medication or if she should wait. Please advise. Pt would like to be called >> Jun 27, 2024 11:15 AM Myrick T wrote: Patient returning NT call >> Jun 26, 2024  3:52 PM Antwanette L wrote: The patient is calling back to inquire whether it is safe to have sex while taking medication for a uti Reason for Disposition  Preventing urinary tract infections (UTIs), questions about  Protocols used: Urinary Tract Infection on Antibiotic Follow-up Call - [facility]

## 2024-06-28 ENCOUNTER — Ambulatory Visit: Admitting: Nurse Practitioner

## 2024-06-28 ENCOUNTER — Other Ambulatory Visit: Payer: Self-pay | Admitting: Nurse Practitioner

## 2024-06-28 DIAGNOSIS — E7800 Pure hypercholesterolemia, unspecified: Secondary | ICD-10-CM

## 2024-06-30 LAB — URINE CULTURE

## 2024-07-02 NOTE — Telephone Encounter (Signed)
 I called and spoke with patient and she confirmed that someone called and spoke with her about this already. No other questions at this time.

## 2024-07-04 ENCOUNTER — Ambulatory Visit

## 2024-07-04 VITALS — BP 116/68 | HR 83 | Ht 64.0 in | Wt 174.0 lb

## 2024-07-04 DIAGNOSIS — Z0000 Encounter for general adult medical examination without abnormal findings: Secondary | ICD-10-CM

## 2024-07-04 NOTE — Progress Notes (Signed)
 Subjective:   Ariel [name] is a 78 y.o. who presents for a Medicare Wellness preventive visit.  As a reminder, Annual Wellness Visits don't include a physical exam, and some assessments may be limited, especially if this visit is performed virtually. We may recommend an in-person follow-up visit with your provider if needed.  Visit Complete: Virtual I connected with  Ariel [name] on 07/04/24 by a audio enabled telemedicine application and verified that I am speaking with the correct person using two identifiers.  Patient Location: Home  Provider Location: Home Office  I discussed the limitations of evaluation and management by telemedicine. The patient expressed understanding and agreed to proceed.  Vital Signs: Because this visit was a virtual/telehealth visit, some criteria may be missing or patient reported. Any vitals not documented were not able to be obtained and vitals that have been documented are patient reported.  VideoDeclined- This patient declined Librarian, academic. Therefore the visit was completed with audio only.  Persons Participating in Visit: Patient.  AWV Questionnaire: No: Patient Medicare AWV questionnaire was not completed prior to this visit.  Cardiac Risk Factors include: advanced age (>108men, >59 women);dyslipidemia;hypertension;obesity (BMI >30kg/m2)     Objective:    Today's Vitals   07/04/24 0848  BP: 116/68  Pulse: 83  Weight: 174 lb (78.9 kg)  Height: 5' 4 (1.626 m)   Body mass index is 29.87 kg/m.     07/04/2024    8:43 AM 02/14/2023   10:36 AM 03/15/2022    6:50 PM 02/11/2022   10:07 AM 02/10/2021   12:36 PM 06/20/2019    9:27 AM 06/14/2019   11:13 AM  Advanced Directives  Does Patient Have a Medical Advance Directive? No No Yes No No Yes No  Type of Dispensing optician of [place];Living will   Does patient want to make changes to medical advance directive?      No - Patient  declined   Copy of Healthcare Power of Attorney in Chart?      No - copy requested   Would patient like information on creating a medical advance directive?  No - Patient declined  No - Patient declined No - Patient declined  No - Patient declined    Current Medications (verified) Outpatient Encounter Medications as of 07/04/2024  Medication Sig   levocetirizine (XYZAL ) 5 MG tablet TAKE 1/2 TABLET (2.5 MG TOTAL) BY MOUTH EVERY EVENING.   mirabegron  ER (MYRBETRIQ ) 50 MG TB24 tablet Take 1 tablet (50 mg total) by mouth daily.   naproxen  (NAPROSYN ) 500 MG tablet Take 1 tablet (500 mg total) by mouth 2 (two) times daily with a meal.   PAXLOVID, 300/100, 20 x 150 MG & 10 x 100MG  TBPK Take by mouth.   polyethylene glycol powder (GLYCOLAX /MIRALAX ) 17 GM/SCOOP powder Take 17 g by mouth daily as needed (constipation).   rosuvastatin  (CRESTOR ) 10 MG tablet TAKE 1 TABLET BY MOUTH EVERY DAY   sulfamethoxazole -trimethoprim  (BACTRIM  DS) 800-160 MG tablet Take 1 tablet by mouth 2 (two) times daily.   valsartan -hydrochlorothiazide  (DIOVAN -HCT) 160-25 MG tablet Take 0.5 tablets by mouth daily.   VENTOLIN  HFA 108 (90 Base) MCG/ACT inhaler Inhale 2 puffs into the lungs every 4 (four) hours.   Facility-Administered Encounter Medications as of 07/04/2024  Medication   methylPREDNISolone  acetate (DEPO-MEDROL ) injection 40 mg    Allergies (verified) Patient has no known allergies.   History: Past Medical History:  Diagnosis Date   Arthritis  Diverticulosis    Hyperlipidemia    Hypertension    Osteopenia    Past Surgical History:  Procedure Laterality Date   ABDOMINAL HYSTERECTOMY  10/04/1983   BREAST BIOPSY Right 2010   CARPAL TUNNEL RELEASE Right 10/03/2005   CARPAL TUNNEL RELEASE Left 03/17/2016   Procedure: LEFT CARPAL TUNNEL RELEASE;  Surgeon: Taft FORBES Minerva, MD;  Location: [facility];  Service: Orthopedics;  Laterality: Left;   CLOSED REDUCTION FINGER WITH PERCUTANEOUS PINNING Right  06/20/2019   Procedure: RIGHT SMALL FINGER CLOSED REDUCTION PIN FIXATION;  Surgeon: Murrell Drivers, MD;  Location: [facility];  Service: Orthopedics;  Laterality: Right;   Family History  Problem Relation Age of Onset   Diabetes Mother    Cancer Father 93       colon   Alcohol abuse Brother    Cancer Brother        stomach   Lupus Brother    Breast cancer Neg Hx    Social History   Socioeconomic History   Marital status: Married    Spouse name: Darin   Number of children: 1   Years of education: 12   Highest education level: 12th grade  Occupational History   Occupation: Retired    Comment: Retired from the [employer]  Tobacco Use   Smoking status: Never   Smokeless tobacco: Never  Vaping Use   Vaping status: Never Used  Substance and Sexual Activity   Alcohol use: Yes    Comment: social   Drug use: No   Sexual activity: Yes    Birth control/protection: Surgical  Other Topics Concern   Not on file  Social History Narrative   Lives with new husband  - recently re-married 01/2021. One level living   Social Drivers of Corporate investment banker Strain: Low Risk  (07/04/2024)   Overall Financial Resource Strain (CARDIA)    Difficulty of Paying Living Expenses: Not hard at all  Food Insecurity: No Food Insecurity (07/04/2024)   Hunger Vital Sign    Worried About Running Out of Food in the Last Year: Never true    Ran Out of Food in the Last Year: Never true  Transportation Needs: No Transportation Needs (07/04/2024)   PRAPARE - Administrator, Civil Service (Medical): No    Lack of Transportation (Non-Medical): No  Physical Activity: Insufficiently Active (07/04/2024)   Exercise Vital Sign    Days of Exercise per Week: 3 days    Minutes of Exercise per Session: 30 min  Stress: No Stress Concern Present (07/04/2024)   [employer] of Occupational Health - Occupational Stress Questionnaire    Feeling of Stress: Not at all  Social Connections:  Moderately Integrated (07/04/2024)   Social Connection and Isolation Panel    Frequency of Communication with Friends and Family: More than three times a week    Frequency of Social Gatherings with Friends and Family: More than three times a week    Attends Religious Services: More than 4 times per year    Active Member of [employer] or Organizations: No    Attends Banker Meetings: Never    Marital Status: Married    Tobacco Counseling Counseling given: Yes    Clinical Intake:  Pre-visit preparation completed: Yes  Pain : No/denies pain     BMI - recorded: 29.87 Nutritional Status: BMI 25 -29 Overweight Nutritional Risks: None Diabetes: No  Lab Results  Component Value Date   HGBA1C 6.0 (H) 03/18/2024  HGBA1C 6.0 (H) 09/21/2023   HGBA1C 6.0 (H) 03/14/2023     How often do you need to have someone help you when you read instructions, pamphlets, or other written materials from your doctor or pharmacy?: 1 - Never  Interpreter Needed?: No  Information entered by :: alia t/cma   Activities of Daily Living     07/04/2024    8:42 AM  In your present state of health, do you have any difficulty performing the following activities:  Hearing? 1  Vision? 0  Difficulty concentrating or making decisions? 0  Walking or climbing stairs? 0  Dressing or bathing? 0  Doing errands, shopping? 1  Comment provided through health ins for transportation  Preparing Food and eating ? N  Using the Toilet? N  In the past six months, have you accidently leaked urine? N  Do you have problems with loss of bowel control? N  Managing your Medications? N  Managing your Finances? N  Housekeeping or managing your Housekeeping? N    Patient Care Team: Gladis Mustard, FNP as PCP - General (Nurse Practitioner) Margrette Taft BRAVO, MD as Consulting Physician (Orthopedic Surgery)  I have updated your Care Teams any recent Medical Services you may have received from other  providers in the past year.     Assessment:   This is a routine wellness examination for Dahlila.  Hearing/Vision screen Hearing Screening - Comments:: Pt use hearing aids Vision Screening - Comments:: Pt wear glasses for reading/pt [facility] In [place]/last ov 2025   Goals Addressed             This Visit's Progress    Exercise 3x per week (30 min per time)   On track    Try to walk for 30 minutes 3 times per week       Depression Screen     07/04/2024    8:45 AM 06/25/2024    9:12 AM 03/18/2024   11:22 AM 09/21/2023    3:13 PM 04/20/2023    8:47 AM 03/14/2023    3:28 PM 02/14/2023   10:35 AM  PHQ 2/9 Scores  PHQ - 2 Score 0 0 0 0 0 0 0  PHQ- 9 Score      0 0    Fall Risk     06/25/2024    9:17 AM 06/25/2024    9:12 AM 03/18/2024   11:22 AM 09/21/2023    3:13 PM 03/14/2023    3:28 PM  Fall Risk   Falls in the past year? 0 0 0 0 1  Number falls in past yr:     0  Injury with Fall?     0  Risk for fall due to :     History of fall(s)  Follow up     Education provided    MEDICARE RISK AT HOME:  Medicare Risk at Home Any stairs in or around the home?: No If so, are there any without handrails?: No Home free of loose throw rugs in walkways, pet beds, electrical cords, etc?: Yes Adequate lighting in your home to reduce risk of falls?: Yes Life alert?: No Use of a cane, walker or w/c?: No Grab bars in the bathroom?: No Shower chair or bench in shower?: No Elevated toilet seat or a handicapped toilet?: No  TIMED UP AND GO:  Was the test performed?  no  Cognitive Function: 6CIT completed    05/11/2018   10:36 AM 10/13/2016    1:50 PM  MMSE - Mini Mental State Exam  Orientation to time 5 5   Orientation to Place 5 4   Registration 3 3   Attention/ Calculation 5 5   Recall 3 3   Language- name 2 objects 2 2   Language- repeat 1 1  Language- follow 3 step command 3 3   Language- read & follow direction 1 0   Write a sentence 1 1   Copy design 1 0    Total score 30 27      Data saved with a previous flowsheet row definition        07/04/2024    8:43 AM 02/14/2023   10:36 AM 02/11/2022   10:08 AM 05/22/2019    3:35 PM  6CIT Screen  What Year? 0 points 0 points 0 points 0 points  What month? 0 points 0 points 0 points 0 points  What time? 0 points 0 points 0 points 0 points  Count back from 20 0 points 0 points 0 points 0 points  Months in reverse 0 points 0 points 2 points 0 points  Repeat phrase 0 points 0 points 2 points 0 points  Total Score 0 points 0 points 4 points 0 points    Immunizations Immunization History  Administered Date(s) Administered   INFLUENZA, HIGH DOSE SEASONAL PF 07/03/2018   Influenza Whole 08/03/2012   Moderna Sars-Covid-2 Vaccination 12/03/2019, 12/31/2019, 08/04/2020, 06/22/2021   Pneumococcal Conjugate-13 12/08/2014   Pneumococcal Polysaccharide-23 05/18/2016   Tdap 10/13/2016   Zoster, Live 04/29/2014    Screening Tests Health Maintenance  Topic Date Due   COVID-19 Vaccine (5 - 2025-26 season) 07/11/2024 (Originally 06/03/2024)   DEXA SCAN  09/20/2024 (Originally 02/17/2023)   Colonoscopy  09/20/2024 (Originally 07/29/2023)   Zoster Vaccines- Shingrix (1 of 2) 09/24/2024 (Originally 10/05/1996)   Influenza Vaccine  12/31/2024 (Originally 05/03/2024)   Mammogram  03/11/2025   Medicare Annual Wellness (AWV)  07/04/2025   DTaP/Tdap/Td (2 - Td or Tdap) 10/13/2026   Pneumococcal Vaccine: 50+ Years  Completed   Hepatitis C Screening  Completed   HPV VACCINES  Aged Out   Meningococcal B Vaccine  Aged Out    Health Maintenance Items Addressed: See Nurse Notes at the end of this note  Additional Screening:  Vision Screening: Recommended annual ophthalmology exams for early detection of glaucoma and other disorders of the eye. Is the patient up to date with their annual eye exam?  Yes  Who is the provider or what is the name of the office in which the patient attends annual eye exams? [facility]  Dental Screening: Recommended annual dental exams for proper oral hygiene  Community Resource Referral / Chronic Care Management: CRR required this visit?  No   CCM required this visit?  No   Plan:    I have personally reviewed and noted the following in the patient's chart:   Medical and social history Use of alcohol, tobacco or illicit drugs  Current medications and supplements including opioid prescriptions. Patient is not currently taking opioid prescriptions. Functional ability and status Nutritional status Physical activity Advanced directives List of other physicians Hospitalizations, surgeries, and ER visits in previous 12 months Vitals Screenings to include cognitive, depression, and falls Referrals and appointments  In addition, I have reviewed and discussed with patient certain preventive protocols, quality metrics, and best practice recommendations. A written personalized care plan for preventive services as well as general preventive health recommendations were provided to patient.   Trayce Caravello  Debby, [facility]   07/04/2024   After Visit Summary: (MyChart) Due to this being a telephonic visit, the after visit summary with patients personalized plan was offered to patient via MyChart   Notes: Nothing significant to report at this time.

## 2024-07-04 NOTE — Patient Instructions (Signed)
 [name],  Thank you for taking the time for your Medicare Wellness Visit. I appreciate your continued commitment to your health goals. Please review the care plan we discussed, and feel free to reach out if I can assist you further.  Medicare recommends these wellness visits once per year to help you and your care team stay ahead of potential health issues. These visits are designed to focus on prevention, allowing your provider to concentrate on managing your acute and chronic conditions during your regular appointments.  Please note that Annual Wellness Visits do not include a physical exam. Some assessments may be limited, especially if the visit was conducted virtually. If needed, we may recommend a separate in-person follow-up with your provider.  Ongoing Care Seeing your primary care provider every 3 to 6 months helps us  monitor your health and provide consistent, personalized care.   Referrals If a referral was made during today's visit and you haven't received any updates within two weeks, please contact the referred provider directly to check on the status.  Recommended Screenings:  Health Maintenance  Topic Date Due   Medicare Annual Wellness Visit  02/14/2024   COVID-19 Vaccine (5 - 2025-26 season) 07/11/2024*   DEXA scan (bone density measurement)  09/20/2024*   Colon Cancer Screening  09/20/2024*   Zoster (Shingles) Vaccine (1 of 2) 09/24/2024*   Flu Shot  12/31/2024*   Breast Cancer Screening  03/11/2025   DTaP/Tdap/Td vaccine (2 - Td or Tdap) 10/13/2026   Pneumococcal Vaccine for age over 49  Completed   Hepatitis C Screening  Completed   HPV Vaccine  Aged Out   Meningitis B Vaccine  Aged Out  *Topic was postponed. The date shown is not the original due date.       07/04/2024    8:43 AM  Advanced Directives  Does Patient Have a Medical Advance Directive? No   Advance Care Planning is important because it: Ensures you receive medical care that aligns with  your values, goals, and preferences. Provides guidance to your family and loved ones, reducing the emotional burden of decision-making during critical moments.  Vision: Annual vision screenings are recommended for early detection of glaucoma, cataracts, and diabetic retinopathy. These exams can also reveal signs of chronic conditions such as diabetes and high blood pressure.  Dental: Annual dental screenings help detect early signs of oral cancer, gum disease, and other conditions linked to overall health, including heart disease and diabetes.  Please see the attached documents for additional preventive care recommendations.

## 2024-07-21 ENCOUNTER — Other Ambulatory Visit: Payer: Self-pay | Admitting: Nurse Practitioner

## 2024-08-05 ENCOUNTER — Encounter: Payer: Self-pay | Admitting: Radiology

## 2024-08-12 NOTE — Progress Notes (Signed)
 Pharmacy Quality Measure Review  This patient is appearing on a report for being at risk of failing the adherence measure for cholesterol (statin) medications this calendar year.   Medication: rosuvastatin  10 mg  Last fill date: 08/05/24 for 90 day supply  Insurance report was not up to date. No action needed at this time. No refills remaining on prescription, but has follow up with provider prior to running out of medication.   Woodie Jock, PharmD PGY1 Pharmacy Resident  08/12/2024

## 2024-09-19 ENCOUNTER — Encounter: Payer: Self-pay | Admitting: Nurse Practitioner

## 2024-09-19 ENCOUNTER — Ambulatory Visit: Payer: Self-pay | Admitting: Nurse Practitioner

## 2024-09-19 VITALS — BP 118/74 | HR 78 | Temp 97.1°F | Ht 64.0 in | Wt 173.0 lb

## 2024-09-19 DIAGNOSIS — E7800 Pure hypercholesterolemia, unspecified: Secondary | ICD-10-CM

## 2024-09-19 DIAGNOSIS — R0989 Other specified symptoms and signs involving the circulatory and respiratory systems: Secondary | ICD-10-CM | POA: Diagnosis not present

## 2024-09-19 DIAGNOSIS — Z6833 Body mass index (BMI) 33.0-33.9, adult: Secondary | ICD-10-CM

## 2024-09-19 DIAGNOSIS — N3281 Overactive bladder: Secondary | ICD-10-CM | POA: Diagnosis not present

## 2024-09-19 DIAGNOSIS — E66811 Obesity, class 1: Secondary | ICD-10-CM

## 2024-09-19 DIAGNOSIS — E8881 Metabolic syndrome: Secondary | ICD-10-CM

## 2024-09-19 DIAGNOSIS — I10 Essential (primary) hypertension: Secondary | ICD-10-CM

## 2024-09-19 DIAGNOSIS — E6609 Other obesity due to excess calories: Secondary | ICD-10-CM | POA: Diagnosis not present

## 2024-09-19 LAB — CBC WITH DIFFERENTIAL/PLATELET
Basophils Absolute: 0.1 x10E3/uL (ref 0.0–0.2)
Basos: 1 %
EOS (ABSOLUTE): 0.1 x10E3/uL (ref 0.0–0.4)
Eos: 1 %
Hematocrit: 37.7 % (ref 34.0–46.6)
Hemoglobin: 11.9 g/dL (ref 11.1–15.9)
Immature Grans (Abs): 0 x10E3/uL (ref 0.0–0.1)
Immature Granulocytes: 0 %
Lymphocytes Absolute: 2.2 x10E3/uL (ref 0.7–3.1)
Lymphs: 41 %
MCH: 27.8 pg (ref 26.6–33.0)
MCHC: 31.6 g/dL (ref 31.5–35.7)
MCV: 88 fL (ref 79–97)
Monocytes Absolute: 0.6 x10E3/uL (ref 0.1–0.9)
Monocytes: 11 %
Neutrophils Absolute: 2.4 x10E3/uL (ref 1.4–7.0)
Neutrophils: 46 %
Platelets: 263 x10E3/uL (ref 150–450)
RBC: 4.28 x10E6/uL (ref 3.77–5.28)
RDW: 13.2 % (ref 11.7–15.4)
WBC: 5.2 x10E3/uL (ref 3.4–10.8)

## 2024-09-19 LAB — CMP14+EGFR
ALT: 17 IU/L (ref 0–32)
AST: 20 IU/L (ref 0–40)
Albumin: 4.3 g/dL (ref 3.8–4.8)
Alkaline Phosphatase: 72 IU/L (ref 49–135)
BUN/Creatinine Ratio: 17 (ref 12–28)
BUN: 11 mg/dL (ref 8–27)
Bilirubin Total: 0.3 mg/dL (ref 0.0–1.2)
CO2: 23 mmol/L (ref 20–29)
Calcium: 10.3 mg/dL (ref 8.7–10.3)
Chloride: 102 mmol/L (ref 96–106)
Creatinine, Ser: 0.63 mg/dL (ref 0.57–1.00)
Globulin, Total: 2.6 g/dL (ref 1.5–4.5)
Glucose: 103 mg/dL — ABNORMAL HIGH (ref 70–99)
Potassium: 4.1 mmol/L (ref 3.5–5.2)
Sodium: 141 mmol/L (ref 134–144)
Total Protein: 6.9 g/dL (ref 6.0–8.5)
eGFR: 91 mL/min/1.73 (ref >59)

## 2024-09-19 LAB — LIPID PANEL
Chol/HDL Ratio: 2.8 ratio (ref 0.0–4.4)
Cholesterol, Total: 152 mg/dL (ref 100–199)
HDL: 54 mg/dL (ref >39)
LDL Chol Calc (NIH): 84 mg/dL (ref 0–99)
Triglycerides: 74 mg/dL (ref 0–149)
VLDL Cholesterol Cal: 14 mg/dL (ref 5–40)

## 2024-09-19 LAB — BAYER DCA HB A1C WAIVED: HB A1C (BAYER DCA - WAIVED): 5.4 % (ref 4.8–5.6)

## 2024-09-19 MED ORDER — VALSARTAN-HYDROCHLOROTHIAZIDE 160-25 MG PO TABS: 0.5000 | ORAL_TABLET | Freq: Every day | ORAL | 1 refills | Status: AC

## 2024-09-19 MED ORDER — ROSUVASTATIN CALCIUM 10 MG PO TABS: 10.0000 mg | ORAL_TABLET | Freq: Every day | ORAL | 1 refills | Status: AC

## 2024-09-19 NOTE — Patient Instructions (Signed)
 Fall Prevention in the Home, Adult Falls can cause injuries and can happen to people of all ages. There are many things you can do to make your home safer and to help prevent falls. What actions can I take to prevent falls? General information Use good lighting in all rooms. Make sure to: Replace any light bulbs that burn out. Turn on the lights in dark areas and use night-lights. Keep items that you use often in easy-to-reach places. Lower the shelves around your home if needed. Move furniture so that there are clear paths around it. Do not use throw rugs or other things on the floor that can make you trip. If any of your floors are uneven, fix them. Add color or contrast paint or tape to clearly mark and help you see: Grab bars or handrails. First and last steps of staircases. Where the edge of each step is. If you use a ladder or stepladder: Make sure that it is fully opened. Do not climb a closed ladder. Make sure the sides of the ladder are locked in place. Have someone hold the ladder while you use it. Know where your pets are as you move through your home. What can I do in the bathroom?     Keep the floor dry. Clean up any water on the floor right away. Remove soap buildup in the bathtub or shower. Buildup makes bathtubs and showers slippery. Use non-skid mats or decals on the floor of the bathtub or shower. Attach bath mats securely with double-sided, non-slip rug tape. If you need to sit down in the shower, use a non-slip stool. Install grab bars by the toilet and in the bathtub and shower. Do not use towel bars as grab bars. What can I do in the bedroom? Make sure that you have a light by your bed that is easy to reach. Do not use any sheets or blankets on your bed that hang to the floor. Have a firm chair or bench with side arms that you can use for support when you get dressed. What can I do in the kitchen? Clean up any spills right away. If you need to reach something  above you, use a step stool with a grab bar. Keep electrical cords out of the way. Do not use floor polish or wax that makes floors slippery. What can I do with my stairs? Do not leave anything on the stairs. Make sure that you have a light switch at the top and the bottom of the stairs. Make sure that there are handrails on both sides of the stairs. Fix handrails that are broken or loose. Install non-slip stair treads on all your stairs if they do not have carpet. Avoid having throw rugs at the top or bottom of the stairs. Choose a carpet that does not hide the edge of the steps on the stairs. Make sure that the carpet is firmly attached to the stairs. Fix carpet that is loose or worn. What can I do on the outside of my home? Use bright outdoor lighting. Fix the edges of walkways and driveways and fix any cracks. Clear paths of anything that can make you trip, such as tools or rocks. Add color or contrast paint or tape to clearly mark and help you see anything that might make you trip as you walk through a door, such as a raised step or threshold. Trim any bushes or trees on paths to your home. Check to see if handrails are loose  or broken and that both sides of all steps have handrails. Install guardrails along the edges of any raised decks and porches. Have leaves, snow, or ice cleared regularly. Use sand, salt, or ice melter on paths if you live where there is ice and snow during the winter. Clean up any spills in your garage right away. This includes grease or oil spills. What other actions can I take? Review your medicines with your doctor. Some medicines can cause dizziness or changes in blood pressure, which increase your risk of falling. Wear shoes that: Have a low heel. Do not wear high heels. Have rubber bottoms and are closed at the toe. Feel good on your feet and fit well. Use tools that help you move around if needed. These include: Canes. Walkers. Scooters. Crutches. Ask  your doctor what else you can do to help prevent falls. This may include seeing a physical therapist to learn to do exercises to move better and get stronger. Where to find more information Centers for Disease Control and Prevention, STEADI: TonerPromos.no [employer] on Aging: BaseRingTones.pl National Institute on Aging: BaseRingTones.pl Contact a doctor if: You are afraid of falling at home. You feel weak, drowsy, or dizzy at home. You fall at home. Get help right away if you: Lose consciousness or have trouble moving after a fall. Have a fall that causes a head injury. These symptoms may be an emergency. Get help right away. Call 911. Do not wait to see if the symptoms will go away. Do not drive yourself to the hospital. This information is not intended to replace advice given to you by your health care provider. Make sure you discuss any questions you have with your health care provider. Document Revised: 05/23/2022 Document Reviewed: 05/23/2022 Elsevier Patient Education  2024 [employer].

## 2024-09-19 NOTE — Progress Notes (Signed)
 Subjective:    Patient ID: Ariel [name], female    DOB: [birth year], 78 y.o.   MRN: [id number]   Chief Complaint: medical management of chronic issues      HPI:  Ariel [name] is a 78 y.o. who identifies as a female who was assigned female at birth.   Social history: Lives with: husband Work history: retired   Water Engineer in today for follow up of the following chronic medical issues:  1. Primary hypertension No c/o chest pain, sob or headache. Does not check blood pressure at home. BP Readings from Last 3 Encounters:  07/04/24 116/68  06/25/24 116/68  03/18/24 105/60     2. Pure hypercholesterolemia Does try to watch diet and does little to no dedicated exercise. Lab Results  Component Value Date   CHOL 153 03/18/2024   HDL 47 03/18/2024   LDLCALC 81 03/18/2024   TRIG 144 03/18/2024   CHOLHDL 3.3 03/18/2024   The 10-year ASCVD risk score (Arnett DK, et al., 2019) is: 10.9%   3. Metabolic syndrome Does not check blood sugars every day. Lab Results  Component Value Date   HGBA1C 6.0 (H) 03/18/2024     4. Overactive bladder Myrbetriq  works well to keep symptoms under control  5. Class 1 obesity due to excess calories with serious comorbidity and body mass index (BMI) of 33.0 to 33.9 in adult No recent weight changes Wt Readings from Last 3 Encounters:  09/19/24 173 lb (78.5 kg)  07/04/24 174 lb (78.9 kg)  06/25/24 174 lb (78.9 kg)   BMI Readings from Last 3 Encounters:  09/19/24 29.70 kg/m  07/04/24 29.87 kg/m  06/25/24 29.87 kg/m      New complaints: None today  No Known Allergies Outpatient Encounter Medications as of 09/19/2024  Medication Sig   levocetirizine (XYZAL ) 5 MG tablet TAKE 1/2 TABLET (2.5 MG TOTAL) BY MOUTH EVERY EVENING.   mirabegron  ER (MYRBETRIQ ) 50 MG TB24 tablet Take 1 tablet (50 mg total) by mouth daily.   naproxen  (NAPROSYN ) 500 MG tablet TAKE 1 TABLET BY MOUTH 2 TIMES DAILY WITH A MEAL.   PAXLOVID, 300/100, 20  x 150 MG & 10 x 100MG  TBPK Take by mouth.   polyethylene glycol powder (GLYCOLAX /MIRALAX ) 17 GM/SCOOP powder Take 17 g by mouth daily as needed (constipation).   rosuvastatin  (CRESTOR ) 10 MG tablet TAKE 1 TABLET BY MOUTH EVERY DAY   sulfamethoxazole -trimethoprim  (BACTRIM  DS) 800-160 MG tablet Take 1 tablet by mouth 2 (two) times daily.   valsartan -hydrochlorothiazide  (DIOVAN -HCT) 160-25 MG tablet Take 0.5 tablets by mouth daily.   VENTOLIN  HFA 108 (90 Base) MCG/ACT inhaler Inhale 2 puffs into the lungs every 4 (four) hours.   Facility-Administered Encounter Medications as of 09/19/2024  Medication   methylPREDNISolone  acetate (DEPO-MEDROL ) injection 40 mg    Past Surgical History:  Procedure Laterality Date   ABDOMINAL HYSTERECTOMY  10/04/1983   BREAST BIOPSY Right 2010   CARPAL TUNNEL RELEASE Right 10/03/2005   CARPAL TUNNEL RELEASE Left 03/17/2016   Procedure: LEFT CARPAL TUNNEL RELEASE;  Surgeon: Taft FORBES Minerva, MD;  Location: [facility];  Service: Orthopedics;  Laterality: Left;   CLOSED REDUCTION FINGER WITH PERCUTANEOUS PINNING Right 06/20/2019   Procedure: RIGHT SMALL FINGER CLOSED REDUCTION PIN FIXATION;  Surgeon: Murrell Drivers, MD;  Location: [facility];  Service: Orthopedics;  Laterality: Right;    Family History  Problem Relation Age of Onset   Diabetes Mother    Cancer Father 66  colon   Alcohol abuse Brother    Cancer Brother        stomach   Lupus Brother    Breast cancer Neg Hx       Controlled substance contract: n/a     Review of Systems  Constitutional:  Negative for diaphoresis.  Eyes:  Negative for pain.  Respiratory:  Negative for shortness of breath.   Cardiovascular:  Negative for chest pain, palpitations and leg swelling.  Gastrointestinal:  Negative for abdominal pain.  Endocrine: Negative for polydipsia.  Skin:  Negative for rash.  Neurological:  Negative for dizziness, weakness and headaches.  Hematological:  Does not  bruise/bleed easily.  All other systems reviewed and are negative.      Objective:   Physical Exam Vitals and nursing note reviewed.  Constitutional:      General: She is not in acute distress.    Appearance: Normal appearance. She is well-developed.  HENT:     Head: Normocephalic.     Right Ear: Tympanic membrane normal.     Left Ear: Tympanic membrane normal.     Nose: Nose normal.     Mouth/Throat:     Mouth: Mucous membranes are moist.  Eyes:     Pupils: Pupils are equal, round, and reactive to light.  Neck:     Vascular: No carotid bruit or JVD.  Cardiovascular:     Rate and Rhythm: Normal rate and regular rhythm.     Heart sounds: Normal heart sounds.     Comments: Bil audible carotid bruits  Pulmonary:     Effort: Pulmonary effort is normal. No respiratory distress.     Breath sounds: Normal breath sounds. No wheezing or rales.  Chest:     Chest wall: No tenderness.  Abdominal:     General: Bowel sounds are normal. There is no distension or abdominal bruit.     Palpations: Abdomen is soft. There is no hepatomegaly, splenomegaly, mass or pulsatile mass.     Tenderness: There is no abdominal tenderness.  Musculoskeletal:        General: Normal range of motion.     Cervical back: Normal range of motion and neck supple.  Lymphadenopathy:     Cervical: No cervical adenopathy.  Skin:    General: Skin is warm and dry.  Neurological:     Mental Status: She is alert and oriented to person, place, and time.     Deep Tendon Reflexes: Reflexes are normal and symmetric.  Psychiatric:        Behavior: Behavior normal.        Thought Content: Thought content normal.        Judgment: Judgment normal.     BP 118/74   Pulse 78   Temp (!) 97.1 F (36.2 C) (Temporal)   Ht 5' 4 (1.626 m)   Wt 173 lb (78.5 kg)   SpO2 99%   BMI 29.70 kg/m     Hgba1c 5.4%     Assessment & Plan:  Ariel [name] comes in today with chief complaint of annual  physical  Diagnosis and orders addressed:  1. Primary hypertension Low sodium diet - CBC with Differential/Platelet - CMP14+EGFR - valsartan -hydrochlorothiazide  (DIOVAN -HCT) 160-25 MG tablet; Take 0.5 tablets by mouth daily.  Dispense: 90 tablet; Refill: 1  2. Pure hypercholesterolemia Low fat diet - Lipid panel - rosuvastatin  (CRESTOR ) 10 MG tablet; Take 1 tablet (10 mg total) by mouth daily.  Dispense: 90 tablet; Refill: 1  3. Metabolic syndrome  Continue to watch carbs in diet - Bayer DCA Hb A1c Waived - Microalbumin / creatinine urine ratio  4. Overactive bladder - mirabegron  ER (MYRBETRIQ ) 50 MG TB24 tablet; Take 1 tablet (50 mg total) by mouth daily.  Dispense: 90 tablet; Refill: 1  5. Class 1 obesity due to excess calories with serious comorbidity and body mass index (BMI) of 33.0 to 33.9 in adult Discussed diet and exercise for person with BMI >25 Will recheck weight in 3-6 months   6. bill carotid bruits Bil carotid doppler  Labs pending Health Maintenance reviewed Diet and exercise encouraged  Follow up plan: 6 months   Ariel Gladis, FNP

## 2024-09-20 ENCOUNTER — Ambulatory Visit: Payer: Self-pay | Admitting: Nurse Practitioner

## 2025-03-10 ENCOUNTER — Ambulatory Visit: Admitting: Nurse Practitioner

## 2025-07-07 ENCOUNTER — Ambulatory Visit: Payer: Self-pay
